# Patient Record
Sex: Female | Born: 1960 | Race: White | Hispanic: No | Marital: Married | Smoking: Former smoker
Health system: Southern US, Community
[De-identification: ages and names within clinical notes are randomized; demographics above are authoritative.]

## PROBLEM LIST (undated history)

## (undated) DIAGNOSIS — C719 Malignant neoplasm of brain, unspecified: Secondary | ICD-10-CM

## (undated) DIAGNOSIS — T451X5A Adverse effect of antineoplastic and immunosuppressive drugs, initial encounter: Secondary | ICD-10-CM

## (undated) DIAGNOSIS — R040 Epistaxis: Secondary | ICD-10-CM

## (undated) DIAGNOSIS — E039 Hypothyroidism, unspecified: Secondary | ICD-10-CM

## (undated) DIAGNOSIS — J340 Abscess, furuncle and carbuncle of nose: Secondary | ICD-10-CM

## (undated) DIAGNOSIS — C712 Malignant neoplasm of temporal lobe: Secondary | ICD-10-CM

## (undated) DIAGNOSIS — I998 Other disorder of circulatory system: Secondary | ICD-10-CM

## (undated) DIAGNOSIS — B59 Pneumocystosis: Secondary | ICD-10-CM

## (undated) DIAGNOSIS — Z7901 Long term (current) use of anticoagulants: Principal | ICD-10-CM

## (undated) DIAGNOSIS — I2699 Other pulmonary embolism without acute cor pulmonale: Secondary | ICD-10-CM

## (undated) DIAGNOSIS — D6959 Other secondary thrombocytopenia: Secondary | ICD-10-CM

## (undated) DIAGNOSIS — D61810 Antineoplastic chemotherapy induced pancytopenia: Secondary | ICD-10-CM

## (undated) HISTORY — DX: Other secondary thrombocytopenia: D69.59

## (undated) HISTORY — DX: Other pulmonary embolism without acute cor pulmonale: I26.99

## (undated) HISTORY — DX: Malignant neoplasm of brain, unspecified: C71.9

## (undated) HISTORY — DX: Adverse effect of antineoplastic and immunosuppressive drugs, initial encounter: T45.1X5A

## (undated) HISTORY — DX: Hypothyroidism, unspecified: E03.9

## (undated) HISTORY — DX: Malignant neoplasm of temporal lobe: C71.2

## (undated) HISTORY — DX: Epistaxis: R04.0

## (undated) HISTORY — DX: Long term (current) use of anticoagulants: Z79.01

## (undated) HISTORY — DX: Pneumocystosis: B59

## (undated) HISTORY — DX: Antineoplastic chemotherapy induced pancytopenia: D61.810

## (undated) HISTORY — PX: TUBAL LIGATION: SHX77

## (undated) HISTORY — DX: Abscess, furuncle and carbuncle of nose: J34.0

## (undated) HISTORY — DX: Other disorder of circulatory system: I99.8

---

## 2006-03-18 ENCOUNTER — Other Ambulatory Visit: Admission: RE | Admit: 2006-03-18 | Discharge: 2006-03-18 | Payer: Self-pay | Admitting: Obstetrics and Gynecology

## 2009-02-14 DIAGNOSIS — C719 Malignant neoplasm of brain, unspecified: Secondary | ICD-10-CM

## 2009-02-14 HISTORY — PX: CRANIOTOMY FOR TEMPORAL LOBECTOMY: SUR342

## 2009-02-14 HISTORY — DX: Malignant neoplasm of brain, unspecified: C71.9

## 2009-02-18 ENCOUNTER — Inpatient Hospital Stay (HOSPITAL_COMMUNITY): Admission: AD | Admit: 2009-02-18 | Discharge: 2009-02-27 | Payer: Self-pay | Admitting: Family Medicine

## 2009-02-20 ENCOUNTER — Encounter (INDEPENDENT_AMBULATORY_CARE_PROVIDER_SITE_OTHER): Payer: Self-pay | Admitting: Neurosurgery

## 2009-02-20 DIAGNOSIS — C712 Malignant neoplasm of temporal lobe: Secondary | ICD-10-CM

## 2009-02-20 HISTORY — DX: Malignant neoplasm of temporal lobe: C71.2

## 2009-03-01 ENCOUNTER — Ambulatory Visit: Admission: RE | Admit: 2009-03-01 | Discharge: 2009-04-05 | Payer: Self-pay | Admitting: Radiation Oncology

## 2009-03-18 ENCOUNTER — Ambulatory Visit: Payer: Self-pay | Admitting: Oncology

## 2009-03-20 LAB — CMP (CANCER CENTER ONLY)
Albumin: 2.7 g/dL — ABNORMAL LOW (ref 3.3–5.5)
CO2: 27 mEq/L (ref 18–33)
Calcium: 8.6 mg/dL (ref 8.0–10.3)
Glucose, Bld: 141 mg/dL — ABNORMAL HIGH (ref 73–118)
Potassium: 3.8 mEq/L (ref 3.3–4.7)
Sodium: 138 mEq/L (ref 128–145)
Total Protein: 6.5 g/dL (ref 6.4–8.1)

## 2009-03-20 LAB — CBC WITH DIFFERENTIAL (CANCER CENTER ONLY)
BASO%: 0.9 % (ref 0.0–2.0)
LYMPH#: 1.3 10*3/uL (ref 0.9–3.3)
LYMPH%: 13.1 % — ABNORMAL LOW (ref 14.0–48.0)
MONO#: 0.5 10*3/uL (ref 0.1–0.9)
Platelets: 355 10*3/uL (ref 145–400)
RDW: 14.9 % — ABNORMAL HIGH (ref 10.5–14.6)
WBC: 9.5 10*3/uL (ref 3.9–10.0)

## 2009-03-20 LAB — LACTATE DEHYDROGENASE: LDH: 193 U/L (ref 94–250)

## 2009-05-16 DIAGNOSIS — I2699 Other pulmonary embolism without acute cor pulmonale: Secondary | ICD-10-CM

## 2009-05-16 DIAGNOSIS — B59 Pneumocystosis: Secondary | ICD-10-CM

## 2009-05-16 HISTORY — DX: Pneumocystosis: B59

## 2009-05-16 HISTORY — DX: Other pulmonary embolism without acute cor pulmonale: I26.99

## 2009-05-24 ENCOUNTER — Ambulatory Visit: Admission: RE | Admit: 2009-05-24 | Discharge: 2009-07-26 | Payer: Self-pay | Admitting: Radiation Oncology

## 2009-05-29 ENCOUNTER — Ambulatory Visit: Payer: Self-pay | Admitting: Oncology

## 2009-05-29 LAB — CBC WITH DIFFERENTIAL/PLATELET
BASO%: 0.3 % (ref 0.0–2.0)
EOS%: 0 % (ref 0.0–7.0)
HGB: 11.3 g/dL — ABNORMAL LOW (ref 11.6–15.9)
MCH: 28.6 pg (ref 25.1–34.0)
MCHC: 32.8 g/dL (ref 31.5–36.0)
MONO%: 8.8 % (ref 0.0–14.0)
RBC: 3.95 10*6/uL (ref 3.70–5.45)
RDW: 20.3 % — ABNORMAL HIGH (ref 11.2–14.5)
lymph#: 2 10*3/uL (ref 0.9–3.3)
nRBC: 2 % — ABNORMAL HIGH (ref 0–0)

## 2009-05-29 LAB — COMPREHENSIVE METABOLIC PANEL
Albumin: 2.9 g/dL — ABNORMAL LOW (ref 3.5–5.2)
BUN: 21 mg/dL (ref 6–23)
Calcium: 9.1 mg/dL (ref 8.4–10.5)
Chloride: 101 mEq/L (ref 96–112)
Creatinine, Ser: 0.86 mg/dL (ref 0.40–1.20)
Glucose, Bld: 76 mg/dL (ref 70–99)
Potassium: 4.8 mEq/L (ref 3.5–5.3)

## 2009-05-29 LAB — PROTIME-INR
INR: 0.9 — ABNORMAL LOW (ref 2.00–3.50)
Protime: 10.8 Seconds (ref 10.6–13.4)

## 2009-05-30 ENCOUNTER — Ambulatory Visit (HOSPITAL_COMMUNITY): Admission: RE | Admit: 2009-05-30 | Discharge: 2009-05-30 | Payer: Self-pay | Admitting: Radiation Oncology

## 2009-05-31 ENCOUNTER — Inpatient Hospital Stay (HOSPITAL_COMMUNITY): Admission: EM | Admit: 2009-05-31 | Discharge: 2009-06-03 | Payer: Self-pay | Admitting: Emergency Medicine

## 2009-06-12 LAB — CBC WITH DIFFERENTIAL/PLATELET
Basophils Absolute: 0 10*3/uL (ref 0.0–0.1)
Eosinophils Absolute: 0 10*3/uL (ref 0.0–0.5)
HGB: 11 g/dL — ABNORMAL LOW (ref 11.6–15.9)
MONO#: 0.7 10*3/uL (ref 0.1–0.9)
NEUT#: 2.7 10*3/uL (ref 1.5–6.5)
RBC: 3.79 10*6/uL (ref 3.70–5.45)
RDW: 19.6 % — ABNORMAL HIGH (ref 11.2–14.5)
WBC: 4.7 10*3/uL (ref 3.9–10.3)
lymph#: 1.2 10*3/uL (ref 0.9–3.3)
nRBC: 3 % — ABNORMAL HIGH (ref 0–0)

## 2009-06-12 LAB — COMPREHENSIVE METABOLIC PANEL
ALT: 41 U/L — ABNORMAL HIGH (ref 0–35)
AST: 13 U/L (ref 0–37)
Calcium: 8.9 mg/dL (ref 8.4–10.5)
Chloride: 93 mEq/L — ABNORMAL LOW (ref 96–112)
Creatinine, Ser: 0.68 mg/dL (ref 0.40–1.20)
Potassium: 4.4 mEq/L (ref 3.5–5.3)
Sodium: 128 mEq/L — ABNORMAL LOW (ref 135–145)
Total Protein: 5.8 g/dL — ABNORMAL LOW (ref 6.0–8.3)

## 2009-06-12 LAB — TECHNOLOGIST REVIEW

## 2009-06-19 LAB — CBC WITH DIFFERENTIAL/PLATELET
BASO%: 0.6 % (ref 0.0–2.0)
Eosinophils Absolute: 0 10*3/uL (ref 0.0–0.5)
HCT: 29.8 % — ABNORMAL LOW (ref 34.8–46.6)
HGB: 10.2 g/dL — ABNORMAL LOW (ref 11.6–15.9)
LYMPH%: 19.9 % (ref 14.0–49.7)
MCHC: 34.3 g/dL (ref 31.5–36.0)
MONO#: 0.4 10*3/uL (ref 0.1–0.9)
NEUT#: 4.6 10*3/uL (ref 1.5–6.5)
NEUT%: 73.4 % (ref 38.4–76.8)
Platelets: 230 10*3/uL (ref 145–400)
WBC: 6.3 10*3/uL (ref 3.9–10.3)
lymph#: 1.3 10*3/uL (ref 0.9–3.3)

## 2009-06-19 LAB — COMPREHENSIVE METABOLIC PANEL
ALT: 46 U/L — ABNORMAL HIGH (ref 0–35)
CO2: 19 mEq/L (ref 19–32)
Calcium: 8.7 mg/dL (ref 8.4–10.5)
Chloride: 104 mEq/L (ref 96–112)
Creatinine, Ser: 0.63 mg/dL (ref 0.40–1.20)
Glucose, Bld: 147 mg/dL — ABNORMAL HIGH (ref 70–99)
Total Protein: 5.7 g/dL — ABNORMAL LOW (ref 6.0–8.3)

## 2009-06-19 LAB — LACTATE DEHYDROGENASE: LDH: 532 U/L — ABNORMAL HIGH (ref 94–250)

## 2009-06-25 LAB — CBC WITH DIFFERENTIAL/PLATELET
Basophils Absolute: 0 10*3/uL (ref 0.0–0.1)
Eosinophils Absolute: 0 10*3/uL (ref 0.0–0.5)
HCT: 31.5 % — ABNORMAL LOW (ref 34.8–46.6)
HGB: 10.2 g/dL — ABNORMAL LOW (ref 11.6–15.9)
MCH: 29.1 pg (ref 25.1–34.0)
MONO#: 0.4 10*3/uL (ref 0.1–0.9)
NEUT#: 4.6 10*3/uL (ref 1.5–6.5)
NEUT%: 72.4 % (ref 38.4–76.8)
RDW: 20.5 % — ABNORMAL HIGH (ref 11.2–14.5)
WBC: 6.3 10*3/uL (ref 3.9–10.3)
lymph#: 1.3 10*3/uL (ref 0.9–3.3)

## 2009-06-25 LAB — COMPREHENSIVE METABOLIC PANEL
Albumin: 3.5 g/dL (ref 3.5–5.2)
Alkaline Phosphatase: 32 U/L — ABNORMAL LOW (ref 39–117)
BUN: 14 mg/dL (ref 6–23)
CO2: 21 mEq/L (ref 19–32)
Calcium: 9.2 mg/dL (ref 8.4–10.5)
Chloride: 104 mEq/L (ref 96–112)
Glucose, Bld: 98 mg/dL (ref 70–99)
Potassium: 4.5 mEq/L (ref 3.5–5.3)
Sodium: 137 mEq/L (ref 135–145)
Total Protein: 5.9 g/dL — ABNORMAL LOW (ref 6.0–8.3)

## 2009-07-01 ENCOUNTER — Ambulatory Visit: Payer: Self-pay | Admitting: Oncology

## 2009-07-03 LAB — COMPREHENSIVE METABOLIC PANEL
ALT: 75 U/L — ABNORMAL HIGH (ref 0–35)
AST: 22 U/L (ref 0–37)
Alkaline Phosphatase: 39 U/L (ref 39–117)
Sodium: 138 mEq/L (ref 135–145)
Total Bilirubin: 0.3 mg/dL (ref 0.3–1.2)
Total Protein: 6 g/dL (ref 6.0–8.3)

## 2009-07-03 LAB — CBC WITH DIFFERENTIAL/PLATELET
BASO%: 1.4 % (ref 0.0–2.0)
Eosinophils Absolute: 0 10*3/uL (ref 0.0–0.5)
LYMPH%: 20.3 % (ref 14.0–49.7)
MCHC: 31.6 g/dL (ref 31.5–36.0)
MCV: 93.4 fL (ref 79.5–101.0)
MONO#: 0.5 10*3/uL (ref 0.1–0.9)
MONO%: 9.1 % (ref 0.0–14.0)
NEUT#: 3.6 10*3/uL (ref 1.5–6.5)
Platelets: 275 10*3/uL (ref 145–400)
RBC: 3.49 10*6/uL — ABNORMAL LOW (ref 3.70–5.45)
RDW: 20.2 % — ABNORMAL HIGH (ref 11.2–14.5)
WBC: 5.2 10*3/uL (ref 3.9–10.3)
nRBC: 12 % — ABNORMAL HIGH (ref 0–0)

## 2009-07-03 LAB — TECHNOLOGIST REVIEW

## 2009-07-10 LAB — COMPREHENSIVE METABOLIC PANEL
AST: 30 U/L (ref 0–37)
Albumin: 3.8 g/dL (ref 3.5–5.2)
Alkaline Phosphatase: 46 U/L (ref 39–117)
BUN: 21 mg/dL (ref 6–23)
Calcium: 8.8 mg/dL (ref 8.4–10.5)
Glucose, Bld: 159 mg/dL — ABNORMAL HIGH (ref 70–99)
Total Protein: 5.9 g/dL — ABNORMAL LOW (ref 6.0–8.3)

## 2009-07-10 LAB — CBC WITH DIFFERENTIAL/PLATELET
BASO%: 0.6 % (ref 0.0–2.0)
HCT: 31.8 % — ABNORMAL LOW (ref 34.8–46.6)
LYMPH%: 17.4 % (ref 14.0–49.7)
MCH: 31.7 pg (ref 25.1–34.0)
MCHC: 33.2 g/dL (ref 31.5–36.0)
MONO#: 0.4 10*3/uL (ref 0.1–0.9)
NEUT%: 74 % (ref 38.4–76.8)
Platelets: 307 10*3/uL (ref 145–400)
WBC: 5.7 10*3/uL (ref 3.9–10.3)

## 2009-07-16 LAB — COMPREHENSIVE METABOLIC PANEL
ALT: 81 U/L — ABNORMAL HIGH (ref 0–35)
Albumin: 3.9 g/dL (ref 3.5–5.2)
CO2: 20 mEq/L (ref 19–32)
Calcium: 9.1 mg/dL (ref 8.4–10.5)
Chloride: 108 mEq/L (ref 96–112)
Creatinine, Ser: 0.59 mg/dL (ref 0.40–1.20)
Potassium: 4 mEq/L (ref 3.5–5.3)
Total Protein: 6.3 g/dL (ref 6.0–8.3)

## 2009-07-16 LAB — CBC WITH DIFFERENTIAL/PLATELET
Eosinophils Absolute: 0 10*3/uL (ref 0.0–0.5)
HCT: 34.5 % — ABNORMAL LOW (ref 34.8–46.6)
LYMPH%: 13.8 % — ABNORMAL LOW (ref 14.0–49.7)
MONO#: 0.6 10*3/uL (ref 0.1–0.9)
NEUT#: 4.8 10*3/uL (ref 1.5–6.5)
Platelets: 323 10*3/uL (ref 145–400)
RBC: 3.46 10*6/uL — ABNORMAL LOW (ref 3.70–5.45)
WBC: 6.3 10*3/uL (ref 3.9–10.3)
nRBC: 5 % — ABNORMAL HIGH (ref 0–0)

## 2009-07-16 LAB — LACTATE DEHYDROGENASE: LDH: 608 U/L — ABNORMAL HIGH (ref 94–250)

## 2009-07-23 LAB — CBC WITH DIFFERENTIAL/PLATELET
BASO%: 0.7 % (ref 0.0–2.0)
Basophils Absolute: 0 10e3/uL (ref 0.0–0.1)
EOS%: 1 % (ref 0.0–7.0)
Eosinophils Absolute: 0.1 10e3/uL (ref 0.0–0.5)
HCT: 33.7 % — ABNORMAL LOW (ref 34.8–46.6)
HGB: 10.5 g/dL — ABNORMAL LOW (ref 11.6–15.9)
LYMPH%: 14.6 % (ref 14.0–49.7)
MCH: 31.3 pg (ref 25.1–34.0)
MCHC: 31.2 g/dL — ABNORMAL LOW (ref 31.5–36.0)
MCV: 100.3 fL (ref 79.5–101.0)
MONO#: 0.7 10e3/uL (ref 0.1–0.9)
MONO%: 12.5 % (ref 0.0–14.0)
NEUT#: 4.1 10e3/uL (ref 1.5–6.5)
NEUT%: 71.2 % (ref 38.4–76.8)
Platelets: 422 10e3/uL — ABNORMAL HIGH (ref 145–400)
RBC: 3.36 10e6/uL — ABNORMAL LOW (ref 3.70–5.45)
RDW: 20.2 % — ABNORMAL HIGH (ref 11.2–14.5)
WBC: 5.8 10e3/uL (ref 3.9–10.3)
lymph#: 0.8 10e3/uL — ABNORMAL LOW (ref 0.9–3.3)
nRBC: 1 % — ABNORMAL HIGH (ref 0–0)

## 2009-07-23 LAB — COMPREHENSIVE METABOLIC PANEL WITH GFR
ALT: 59 U/L — ABNORMAL HIGH (ref 0–35)
AST: 24 U/L (ref 0–37)
Albumin: 3.8 g/dL (ref 3.5–5.2)
Alkaline Phosphatase: 57 U/L (ref 39–117)
BUN: 20 mg/dL (ref 6–23)
CO2: 19 meq/L (ref 19–32)
Calcium: 9.3 mg/dL (ref 8.4–10.5)
Chloride: 110 meq/L (ref 96–112)
Creatinine, Ser: 0.57 mg/dL (ref 0.40–1.20)
Glucose, Bld: 103 mg/dL — ABNORMAL HIGH (ref 70–99)
Potassium: 4.4 meq/L (ref 3.5–5.3)
Sodium: 142 meq/L (ref 135–145)
Total Bilirubin: 0.4 mg/dL (ref 0.3–1.2)
Total Protein: 6.2 g/dL (ref 6.0–8.3)

## 2009-07-23 LAB — LACTATE DEHYDROGENASE: LDH: 508 U/L — ABNORMAL HIGH (ref 94–250)

## 2009-08-26 ENCOUNTER — Ambulatory Visit: Payer: Self-pay | Admitting: Oncology

## 2009-08-28 LAB — CBC WITH DIFFERENTIAL/PLATELET
BASO%: 0 % (ref 0.0–2.0)
EOS%: 8.4 % — ABNORMAL HIGH (ref 0.0–7.0)
MCH: 30.6 pg (ref 25.1–34.0)
MCHC: 33 g/dL (ref 31.5–36.0)
MONO#: 0.9 10*3/uL (ref 0.1–0.9)
NEUT%: 61 % (ref 38.4–76.8)
RBC: 3.29 10*6/uL — ABNORMAL LOW (ref 3.70–5.45)
WBC: 4.8 10*3/uL (ref 3.9–10.3)
lymph#: 0.6 10*3/uL — ABNORMAL LOW (ref 0.9–3.3)

## 2009-09-23 ENCOUNTER — Ambulatory Visit: Payer: Self-pay | Admitting: Oncology

## 2009-09-25 LAB — COMPREHENSIVE METABOLIC PANEL
AST: 32 U/L (ref 0–37)
Albumin: 4.2 g/dL (ref 3.5–5.2)
Alkaline Phosphatase: 65 U/L (ref 39–117)
BUN: 9 mg/dL (ref 6–23)
Potassium: 4 mEq/L (ref 3.5–5.3)
Sodium: 139 mEq/L (ref 135–145)
Total Bilirubin: 0.2 mg/dL — ABNORMAL LOW (ref 0.3–1.2)

## 2009-09-25 LAB — CBC WITH DIFFERENTIAL/PLATELET
Basophils Absolute: 0 10*3/uL (ref 0.0–0.1)
EOS%: 4.6 % (ref 0.0–7.0)
LYMPH%: 18 % (ref 14.0–49.7)
MCH: 27.3 pg (ref 25.1–34.0)
MCV: 84.6 fL (ref 79.5–101.0)
MONO%: 23.7 % — ABNORMAL HIGH (ref 0.0–14.0)
RBC: 4.03 10*6/uL (ref 3.70–5.45)
RDW: 15.8 % — ABNORMAL HIGH (ref 11.2–14.5)

## 2009-10-02 LAB — CBC WITH DIFFERENTIAL/PLATELET
Basophils Absolute: 0 10*3/uL (ref 0.0–0.1)
Eosinophils Absolute: 0.1 10*3/uL (ref 0.0–0.5)
HCT: 32.3 % — ABNORMAL LOW (ref 34.8–46.6)
LYMPH%: 23.9 % (ref 14.0–49.7)
MCV: 82.6 fL (ref 79.5–101.0)
MONO#: 0.7 10*3/uL (ref 0.1–0.9)
MONO%: 17.6 % — ABNORMAL HIGH (ref 0.0–14.0)
NEUT#: 2.2 10*3/uL (ref 1.5–6.5)
NEUT%: 55.4 % (ref 38.4–76.8)
Platelets: 343 10*3/uL (ref 145–400)
RBC: 3.91 10*6/uL (ref 3.70–5.45)

## 2009-10-08 LAB — CBC WITH DIFFERENTIAL/PLATELET
BASO%: 0.3 % (ref 0.0–2.0)
EOS%: 2.2 % (ref 0.0–7.0)
HCT: 31.9 % — ABNORMAL LOW (ref 34.8–46.6)
LYMPH%: 26.5 % (ref 14.0–49.7)
MCH: 27.5 pg (ref 25.1–34.0)
MCHC: 33.2 g/dL (ref 31.5–36.0)
MCV: 82.7 fL (ref 79.5–101.0)
MONO%: 19.3 % — ABNORMAL HIGH (ref 0.0–14.0)
NEUT%: 51.7 % (ref 38.4–76.8)
Platelets: 509 10*3/uL — ABNORMAL HIGH (ref 145–400)
RBC: 3.86 10*6/uL (ref 3.70–5.45)
WBC: 5.3 10*3/uL (ref 3.9–10.3)

## 2009-10-21 ENCOUNTER — Ambulatory Visit: Payer: Self-pay | Admitting: Oncology

## 2009-10-23 LAB — COMPREHENSIVE METABOLIC PANEL
ALT: 17 U/L (ref 0–35)
Albumin: 4.3 g/dL (ref 3.5–5.2)
Alkaline Phosphatase: 64 U/L (ref 39–117)
Potassium: 4.4 mEq/L (ref 3.5–5.3)
Sodium: 135 mEq/L (ref 135–145)
Total Bilirubin: 0.3 mg/dL (ref 0.3–1.2)
Total Protein: 6.8 g/dL (ref 6.0–8.3)

## 2009-10-23 LAB — CBC WITH DIFFERENTIAL/PLATELET
EOS%: 2.7 % (ref 0.0–7.0)
Eosinophils Absolute: 0.1 10*3/uL (ref 0.0–0.5)
HGB: 10.3 g/dL — ABNORMAL LOW (ref 11.6–15.9)
MCH: 26.5 pg (ref 25.1–34.0)
MCV: 85.6 fL (ref 79.5–101.0)
MONO%: 25.6 % — ABNORMAL HIGH (ref 0.0–14.0)
NEUT#: 2.2 10*3/uL (ref 1.5–6.5)
RBC: 3.89 10*6/uL (ref 3.70–5.45)
RDW: 21 % — ABNORMAL HIGH (ref 11.2–14.5)
lymph#: 0.7 10*3/uL — ABNORMAL LOW (ref 0.9–3.3)
nRBC: 0 % (ref 0–0)

## 2009-10-31 LAB — CBC WITH DIFFERENTIAL/PLATELET
BASO%: 0.3 % (ref 0.0–2.0)
Basophils Absolute: 0 10*3/uL (ref 0.0–0.1)
EOS%: 3.5 % (ref 0.0–7.0)
HCT: 31.4 % — ABNORMAL LOW (ref 34.8–46.6)
HGB: 9.6 g/dL — ABNORMAL LOW (ref 11.6–15.9)
MCH: 26.4 pg (ref 25.1–34.0)
MCHC: 30.6 g/dL — ABNORMAL LOW (ref 31.5–36.0)
MCV: 86.3 fL (ref 79.5–101.0)
MONO%: 26.7 % — ABNORMAL HIGH (ref 0.0–14.0)
NEUT%: 53.9 % (ref 38.4–76.8)
lymph#: 0.6 10*3/uL — ABNORMAL LOW (ref 0.9–3.3)

## 2009-11-14 LAB — TSH: TSH: 0.126 u[IU]/mL — ABNORMAL LOW (ref 0.350–4.500)

## 2009-11-14 LAB — CBC WITH DIFFERENTIAL/PLATELET
BASO%: 0.3 % (ref 0.0–2.0)
EOS%: 3.3 % (ref 0.0–7.0)
HCT: 32 % — ABNORMAL LOW (ref 34.8–46.6)
LYMPH%: 25 % (ref 14.0–49.7)
MCH: 27.1 pg (ref 25.1–34.0)
MCHC: 31.3 g/dL — ABNORMAL LOW (ref 31.5–36.0)
MCV: 86.7 fL (ref 79.5–101.0)
MONO%: 17 % — ABNORMAL HIGH (ref 0.0–14.0)
NEUT%: 54.4 % (ref 38.4–76.8)
Platelets: 416 10*3/uL — ABNORMAL HIGH (ref 145–400)
RBC: 3.69 10*6/uL — ABNORMAL LOW (ref 3.70–5.45)
WBC: 3.6 10*3/uL — ABNORMAL LOW (ref 3.9–10.3)

## 2009-11-19 LAB — COMPREHENSIVE METABOLIC PANEL
ALT: 20 U/L (ref 0–35)
AST: 22 U/L (ref 0–37)
Albumin: 4.1 g/dL (ref 3.5–5.2)
Alkaline Phosphatase: 61 U/L (ref 39–117)
Glucose, Bld: 86 mg/dL (ref 70–99)
Potassium: 4.3 mEq/L (ref 3.5–5.3)
Sodium: 141 mEq/L (ref 135–145)
Total Bilirubin: 0.4 mg/dL (ref 0.3–1.2)
Total Protein: 6.2 g/dL (ref 6.0–8.3)

## 2009-11-19 LAB — MORPHOLOGY: PLT EST: ADEQUATE

## 2009-11-19 LAB — CBC WITH DIFFERENTIAL/PLATELET
Basophils Absolute: 0 10*3/uL (ref 0.0–0.1)
EOS%: 5.1 % (ref 0.0–7.0)
HGB: 10.1 g/dL — ABNORMAL LOW (ref 11.6–15.9)
MCH: 27.2 pg (ref 25.1–34.0)
MCV: 87.3 fL (ref 79.5–101.0)
MONO%: 30.2 % — ABNORMAL HIGH (ref 0.0–14.0)
NEUT#: 1.5 10*3/uL (ref 1.5–6.5)
RBC: 3.71 10*6/uL (ref 3.70–5.45)
RDW: 21.6 % — ABNORMAL HIGH (ref 11.2–14.5)
lymph#: 0.5 10*3/uL — ABNORMAL LOW (ref 0.9–3.3)
nRBC: 0 % (ref 0–0)

## 2009-11-26 ENCOUNTER — Ambulatory Visit: Payer: Self-pay | Admitting: Oncology

## 2009-11-28 LAB — CBC WITH DIFFERENTIAL/PLATELET
BASO%: 0.5 % (ref 0.0–2.0)
EOS%: 4.4 % (ref 0.0–7.0)
HCT: 33.7 % — ABNORMAL LOW (ref 34.8–46.6)
LYMPH%: 23.9 % (ref 14.0–49.7)
MCH: 28.2 pg (ref 25.1–34.0)
MCHC: 31.2 g/dL — ABNORMAL LOW (ref 31.5–36.0)
MCV: 90.3 fL (ref 79.5–101.0)
MONO#: 0.7 10*3/uL (ref 0.1–0.9)
MONO%: 17.9 % — ABNORMAL HIGH (ref 0.0–14.0)
NEUT%: 53.3 % (ref 38.4–76.8)
Platelets: 358 10*3/uL (ref 145–400)
RBC: 3.73 10*6/uL (ref 3.70–5.45)
WBC: 3.6 10*3/uL — ABNORMAL LOW (ref 3.9–10.3)
nRBC: 0 % (ref 0–0)

## 2009-12-12 LAB — CBC WITH DIFFERENTIAL/PLATELET
EOS%: 3.5 % (ref 0.0–7.0)
Eosinophils Absolute: 0.1 10*3/uL (ref 0.0–0.5)
LYMPH%: 26 % (ref 14.0–49.7)
MCH: 29.2 pg (ref 25.1–34.0)
MCV: 95 fL (ref 79.5–101.0)
MONO%: 19.7 % — ABNORMAL HIGH (ref 0.0–14.0)
NEUT#: 1.3 10*3/uL — ABNORMAL LOW (ref 1.5–6.5)
Platelets: 362 10*3/uL (ref 145–400)
RBC: 3.63 10*6/uL — ABNORMAL LOW (ref 3.70–5.45)
RDW: 17.8 % — ABNORMAL HIGH (ref 11.2–14.5)
nRBC: 0 % (ref 0–0)

## 2009-12-24 LAB — CBC WITH DIFFERENTIAL/PLATELET
BASO%: 0.3 % (ref 0.0–2.0)
EOS%: 4 % (ref 0.0–7.0)
MCH: 30.2 pg (ref 25.1–34.0)
MCHC: 31.8 g/dL (ref 31.5–36.0)
MCV: 94.9 fL (ref 79.5–101.0)
MONO%: 21.9 % — ABNORMAL HIGH (ref 0.0–14.0)
RBC: 3.51 10*6/uL — ABNORMAL LOW (ref 3.70–5.45)
RDW: 16.6 % — ABNORMAL HIGH (ref 11.2–14.5)

## 2009-12-24 LAB — COMPREHENSIVE METABOLIC PANEL
AST: 21 U/L (ref 0–37)
Albumin: 4.3 g/dL (ref 3.5–5.2)
Alkaline Phosphatase: 67 U/L (ref 39–117)
BUN: 13 mg/dL (ref 6–23)
Potassium: 4.2 mEq/L (ref 3.5–5.3)
Sodium: 138 mEq/L (ref 135–145)
Total Protein: 6.5 g/dL (ref 6.0–8.3)

## 2009-12-26 ENCOUNTER — Ambulatory Visit: Payer: Self-pay | Admitting: Oncology

## 2009-12-31 LAB — CBC WITH DIFFERENTIAL/PLATELET
BASO%: 0.3 % (ref 0.0–2.0)
HCT: 32.5 % — ABNORMAL LOW (ref 34.8–46.6)
LYMPH%: 33.7 % (ref 14.0–49.7)
MCHC: 32.3 g/dL (ref 31.5–36.0)
MONO#: 0.7 10*3/uL (ref 0.1–0.9)
NEUT%: 43.5 % (ref 38.4–76.8)
Platelets: 284 10*3/uL (ref 145–400)
WBC: 3.5 10*3/uL — ABNORMAL LOW (ref 3.9–10.3)

## 2010-01-09 LAB — CBC WITH DIFFERENTIAL/PLATELET
BASO%: 0.6 % (ref 0.0–2.0)
EOS%: 2.8 % (ref 0.0–7.0)
HCT: 34.9 % (ref 34.8–46.6)
LYMPH%: 29.6 % (ref 14.0–49.7)
MCH: 31.4 pg (ref 25.1–34.0)
MCHC: 32.1 g/dL (ref 31.5–36.0)
MCV: 97.8 fL (ref 79.5–101.0)
MONO%: 20.4 % — ABNORMAL HIGH (ref 0.0–14.0)
NEUT%: 46.6 % (ref 38.4–76.8)
Platelets: 266 10*3/uL (ref 145–400)

## 2010-01-21 LAB — COMPREHENSIVE METABOLIC PANEL
ALT: 17 U/L (ref 0–35)
Albumin: 4.5 g/dL (ref 3.5–5.2)
CO2: 21 mEq/L (ref 19–32)
Chloride: 103 mEq/L (ref 96–112)
Potassium: 4.6 mEq/L (ref 3.5–5.3)
Sodium: 138 mEq/L (ref 135–145)
Total Bilirubin: 0.5 mg/dL (ref 0.3–1.2)
Total Protein: 7 g/dL (ref 6.0–8.3)

## 2010-01-21 LAB — CBC WITH DIFFERENTIAL/PLATELET
BASO%: 0.3 % (ref 0.0–2.0)
LYMPH%: 26.2 % (ref 14.0–49.7)
MCHC: 33.7 g/dL (ref 31.5–36.0)
MONO#: 0.8 10*3/uL (ref 0.1–0.9)
NEUT#: 2.2 10*3/uL (ref 1.5–6.5)
RBC: 3.32 10*6/uL — ABNORMAL LOW (ref 3.70–5.45)
RDW: 17.7 % — ABNORMAL HIGH (ref 11.2–14.5)
WBC: 4.4 10*3/uL (ref 3.9–10.3)
lymph#: 1.1 10*3/uL (ref 0.9–3.3)

## 2010-01-21 LAB — LACTATE DEHYDROGENASE: LDH: 250 U/L (ref 94–250)

## 2010-02-04 ENCOUNTER — Ambulatory Visit: Payer: Self-pay | Admitting: Oncology

## 2010-02-06 LAB — CBC WITH DIFFERENTIAL/PLATELET
BASO%: 0.6 % (ref 0.0–2.0)
LYMPH%: 25.9 % (ref 14.0–49.7)
MCHC: 32.1 g/dL (ref 31.5–36.0)
MCV: 104.3 fL — ABNORMAL HIGH (ref 79.5–101.0)
MONO%: 13.8 % (ref 0.0–14.0)
Platelets: 298 10*3/uL (ref 145–400)
RBC: 3.26 10*6/uL — ABNORMAL LOW (ref 3.70–5.45)
RDW: 15.3 % — ABNORMAL HIGH (ref 11.2–14.5)
WBC: 3.6 10*3/uL — ABNORMAL LOW (ref 3.9–10.3)
nRBC: 0 % (ref 0–0)

## 2010-02-20 LAB — CBC WITH DIFFERENTIAL/PLATELET
BASO%: 0.2 % (ref 0.0–2.0)
Eosinophils Absolute: 0.1 10*3/uL (ref 0.0–0.5)
HCT: 34 % — ABNORMAL LOW (ref 34.8–46.6)
HGB: 11.2 g/dL — ABNORMAL LOW (ref 11.6–15.9)
LYMPH%: 31.9 % (ref 14.0–49.7)
MCHC: 32.9 g/dL (ref 31.5–36.0)
MONO#: 0.7 10*3/uL (ref 0.1–0.9)
NEUT#: 2 10*3/uL (ref 1.5–6.5)
NEUT%: 47.6 % (ref 38.4–76.8)
Platelets: 249 10*3/uL (ref 145–400)
WBC: 4.1 10*3/uL (ref 3.9–10.3)
lymph#: 1.3 10*3/uL (ref 0.9–3.3)

## 2010-02-20 LAB — UA PROTEIN, DIPSTICK - CHCC: Protein, Urine: NEGATIVE mg/dL

## 2010-02-25 LAB — COMPREHENSIVE METABOLIC PANEL
ALT: 16 U/L (ref 0–35)
AST: 17 U/L (ref 0–37)
Albumin: 4.4 g/dL (ref 3.5–5.2)
BUN: 13 mg/dL (ref 6–23)
CO2: 22 mEq/L (ref 19–32)
Calcium: 9.6 mg/dL (ref 8.4–10.5)
Chloride: 107 mEq/L (ref 96–112)
Creatinine, Ser: 0.59 mg/dL (ref 0.40–1.20)
Potassium: 4.1 mEq/L (ref 3.5–5.3)

## 2010-02-25 LAB — LACTATE DEHYDROGENASE: LDH: 247 U/L (ref 94–250)

## 2010-03-06 ENCOUNTER — Ambulatory Visit: Payer: Self-pay | Admitting: Oncology

## 2010-03-06 LAB — CBC WITH DIFFERENTIAL/PLATELET
BASO%: 0.5 % (ref 0.0–2.0)
LYMPH%: 36.8 % (ref 14.0–49.7)
MCHC: 33.6 g/dL (ref 31.5–36.0)
MCV: 105.8 fL — ABNORMAL HIGH (ref 79.5–101.0)
MONO%: 18.9 % — ABNORMAL HIGH (ref 0.0–14.0)
Platelets: 278 10*3/uL (ref 145–400)
RBC: 3.17 10*6/uL — ABNORMAL LOW (ref 3.70–5.45)
RDW: 15.4 % — ABNORMAL HIGH (ref 11.2–14.5)
WBC: 3.4 10*3/uL — ABNORMAL LOW (ref 3.9–10.3)

## 2010-03-20 LAB — CBC WITH DIFFERENTIAL/PLATELET
Basophils Absolute: 0 10*3/uL (ref 0.0–0.1)
Eosinophils Absolute: 0.1 10*3/uL (ref 0.0–0.5)
HCT: 33.4 % — ABNORMAL LOW (ref 34.8–46.6)
HGB: 11 g/dL — ABNORMAL LOW (ref 11.6–15.9)
MCH: 34 pg (ref 25.1–34.0)
MONO#: 0.7 10*3/uL (ref 0.1–0.9)
NEUT#: 1.6 10*3/uL (ref 1.5–6.5)
NEUT%: 41.6 % (ref 38.4–76.8)
RDW: 14.2 % (ref 11.2–14.5)
lymph#: 1.4 10*3/uL (ref 0.9–3.3)

## 2010-03-20 LAB — UA PROTEIN, DIPSTICK - CHCC: Protein, Urine: NEGATIVE mg/dL

## 2010-03-25 LAB — CBC WITH DIFFERENTIAL/PLATELET
BASO%: 0.3 % (ref 0.0–2.0)
Eosinophils Absolute: 0.1 10*3/uL (ref 0.0–0.5)
HCT: 33.2 % — ABNORMAL LOW (ref 34.8–46.6)
HGB: 11.3 g/dL — ABNORMAL LOW (ref 11.6–15.9)
LYMPH%: 32.4 % (ref 14.0–49.7)
MONO#: 0.6 10*3/uL (ref 0.1–0.9)
NEUT#: 1.9 10*3/uL (ref 1.5–6.5)
NEUT%: 49.4 % (ref 38.4–76.8)
Platelets: 303 10*3/uL (ref 145–400)
WBC: 3.9 10*3/uL (ref 3.9–10.3)
lymph#: 1.3 10*3/uL (ref 0.9–3.3)

## 2010-03-25 LAB — COMPREHENSIVE METABOLIC PANEL
ALT: 17 U/L (ref 0–35)
CO2: 22 mEq/L (ref 19–32)
Calcium: 9.5 mg/dL (ref 8.4–10.5)
Chloride: 105 mEq/L (ref 96–112)
Creatinine, Ser: 0.58 mg/dL (ref 0.40–1.20)
Glucose, Bld: 105 mg/dL — ABNORMAL HIGH (ref 70–99)
Total Bilirubin: 0.5 mg/dL (ref 0.3–1.2)
Total Protein: 7.4 g/dL (ref 6.0–8.3)

## 2010-03-25 LAB — LACTATE DEHYDROGENASE: LDH: 239 U/L (ref 94–250)

## 2010-04-03 LAB — COMPREHENSIVE METABOLIC PANEL
ALT: 15 U/L (ref 0–35)
AST: 12 U/L (ref 0–37)
Albumin: 3.9 g/dL (ref 3.5–5.2)
Alkaline Phosphatase: 57 U/L (ref 39–117)
Calcium: 8.6 mg/dL (ref 8.4–10.5)
Chloride: 104 mEq/L (ref 96–112)
Potassium: 4.1 mEq/L (ref 3.5–5.3)
Sodium: 136 mEq/L (ref 135–145)
Total Protein: 6.2 g/dL (ref 6.0–8.3)

## 2010-04-03 LAB — CBC WITH DIFFERENTIAL/PLATELET
BASO%: 0.6 % (ref 0.0–2.0)
EOS%: 2.2 % (ref 0.0–7.0)
HCT: 32.5 % — ABNORMAL LOW (ref 34.8–46.6)
HGB: 10.8 g/dL — ABNORMAL LOW (ref 11.6–15.9)
MCH: 34.4 pg — ABNORMAL HIGH (ref 25.1–34.0)
MCHC: 33.2 g/dL (ref 31.5–36.0)
MONO#: 0.6 10*3/uL (ref 0.1–0.9)
RDW: 14.3 % (ref 11.2–14.5)
WBC: 3.6 10*3/uL — ABNORMAL LOW (ref 3.9–10.3)
lymph#: 1.3 10*3/uL (ref 0.9–3.3)

## 2010-04-07 IMAGING — CT CT HEAD W/O CM
1 series · 16 of 30 positions shown, 20 images · non-contrast
Comparison: 02/25/2009

CLINICAL DATA: Weakness and hypotension.  History of brain tumor
resection.

CT HEAD WITHOUT CONTRAST
TECHNIQUE: Contiguous axial images were obtained from the base of
the skull through the vertex without contrast.

[Series 2: head_seq 4.5 h37s st · axial · 0.43mm/px · z∈[-111,+15]mm · 16 of 32 slices shown, 20 images]
[im 2/32  brain]
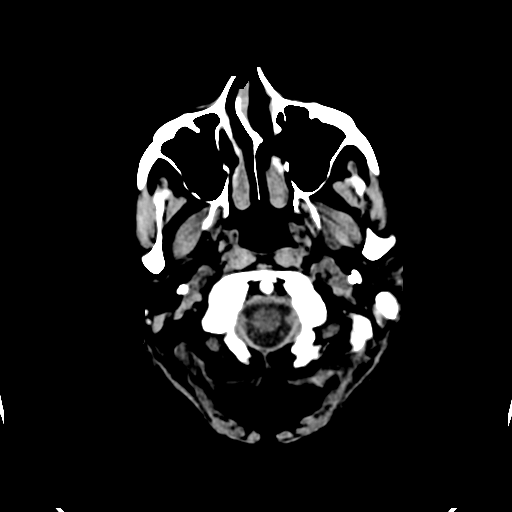
[im 2/32  bone]
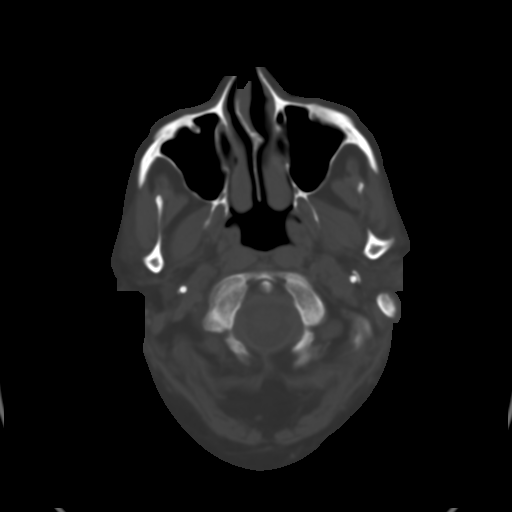
[im 4/32  brain]
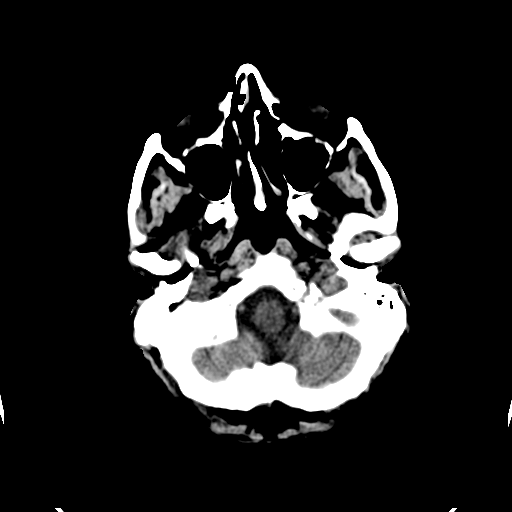
[im 6/32  brain]
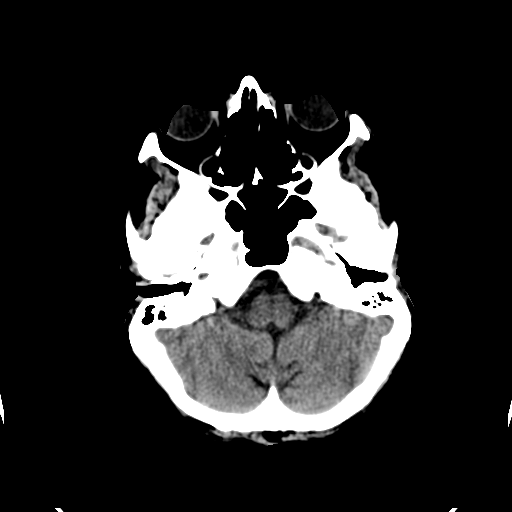
[im 8/32  brain]
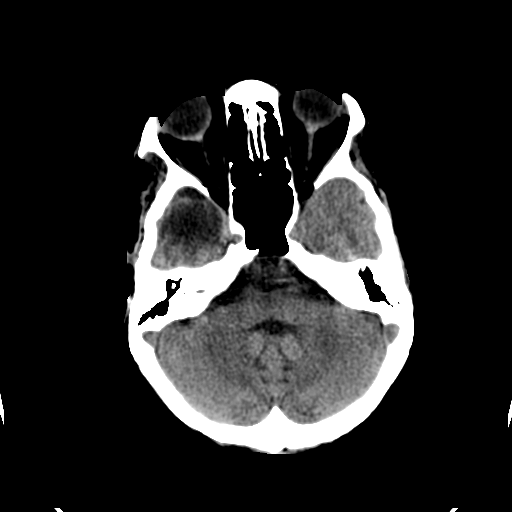
[im 9/32  brain]
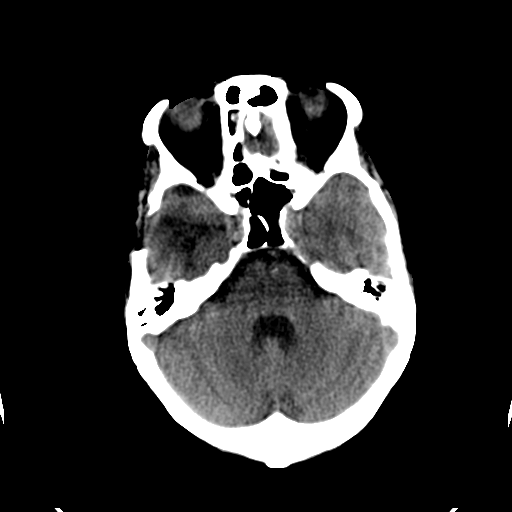
[im 9/32  bone]
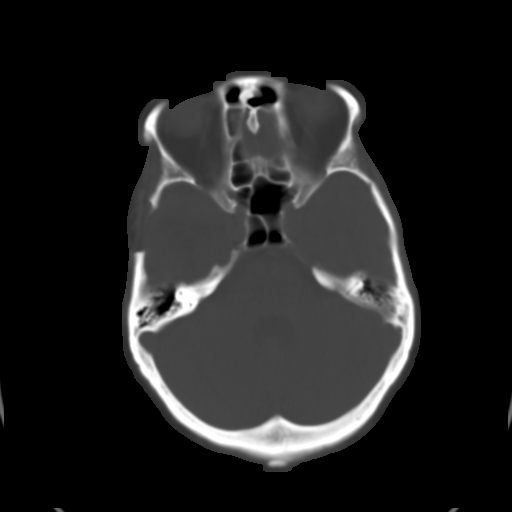
[im 11/32  brain]
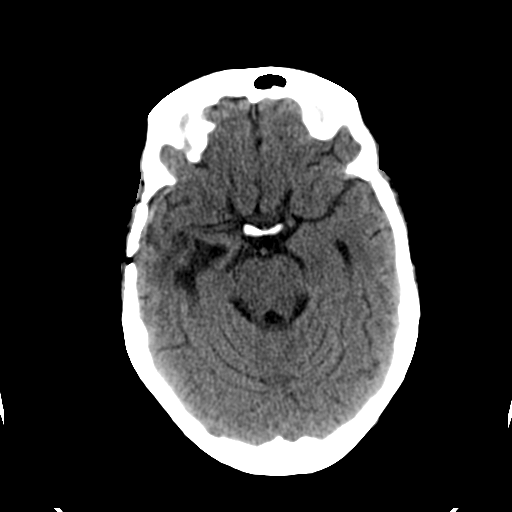
[im 13/32  brain]
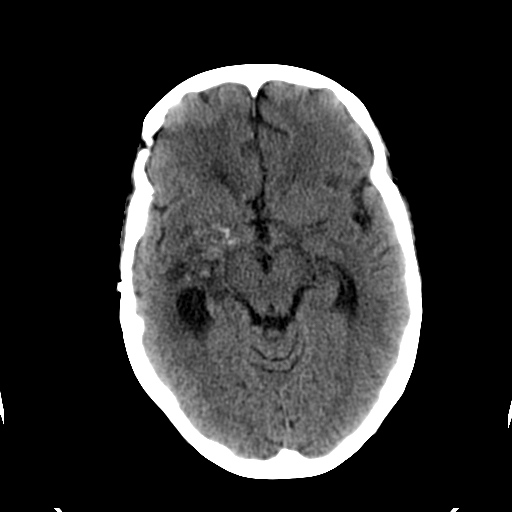
[im 15/32  brain]
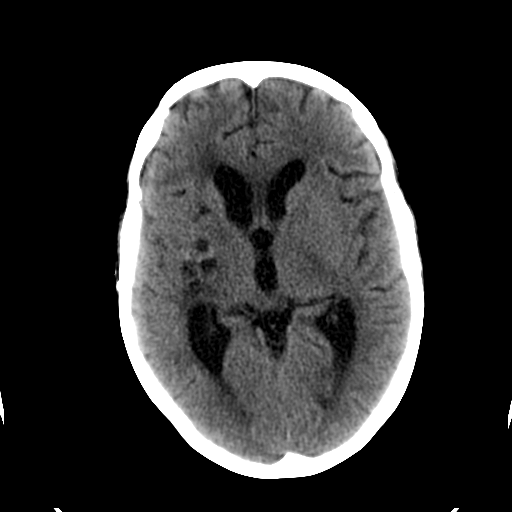
[im 17/32  brain]
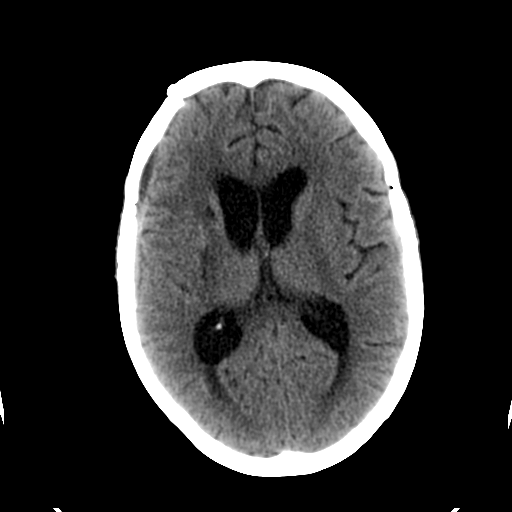
[im 17/32  bone]
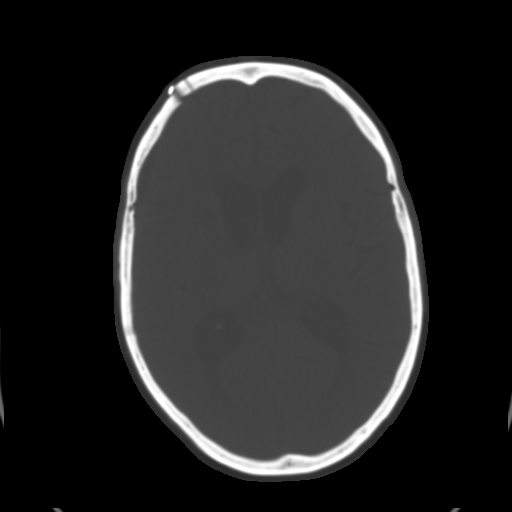
[im 19/32  brain]
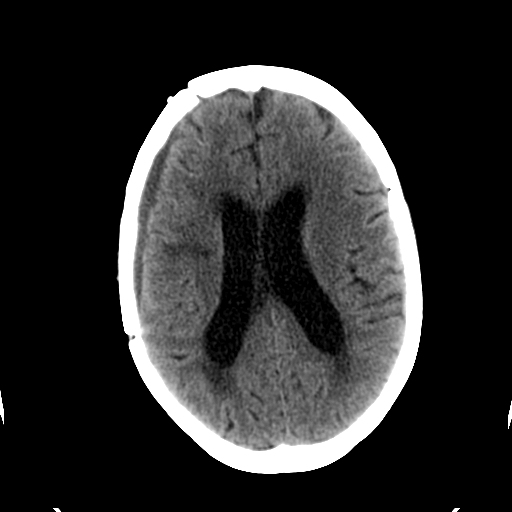
[im 21/32  brain]
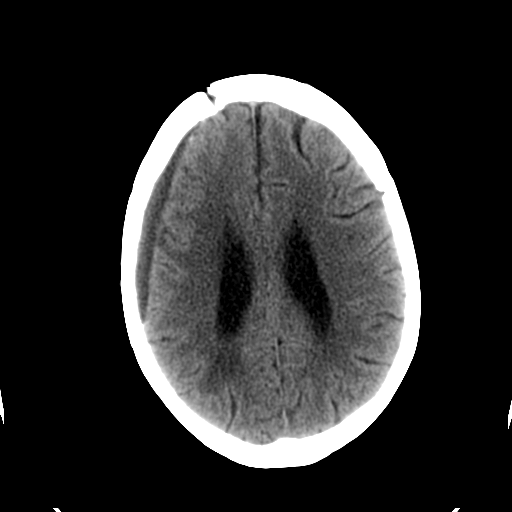
[im 23/32  brain]
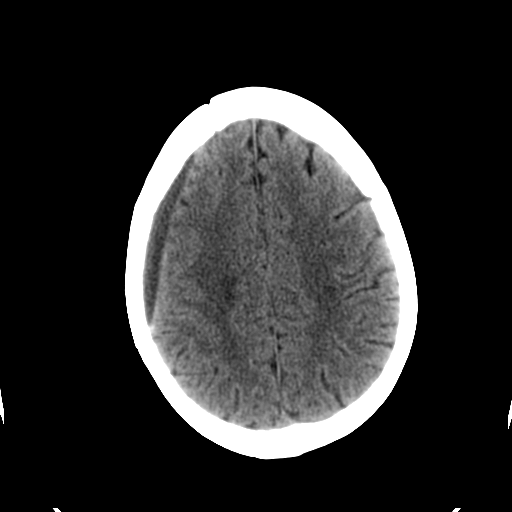
[im 24/32  brain]
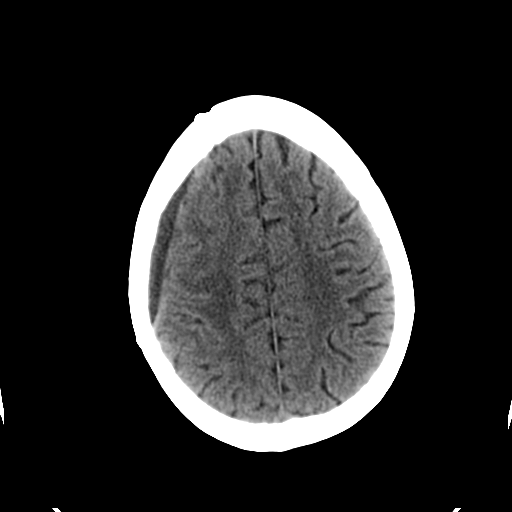
[im 24/32  bone]
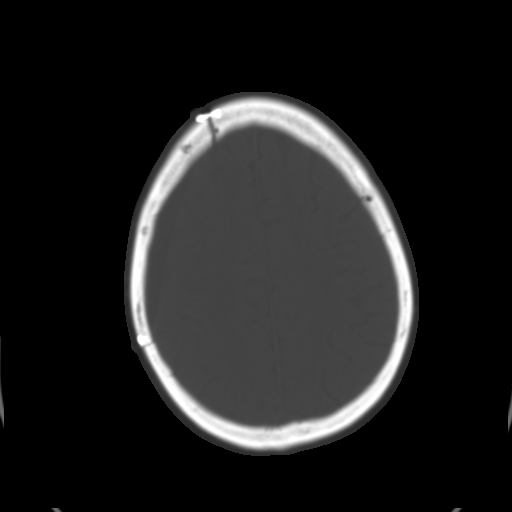
[im 26/32  brain]
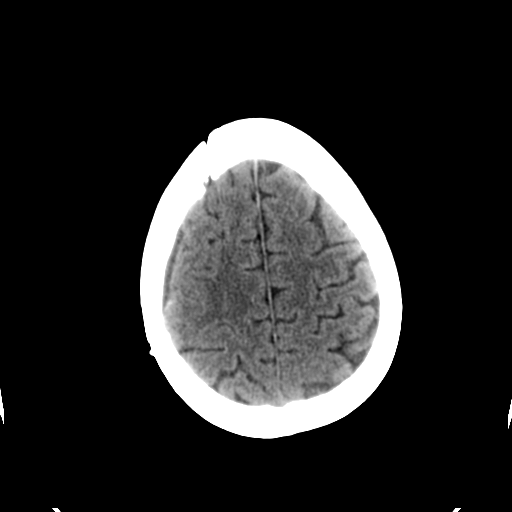
[im 28/32  brain]
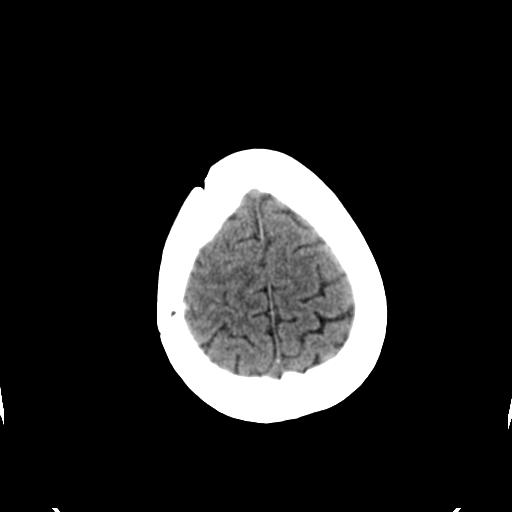
[im 30/32  brain]
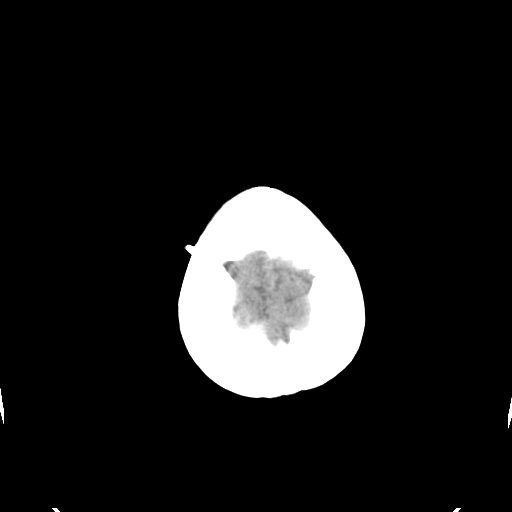

[16 of 30 positions shown; findings below may reference images not displayed]

FINDINGS: Right frontoparietal temporal craniotomy again identified
with a small chronic right subdural postoperative collection
measuring 7 mm.
Encephalomalacia within the right temporal lobe noted.
Heterogeneity with cystic spaces in the right basil ganglia is
again noted measuring 2.1 x 2.9 cm (image 13) but appears slightly
decreased in size since the prior study.

Mild ventriculomegaly is noted.
No evidence of midline shift, hemorrhage, acute infarction, or new
mass lesion.
No acute bony abnormalities are identified.
IMPRESSION: Postoperative changes as described with slightly decreased size of
complex solid and cystic area within the right basil ganglia.

Mild ventriculomegaly, probably not clinically significant.

No evidence of other acute intracranial abnormality.

## 2010-04-07 IMAGING — CR DG CHEST 1V PORT
1 series · 1 of 1 positions shown · non-contrast
Comparison: 05/30/2009.

CLINICAL DATA: Syncope.

PORTABLE CHEST - 1 VIEW

[view not recorded]
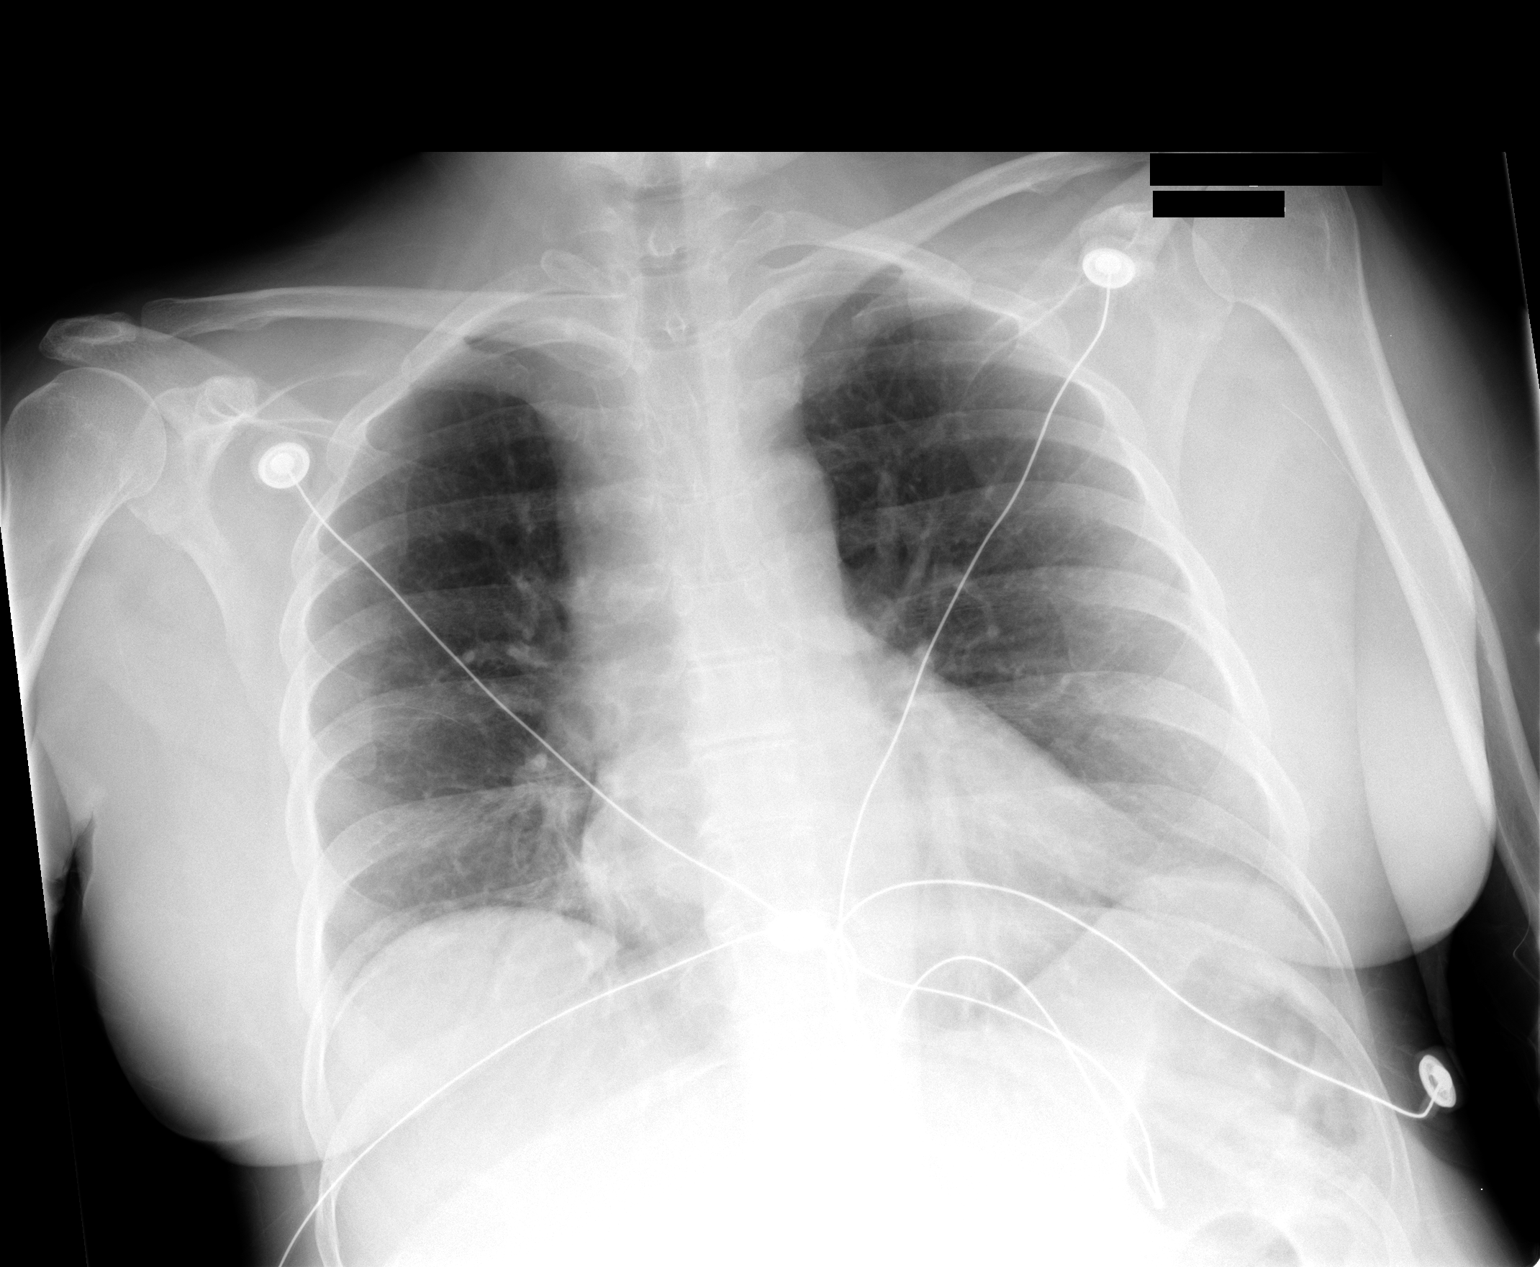

[1 of 1 positions shown; findings below may reference images not displayed]

FINDINGS: Mild right lower lobe atelectasis/infiltrate, increased
from the prior study.  There is no heart failure or pneumonia.
There is no effusion.

Left arm PICC line tip is unchanged with  the tip overlying the
left axillary vein.
IMPRESSION: Mild right lower lobe atelectasis/infiltrate.

PICC line in the left axillary vein.

## 2010-04-08 IMAGING — US US ABDOMEN COMPLETE
1 series · 14 of 25 positions shown · non-contrast
Comparison: None

CLINICAL DATA: Elevated LFTs.

ABDOMINAL ULTRASOUND COMPLETE

[Series 1: us abdomen complete · 0.30mm/px · 14 of 81 slices shown]
[im 1/81]
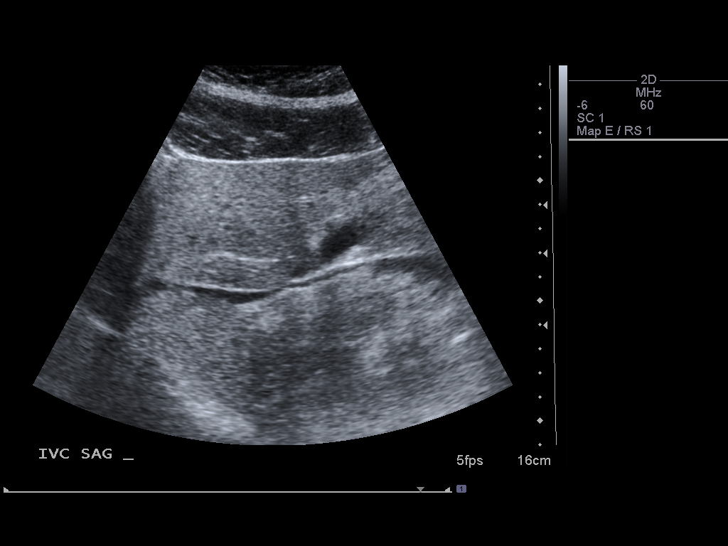
[im 7/81]
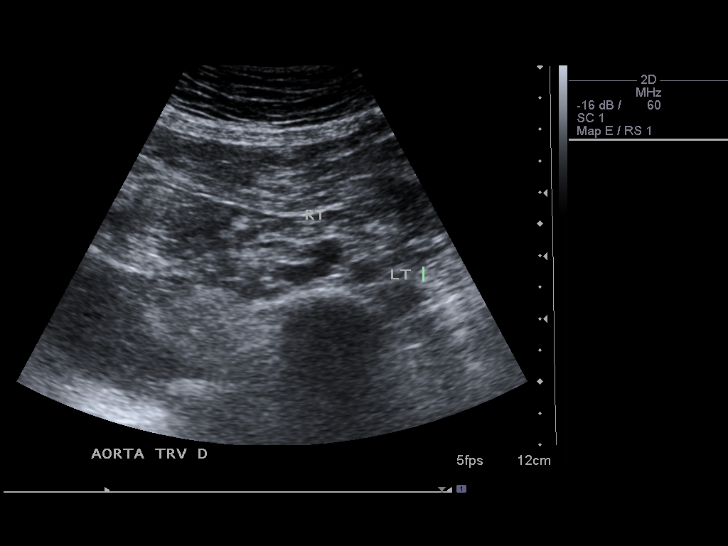
[im 14/81]
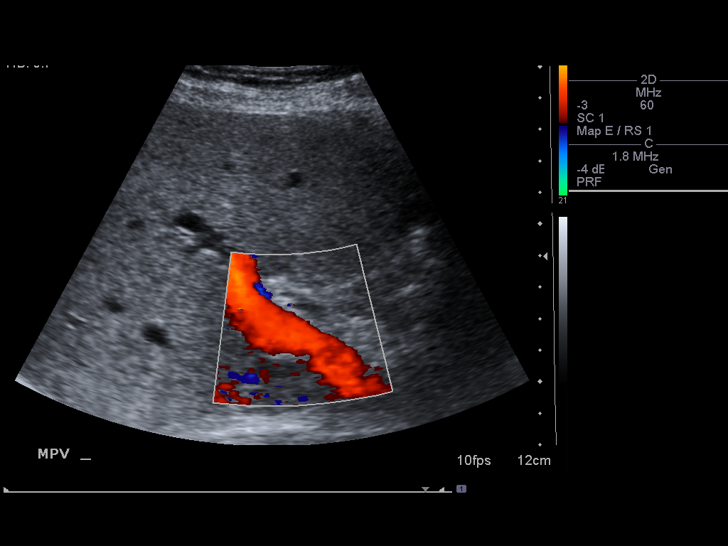
[im 21/81]
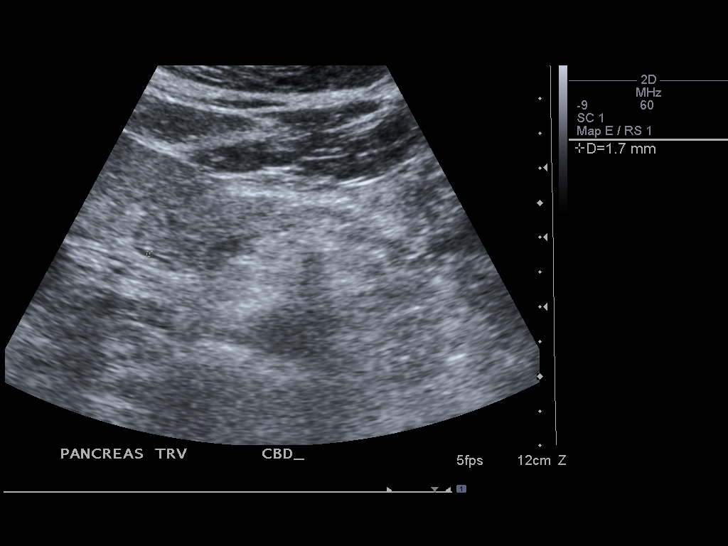
[im 27/81]
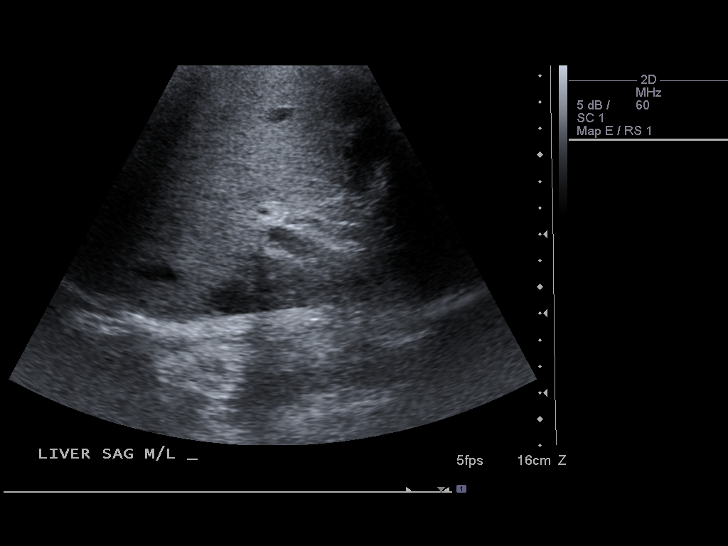
[im 31/81]
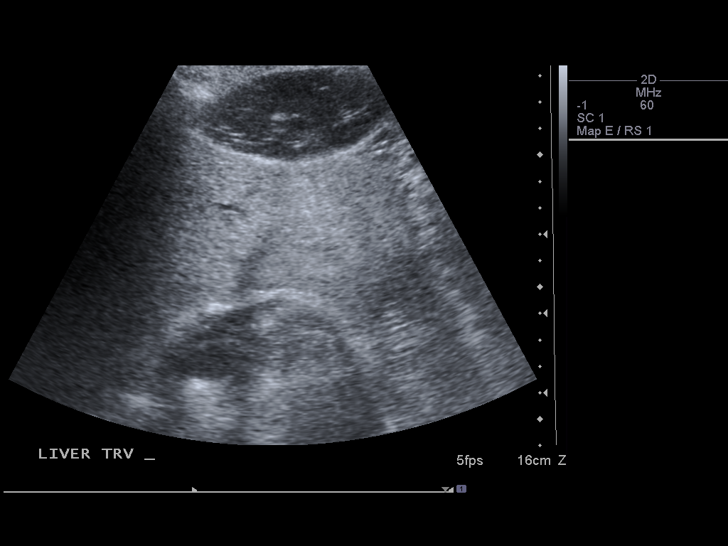
[im 37/81]
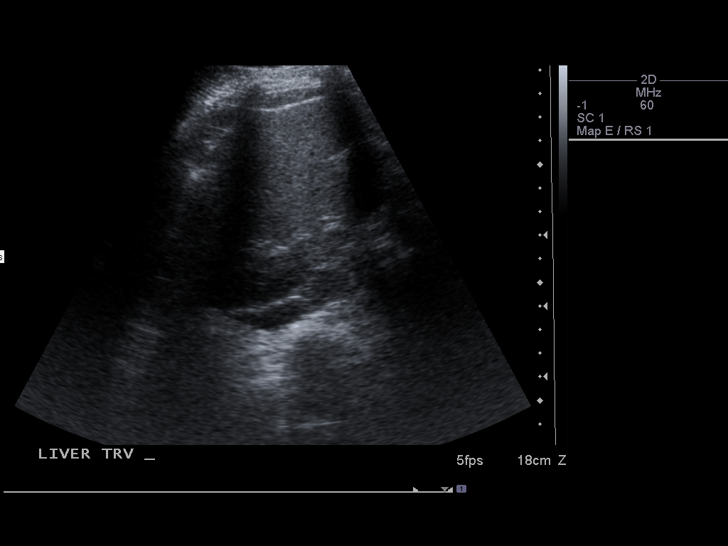
[im 44/81]
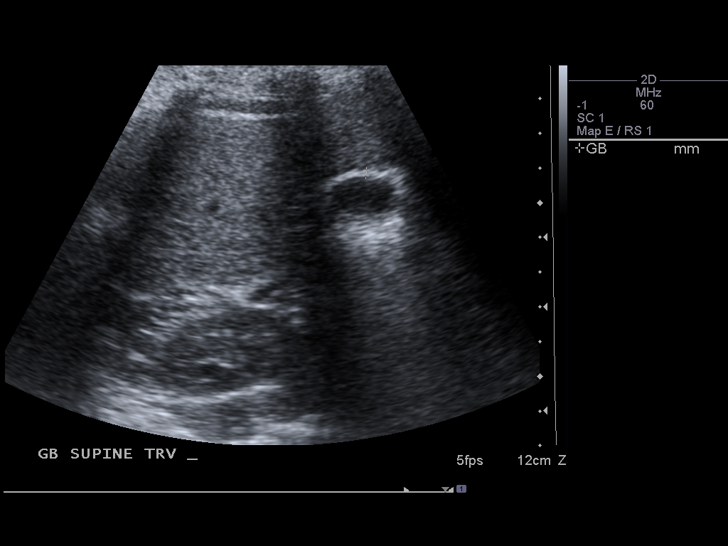
[im 51/81]
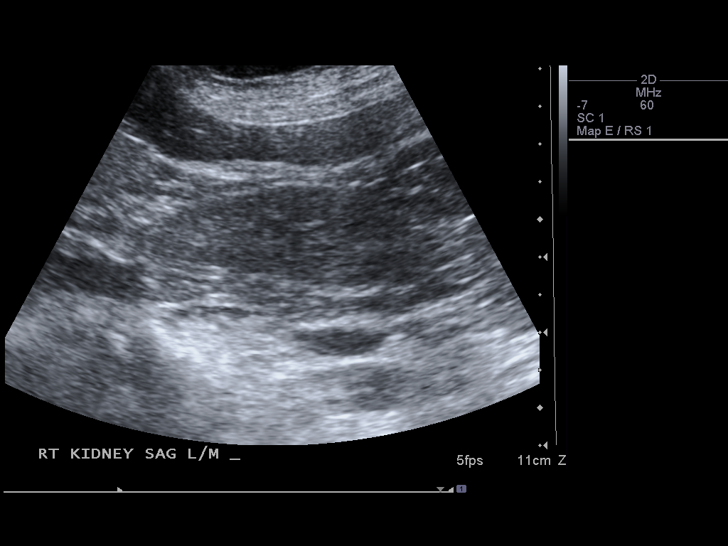
[im 54/81]
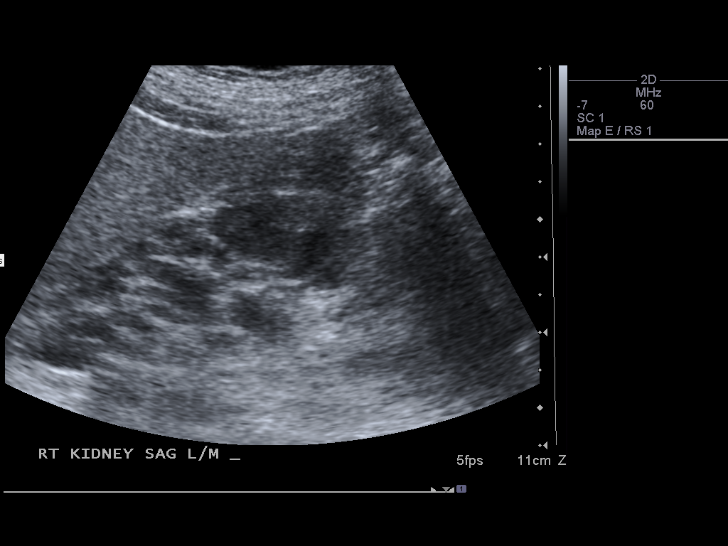
[im 61/81]
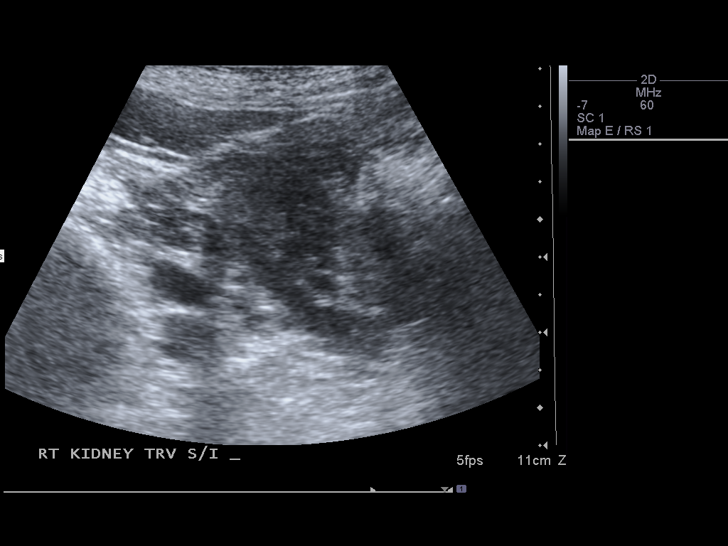
[im 67/81]
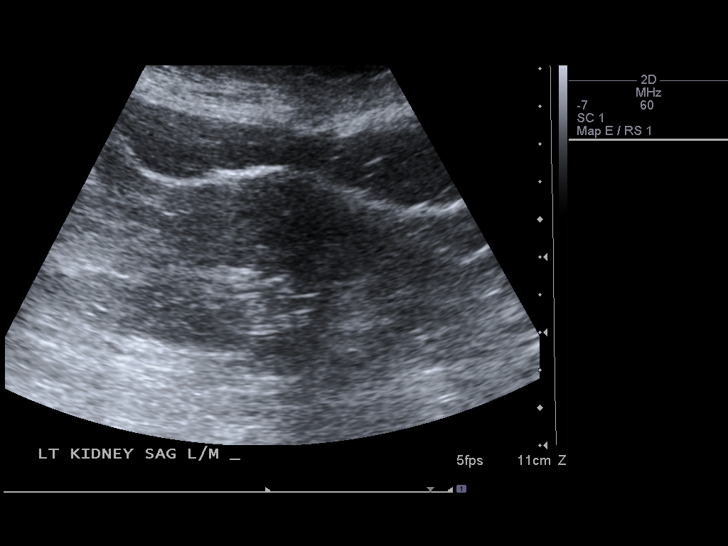
[im 74/81]
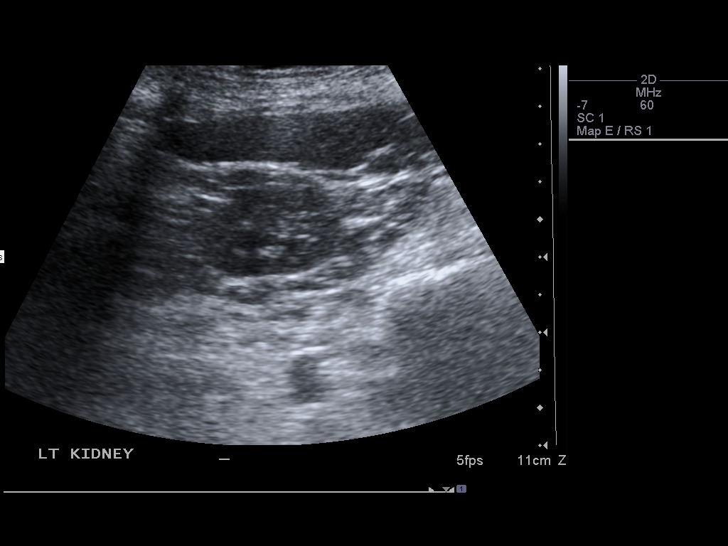
[im 81/81]
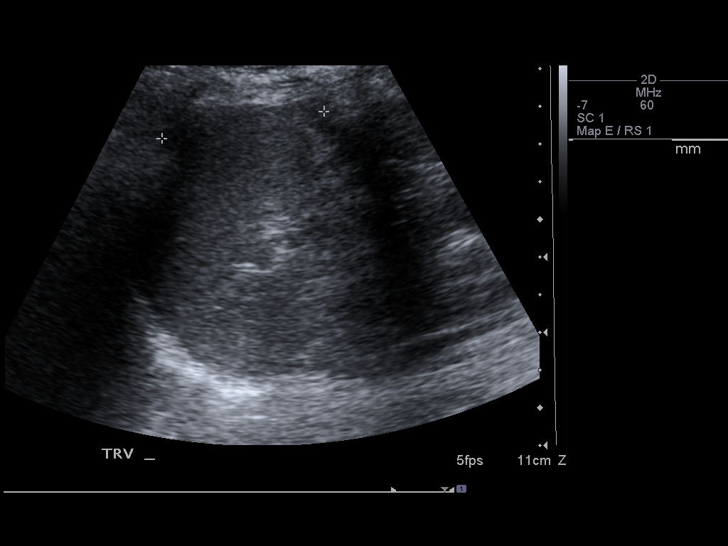

[14 of 25 positions shown; findings below may reference images not displayed]

FINDINGS: Gallbladder:  No gallstones, gallbladder wall thickening, or
pericholecystic fluid.  

Common Bile Duct:  Within normal limits in caliber.  The CBD
measures 3 mm in greatest diameter.

Liver:  Mild increased hepatic echogenicity may represent mild
fatty infiltration.  No focal hepatic abnormalities are identified.

IVC:  Appears normal.

Pancreas:  Although the pancreas is difficult to visualize in its
entirety, no focal pancreatic abnormality is identified.

Spleen:  Within normal limits in size and echotexture.

Right kidney: Normal in size and parenchymal echogenicity.  No
evidence of mass or hydronephrosis.

Left kidney: Normal in size and parenchymal echogenicity.  No
evidence of mass or hydronephrosis.

Abdominal Aorta:  No aneurysm identified.

No evidence of ascites noted.
IMPRESSION: Possible mild fatty infiltration of the liver, otherwise
unremarkable abdominal ultrasound.

## 2010-04-09 ENCOUNTER — Ambulatory Visit: Payer: Self-pay | Admitting: Oncology

## 2010-04-10 LAB — CBC WITH DIFFERENTIAL/PLATELET
BASO%: 0.2 % (ref 0.0–2.0)
HCT: 30.7 % — ABNORMAL LOW (ref 34.8–46.6)
LYMPH%: 24.1 % (ref 14.0–49.7)
MCH: 35.9 pg — ABNORMAL HIGH (ref 25.1–34.0)
MCHC: 34.6 g/dL (ref 31.5–36.0)
MCV: 103.8 fL — ABNORMAL HIGH (ref 79.5–101.0)
MONO#: 0.7 10*3/uL (ref 0.1–0.9)
MONO%: 18 % — ABNORMAL HIGH (ref 0.0–14.0)
NEUT%: 55.1 % (ref 38.4–76.8)
Platelets: 271 10*3/uL (ref 145–400)
RBC: 2.96 10*6/uL — ABNORMAL LOW (ref 3.70–5.45)

## 2010-04-10 LAB — COMPREHENSIVE METABOLIC PANEL
ALT: 14 U/L (ref 0–35)
Alkaline Phosphatase: 61 U/L (ref 39–117)
CO2: 23 mEq/L (ref 19–32)
Creatinine, Ser: 0.64 mg/dL (ref 0.40–1.20)
Total Bilirubin: 0.5 mg/dL (ref 0.3–1.2)

## 2010-04-17 LAB — CBC WITH DIFFERENTIAL/PLATELET
Eosinophils Absolute: 0.1 10*3/uL (ref 0.0–0.5)
LYMPH%: 30.6 % (ref 14.0–49.7)
MCHC: 32.4 g/dL (ref 31.5–36.0)
MCV: 104.3 fL — ABNORMAL HIGH (ref 79.5–101.0)
MONO%: 19.7 % — ABNORMAL HIGH (ref 0.0–14.0)
NEUT#: 1.6 10*3/uL (ref 1.5–6.5)
NEUT%: 44.5 % (ref 38.4–76.8)
Platelets: 226 10*3/uL (ref 145–400)
RBC: 3.02 10*6/uL — ABNORMAL LOW (ref 3.70–5.45)
nRBC: 0 % (ref 0–0)

## 2010-04-17 LAB — LACTATE DEHYDROGENASE: LDH: 329 U/L — ABNORMAL HIGH (ref 94–250)

## 2010-04-17 LAB — COMPREHENSIVE METABOLIC PANEL
ALT: 15 U/L (ref 0–35)
BUN: 13 mg/dL (ref 6–23)
CO2: 19 mEq/L (ref 19–32)
Creatinine, Ser: 0.51 mg/dL (ref 0.40–1.20)
Total Bilirubin: 0.5 mg/dL (ref 0.3–1.2)

## 2010-04-24 LAB — CBC WITH DIFFERENTIAL/PLATELET
BASO%: 0.4 % (ref 0.0–2.0)
EOS%: 2.5 % (ref 0.0–7.0)
LYMPH%: 26 % (ref 14.0–49.7)
MCH: 35.8 pg — ABNORMAL HIGH (ref 25.1–34.0)
MCHC: 34 g/dL (ref 31.5–36.0)
MCV: 105.2 fL — ABNORMAL HIGH (ref 79.5–101.0)
MONO%: 11.2 % (ref 0.0–14.0)
NEUT#: 2.1 10*3/uL (ref 1.5–6.5)
Platelets: 262 10*3/uL (ref 145–400)
RBC: 3.09 10*6/uL — ABNORMAL LOW (ref 3.70–5.45)
RDW: 14.9 % — ABNORMAL HIGH (ref 11.2–14.5)

## 2010-04-24 LAB — COMPREHENSIVE METABOLIC PANEL
ALT: 15 U/L (ref 0–35)
AST: 18 U/L (ref 0–37)
Albumin: 4.3 g/dL (ref 3.5–5.2)
Alkaline Phosphatase: 67 U/L (ref 39–117)
Glucose, Bld: 126 mg/dL — ABNORMAL HIGH (ref 70–99)
Potassium: 3.9 mEq/L (ref 3.5–5.3)
Sodium: 137 mEq/L (ref 135–145)
Total Bilirubin: 0.5 mg/dL (ref 0.3–1.2)
Total Protein: 6.9 g/dL (ref 6.0–8.3)

## 2010-05-01 LAB — CBC WITH DIFFERENTIAL/PLATELET
BASO%: 0 % (ref 0.0–2.0)
HCT: 33.9 % — ABNORMAL LOW (ref 34.8–46.6)
LYMPH%: 31.3 % (ref 14.0–49.7)
MCHC: 32.7 g/dL (ref 31.5–36.0)
MCV: 105.3 fL — ABNORMAL HIGH (ref 79.5–101.0)
MONO#: 0.5 10*3/uL (ref 0.1–0.9)
MONO%: 17.4 % — ABNORMAL HIGH (ref 0.0–14.0)
NEUT%: 48.7 % (ref 38.4–76.8)
Platelets: 249 10*3/uL (ref 145–400)
WBC: 3.1 10*3/uL — ABNORMAL LOW (ref 3.9–10.3)

## 2010-05-01 LAB — COMPREHENSIVE METABOLIC PANEL
BUN: 11 mg/dL (ref 6–23)
CO2: 19 mEq/L (ref 19–32)
Calcium: 8.8 mg/dL (ref 8.4–10.5)
Chloride: 107 mEq/L (ref 96–112)
Creatinine, Ser: 0.51 mg/dL (ref 0.40–1.20)
Total Bilirubin: 0.5 mg/dL (ref 0.3–1.2)

## 2010-05-01 LAB — LACTATE DEHYDROGENASE: LDH: 261 U/L — ABNORMAL HIGH (ref 94–250)

## 2010-05-13 ENCOUNTER — Ambulatory Visit: Payer: Self-pay | Admitting: Oncology

## 2010-05-15 LAB — UA PROTEIN, DIPSTICK - CHCC: Protein, Urine: NEGATIVE mg/dL

## 2010-05-15 LAB — CBC WITH DIFFERENTIAL/PLATELET
Basophils Absolute: 0 10*3/uL (ref 0.0–0.1)
Eosinophils Absolute: 0.1 10*3/uL (ref 0.0–0.5)
HGB: 11 g/dL — ABNORMAL LOW (ref 11.6–15.9)
MONO#: 0.8 10*3/uL (ref 0.1–0.9)
NEUT#: 1.5 10*3/uL (ref 1.5–6.5)
RDW: 14.4 % (ref 11.2–14.5)
lymph#: 1.4 10*3/uL (ref 0.9–3.3)

## 2010-05-29 LAB — CBC WITH DIFFERENTIAL/PLATELET
BASO%: 0.9 % (ref 0.0–2.0)
Basophils Absolute: 0 10*3/uL (ref 0.0–0.1)
EOS%: 2.4 % (ref 0.0–7.0)
HGB: 10.9 g/dL — ABNORMAL LOW (ref 11.6–15.9)
MCH: 33.9 pg (ref 25.1–34.0)
RDW: 15.5 % — ABNORMAL HIGH (ref 11.2–14.5)
WBC: 3.4 10*3/uL — ABNORMAL LOW (ref 3.9–10.3)
lymph#: 1.2 10*3/uL (ref 0.9–3.3)

## 2010-05-29 LAB — COMPREHENSIVE METABOLIC PANEL
ALT: 24 U/L (ref 0–35)
Alkaline Phosphatase: 58 U/L (ref 39–117)
Sodium: 138 mEq/L (ref 135–145)
Total Bilirubin: 0.8 mg/dL (ref 0.3–1.2)
Total Protein: 6.9 g/dL (ref 6.0–8.3)

## 2010-05-29 LAB — UA PROTEIN, DIPSTICK - CHCC: Protein, Urine: NEGATIVE mg/dL

## 2010-06-12 ENCOUNTER — Ambulatory Visit: Payer: Self-pay | Admitting: Oncology

## 2010-06-12 LAB — CBC WITH DIFFERENTIAL/PLATELET
BASO%: 2.8 % — ABNORMAL HIGH (ref 0.0–2.0)
EOS%: 3.4 % (ref 0.0–7.0)
HCT: 32.6 % — ABNORMAL LOW (ref 34.8–46.6)
LYMPH%: 39.3 % (ref 14.0–49.7)
MCH: 33.7 pg (ref 25.1–34.0)
MCHC: 31.9 g/dL (ref 31.5–36.0)
NEUT%: 37.1 % — ABNORMAL LOW (ref 38.4–76.8)
Platelets: 202 10*3/uL (ref 145–400)

## 2010-06-26 LAB — CBC WITH DIFFERENTIAL/PLATELET
Eosinophils Absolute: 0.1 10*3/uL (ref 0.0–0.5)
LYMPH%: 28.7 % (ref 14.0–49.7)
MONO#: 0.7 10*3/uL (ref 0.1–0.9)
NEUT#: 2.5 10*3/uL (ref 1.5–6.5)
Platelets: 269 10*3/uL (ref 145–400)
RBC: 3.22 10*6/uL — ABNORMAL LOW (ref 3.70–5.45)
RDW: 14.9 % — ABNORMAL HIGH (ref 11.2–14.5)
WBC: 4.6 10*3/uL (ref 3.9–10.3)
nRBC: 0 % (ref 0–0)

## 2010-06-26 LAB — COMPREHENSIVE METABOLIC PANEL
ALT: 15 U/L (ref 0–35)
AST: 23 U/L (ref 0–37)
Albumin: 4.1 g/dL (ref 3.5–5.2)
Calcium: 9.2 mg/dL (ref 8.4–10.5)
Chloride: 106 mEq/L (ref 96–112)
Potassium: 4.2 mEq/L (ref 3.5–5.3)
Sodium: 137 mEq/L (ref 135–145)

## 2010-06-26 LAB — UA PROTEIN, DIPSTICK - CHCC: Protein, Urine: NEGATIVE mg/dL

## 2010-07-10 LAB — CBC WITH DIFFERENTIAL/PLATELET
BASO%: 0.3 % (ref 0.0–2.0)
EOS%: 3.6 % (ref 0.0–7.0)
MCH: 34 pg (ref 25.1–34.0)
MCHC: 32.3 g/dL (ref 31.5–36.0)
RBC: 2.94 10*6/uL — ABNORMAL LOW (ref 3.70–5.45)
RDW: 14.9 % — ABNORMAL HIGH (ref 11.2–14.5)
WBC: 3.3 10*3/uL — ABNORMAL LOW (ref 3.9–10.3)
lymph#: 1.5 10*3/uL (ref 0.9–3.3)
nRBC: 0 % (ref 0–0)

## 2010-07-10 LAB — UA PROTEIN, DIPSTICK - CHCC: Protein, Urine: NEGATIVE mg/dL

## 2010-07-15 ENCOUNTER — Ambulatory Visit: Payer: Self-pay | Admitting: Oncology

## 2010-07-15 LAB — CBC WITH DIFFERENTIAL/PLATELET
BASO%: 0.4 % (ref 0.0–2.0)
LYMPH%: 31.9 % (ref 14.0–49.7)
MCHC: 32.3 g/dL (ref 31.5–36.0)
MONO#: 0.6 10*3/uL (ref 0.1–0.9)
Platelets: 230 10*3/uL (ref 145–400)
RBC: 3 10*6/uL — ABNORMAL LOW (ref 3.70–5.45)
RDW: 15.2 % — ABNORMAL HIGH (ref 11.2–14.5)
WBC: 4.9 10*3/uL (ref 3.9–10.3)
lymph#: 1.6 10*3/uL (ref 0.9–3.3)
nRBC: 0 % (ref 0–0)

## 2010-07-24 LAB — UA PROTEIN, DIPSTICK - CHCC: Protein, Urine: NEGATIVE mg/dL

## 2010-07-24 LAB — CBC WITH DIFFERENTIAL/PLATELET
BASO%: 1.6 % (ref 0.0–2.0)
Basophils Absolute: 0.1 10*3/uL (ref 0.0–0.1)
EOS%: 2 % (ref 0.0–7.0)
HGB: 11.1 g/dL — ABNORMAL LOW (ref 11.6–15.9)
MCH: 34.9 pg — ABNORMAL HIGH (ref 25.1–34.0)
MONO#: 0.8 10*3/uL (ref 0.1–0.9)
RDW: 15.5 % — ABNORMAL HIGH (ref 11.2–14.5)
WBC: 5 10*3/uL (ref 3.9–10.3)
lymph#: 1.7 10*3/uL (ref 0.9–3.3)

## 2010-07-24 LAB — COMPREHENSIVE METABOLIC PANEL
ALT: 20 U/L (ref 0–35)
BUN: 12 mg/dL (ref 6–23)
CO2: 25 mEq/L (ref 19–32)
Calcium: 9.7 mg/dL (ref 8.4–10.5)
Chloride: 105 mEq/L (ref 96–112)
Creatinine, Ser: 0.57 mg/dL (ref 0.40–1.20)
Glucose, Bld: 107 mg/dL — ABNORMAL HIGH (ref 70–99)
Total Bilirubin: 0.8 mg/dL (ref 0.3–1.2)

## 2010-07-24 LAB — LACTATE DEHYDROGENASE: LDH: 256 U/L — ABNORMAL HIGH (ref 94–250)

## 2010-08-07 LAB — CBC WITH DIFFERENTIAL/PLATELET
BASO%: 0.6 % (ref 0.0–2.0)
EOS%: 1.7 % (ref 0.0–7.0)
HCT: 31.6 % — ABNORMAL LOW (ref 34.8–46.6)
LYMPH%: 38.7 % (ref 14.0–49.7)
MCH: 34.9 pg — ABNORMAL HIGH (ref 25.1–34.0)
MCHC: 32.9 g/dL (ref 31.5–36.0)
NEUT%: 40.7 % (ref 38.4–76.8)
Platelets: 212 10*3/uL (ref 145–400)
RBC: 2.98 10*6/uL — ABNORMAL LOW (ref 3.70–5.45)

## 2010-08-07 LAB — COMPREHENSIVE METABOLIC PANEL
ALT: 19 U/L (ref 0–35)
AST: 23 U/L (ref 0–37)
Alkaline Phosphatase: 50 U/L (ref 39–117)
Creatinine, Ser: 0.49 mg/dL (ref 0.40–1.20)
Sodium: 137 mEq/L (ref 135–145)
Total Bilirubin: 0.9 mg/dL (ref 0.3–1.2)

## 2010-08-11 LAB — CBC WITH DIFFERENTIAL/PLATELET
Basophils Absolute: 0 10*3/uL (ref 0.0–0.1)
Eosinophils Absolute: 0.1 10*3/uL (ref 0.0–0.5)
HCT: 33.7 % — ABNORMAL LOW (ref 34.8–46.6)
HGB: 11.3 g/dL — ABNORMAL LOW (ref 11.6–15.9)
LYMPH%: 38.1 % (ref 14.0–49.7)
MCV: 106.2 fL — ABNORMAL HIGH (ref 79.5–101.0)
MONO%: 17.1 % — ABNORMAL HIGH (ref 0.0–14.0)
NEUT#: 1.5 10*3/uL (ref 1.5–6.5)
NEUT%: 41.9 % (ref 38.4–76.8)
Platelets: 204 10*3/uL (ref 145–400)
RDW: 14.9 % — ABNORMAL HIGH (ref 11.2–14.5)

## 2010-08-11 LAB — COMPREHENSIVE METABOLIC PANEL
Albumin: 4.3 g/dL (ref 3.5–5.2)
Alkaline Phosphatase: 54 U/L (ref 39–117)
BUN: 13 mg/dL (ref 6–23)
Glucose, Bld: 106 mg/dL — ABNORMAL HIGH (ref 70–99)
Potassium: 4 mEq/L (ref 3.5–5.3)

## 2010-08-19 ENCOUNTER — Ambulatory Visit: Payer: Self-pay | Admitting: Oncology

## 2010-08-21 LAB — CBC WITH DIFFERENTIAL/PLATELET
Basophils Absolute: 0 10*3/uL (ref 0.0–0.1)
Eosinophils Absolute: 0.1 10*3/uL (ref 0.0–0.5)
HCT: 30.9 % — ABNORMAL LOW (ref 34.8–46.6)
HGB: 10.3 g/dL — ABNORMAL LOW (ref 11.6–15.9)
MONO#: 0.5 10*3/uL (ref 0.1–0.9)
NEUT#: 1.8 10*3/uL (ref 1.5–6.5)
RDW: 14.8 % — ABNORMAL HIGH (ref 11.2–14.5)
lymph#: 1.4 10*3/uL (ref 0.9–3.3)

## 2010-08-21 LAB — COMPREHENSIVE METABOLIC PANEL
BUN: 13 mg/dL (ref 6–23)
CO2: 28 mEq/L (ref 19–32)
Calcium: 9.5 mg/dL (ref 8.4–10.5)
Chloride: 106 mEq/L (ref 96–112)
Creatinine, Ser: 0.6 mg/dL (ref 0.40–1.20)
Total Bilirubin: 1 mg/dL (ref 0.3–1.2)

## 2010-08-21 LAB — LACTATE DEHYDROGENASE: LDH: 233 U/L (ref 94–250)

## 2010-08-21 LAB — UA PROTEIN, DIPSTICK - CHCC: Protein, Urine: NEGATIVE mg/dL

## 2010-09-11 LAB — CBC WITH DIFFERENTIAL/PLATELET
Basophils Absolute: 0 10*3/uL (ref 0.0–0.1)
EOS%: 2.1 % (ref 0.0–7.0)
Eosinophils Absolute: 0.1 10*3/uL (ref 0.0–0.5)
HGB: 11.6 g/dL (ref 11.6–15.9)
MCH: 34.8 pg — ABNORMAL HIGH (ref 25.1–34.0)
MONO#: 0.4 10*3/uL (ref 0.1–0.9)
NEUT#: 2 10*3/uL (ref 1.5–6.5)
RDW: 13.3 % (ref 11.2–14.5)
WBC: 3.9 10*3/uL (ref 3.9–10.3)
lymph#: 1.4 10*3/uL (ref 0.9–3.3)

## 2010-09-11 LAB — UA PROTEIN, DIPSTICK - CHCC: Protein, Urine: <30 mg/dL

## 2010-10-02 ENCOUNTER — Ambulatory Visit: Payer: Self-pay | Admitting: Oncology

## 2010-10-06 LAB — CBC WITH DIFFERENTIAL/PLATELET
BASO%: 0.2 % (ref 0.0–2.0)
Eosinophils Absolute: 0.1 10*3/uL (ref 0.0–0.5)
LYMPH%: 16.4 % (ref 14.0–49.7)
MCHC: 33.1 g/dL (ref 31.5–36.0)
MONO#: 1.4 10*3/uL — ABNORMAL HIGH (ref 0.1–0.9)
NEUT#: 6.4 10*3/uL (ref 1.5–6.5)
Platelets: 310 10*3/uL (ref 145–400)
RBC: 3.64 10*6/uL — ABNORMAL LOW (ref 3.70–5.45)
RDW: 13.2 % (ref 11.2–14.5)
WBC: 9.5 10*3/uL (ref 3.9–10.3)
nRBC: 0 % (ref 0–0)

## 2010-11-03 LAB — CBC WITH DIFFERENTIAL/PLATELET
EOS%: 2.4 % (ref 0.0–7.0)
LYMPH%: 40.7 % (ref 14.0–49.7)
MCH: 31.9 pg (ref 25.1–34.0)
MCV: 94.1 fL (ref 79.5–101.0)
MONO%: 11.2 % (ref 0.0–14.0)
RBC: 3.92 10*6/uL (ref 3.70–5.45)
RDW: 13.5 % (ref 11.2–14.5)
nRBC: 0 % (ref 0–0)

## 2010-11-03 LAB — LACTATE DEHYDROGENASE: LDH: 148 U/L (ref 94–250)

## 2010-11-03 LAB — UA PROTEIN, DIPSTICK - CHCC: Protein, Urine: NEGATIVE mg/dL

## 2010-11-03 LAB — COMPREHENSIVE METABOLIC PANEL
Albumin: 3.7 g/dL (ref 3.5–5.2)
Alkaline Phosphatase: 60 U/L (ref 39–117)
BUN: 12 mg/dL (ref 6–23)
CO2: 27 mEq/L (ref 19–32)
Calcium: 9.9 mg/dL (ref 8.4–10.5)
Chloride: 105 mEq/L (ref 96–112)
Glucose, Bld: 94 mg/dL (ref 70–99)
Potassium: 4 mEq/L (ref 3.5–5.3)
Sodium: 139 mEq/L (ref 135–145)
Total Protein: 7.3 g/dL (ref 6.0–8.3)

## 2010-11-26 ENCOUNTER — Ambulatory Visit (HOSPITAL_BASED_OUTPATIENT_CLINIC_OR_DEPARTMENT_OTHER): Payer: Managed Care, Other (non HMO) | Admitting: Oncology

## 2010-12-01 LAB — CBC WITH DIFFERENTIAL/PLATELET
BASO%: 0.2 % (ref 0.0–2.0)
Basophils Absolute: 0 10*3/uL (ref 0.0–0.1)
EOS%: 2.3 % (ref 0.0–7.0)
Eosinophils Absolute: 0.1 10*3/uL (ref 0.0–0.5)
HCT: 39.3 % (ref 34.8–46.6)
HGB: 13.1 g/dL (ref 11.6–15.9)
LYMPH%: 39 % (ref 14.0–49.7)
MCH: 31 pg (ref 25.1–34.0)
MCHC: 33.3 g/dL (ref 31.5–36.0)
MCV: 93.1 fL (ref 79.5–101.0)
MONO#: 0.5 10*3/uL (ref 0.1–0.9)
MONO%: 10.9 % (ref 0.0–14.0)
NEUT#: 2.1 10*3/uL (ref 1.5–6.5)
NEUT%: 47.6 % (ref 38.4–76.8)
Platelets: 247 10*3/uL (ref 145–400)
RBC: 4.22 10*6/uL (ref 3.70–5.45)
RDW: 14.1 % (ref 11.2–14.5)
WBC: 4.4 10*3/uL (ref 3.9–10.3)
lymph#: 1.7 10*3/uL (ref 0.9–3.3)
nRBC: 0 % (ref 0–0)

## 2010-12-01 LAB — COMPREHENSIVE METABOLIC PANEL
ALT: 19 U/L (ref 0–35)
AST: 20 U/L (ref 0–37)
Albumin: 4.4 g/dL (ref 3.5–5.2)
Alkaline Phosphatase: 67 U/L (ref 39–117)
BUN: 14 mg/dL (ref 6–23)
CO2: 18 mEq/L — ABNORMAL LOW (ref 19–32)
Calcium: 9.4 mg/dL (ref 8.4–10.5)
Chloride: 104 mEq/L (ref 96–112)
Creatinine, Ser: 0.67 mg/dL (ref 0.40–1.20)
Glucose, Bld: 120 mg/dL — ABNORMAL HIGH (ref 70–99)
Potassium: 4.9 mEq/L (ref 3.5–5.3)
Sodium: 136 mEq/L (ref 135–145)
Total Bilirubin: 0.3 mg/dL (ref 0.3–1.2)
Total Protein: 7.4 g/dL (ref 6.0–8.3)

## 2010-12-01 LAB — PROTIME-INR
INR: 1.3 — ABNORMAL LOW (ref 2.00–3.50)
Protime: 15.6 Seconds — ABNORMAL HIGH (ref 10.6–13.4)

## 2010-12-01 LAB — UA PROTEIN, DIPSTICK - CHCC: Protein, Urine: NEGATIVE mg/dL

## 2010-12-04 LAB — PROTIME-INR
INR: 1.8 — ABNORMAL LOW (ref 2.00–3.50)
Protime: 21.6 Seconds — ABNORMAL HIGH (ref 10.6–13.4)

## 2010-12-15 LAB — PROTIME-INR
INR: 1.9 — ABNORMAL LOW (ref 2.00–3.50)
Protime: 22.8 Seconds — ABNORMAL HIGH (ref 10.6–13.4)

## 2010-12-22 ENCOUNTER — Encounter (HOSPITAL_BASED_OUTPATIENT_CLINIC_OR_DEPARTMENT_OTHER): Payer: Managed Care, Other (non HMO) | Admitting: Oncology

## 2010-12-22 DIAGNOSIS — I2699 Other pulmonary embolism without acute cor pulmonale: Secondary | ICD-10-CM

## 2010-12-22 DIAGNOSIS — Z7901 Long term (current) use of anticoagulants: Secondary | ICD-10-CM

## 2010-12-22 DIAGNOSIS — Z5112 Encounter for antineoplastic immunotherapy: Secondary | ICD-10-CM

## 2010-12-22 DIAGNOSIS — C711 Malignant neoplasm of frontal lobe: Secondary | ICD-10-CM

## 2010-12-22 LAB — PROTIME-INR: INR: 1.7 — ABNORMAL LOW (ref 2.00–3.50)

## 2010-12-29 ENCOUNTER — Other Ambulatory Visit: Payer: Self-pay | Admitting: Oncology

## 2010-12-29 ENCOUNTER — Encounter (HOSPITAL_BASED_OUTPATIENT_CLINIC_OR_DEPARTMENT_OTHER): Payer: Managed Care, Other (non HMO) | Admitting: Oncology

## 2010-12-29 DIAGNOSIS — I2699 Other pulmonary embolism without acute cor pulmonale: Secondary | ICD-10-CM

## 2010-12-29 DIAGNOSIS — C719 Malignant neoplasm of brain, unspecified: Secondary | ICD-10-CM

## 2010-12-29 DIAGNOSIS — Z5112 Encounter for antineoplastic immunotherapy: Secondary | ICD-10-CM

## 2010-12-29 DIAGNOSIS — C711 Malignant neoplasm of frontal lobe: Secondary | ICD-10-CM

## 2010-12-29 DIAGNOSIS — C710 Malignant neoplasm of cerebrum, except lobes and ventricles: Secondary | ICD-10-CM

## 2010-12-29 DIAGNOSIS — Z7901 Long term (current) use of anticoagulants: Secondary | ICD-10-CM

## 2010-12-29 LAB — CBC WITH DIFFERENTIAL/PLATELET
BASO%: 0.4 % (ref 0.0–2.0)
EOS%: 1.3 % (ref 0.0–7.0)
HGB: 13 g/dL (ref 11.6–15.9)
LYMPH%: 33.4 % (ref 14.0–49.7)
MCH: 31.5 pg (ref 25.1–34.0)
MCHC: 34.4 g/dL (ref 31.5–36.0)
MCV: 91.4 fL (ref 79.5–101.0)
MONO#: 0.7 10*3/uL (ref 0.1–0.9)
NEUT#: 2.7 10*3/uL (ref 1.5–6.5)
NEUT%: 51.2 % (ref 38.4–76.8)
RBC: 4.12 10*6/uL (ref 3.70–5.45)

## 2010-12-29 LAB — COMPREHENSIVE METABOLIC PANEL
ALT: 17 U/L (ref 0–35)
AST: 22 U/L (ref 0–37)
Alkaline Phosphatase: 77 U/L (ref 39–117)
Creatinine, Ser: 0.75 mg/dL (ref 0.40–1.20)
Sodium: 138 mEq/L (ref 135–145)
Total Bilirubin: 0.4 mg/dL (ref 0.3–1.2)
Total Protein: 7.5 g/dL (ref 6.0–8.3)

## 2010-12-29 LAB — PROTIME-INR: INR: 2.5 (ref 2.00–3.50)

## 2011-01-26 ENCOUNTER — Other Ambulatory Visit: Payer: Self-pay | Admitting: Oncology

## 2011-01-26 ENCOUNTER — Encounter (HOSPITAL_BASED_OUTPATIENT_CLINIC_OR_DEPARTMENT_OTHER): Payer: Managed Care, Other (non HMO) | Admitting: Oncology

## 2011-01-26 DIAGNOSIS — Z7901 Long term (current) use of anticoagulants: Secondary | ICD-10-CM

## 2011-01-26 DIAGNOSIS — C710 Malignant neoplasm of cerebrum, except lobes and ventricles: Secondary | ICD-10-CM

## 2011-01-26 DIAGNOSIS — I2699 Other pulmonary embolism without acute cor pulmonale: Secondary | ICD-10-CM

## 2011-01-26 DIAGNOSIS — C711 Malignant neoplasm of frontal lobe: Secondary | ICD-10-CM

## 2011-01-26 DIAGNOSIS — C7931 Secondary malignant neoplasm of brain: Secondary | ICD-10-CM

## 2011-01-26 LAB — COMPREHENSIVE METABOLIC PANEL
ALT: 17 U/L (ref 0–35)
AST: 20 U/L (ref 0–37)
Alkaline Phosphatase: 72 U/L (ref 39–117)
Creatinine, Ser: 0.57 mg/dL (ref 0.40–1.20)
Sodium: 136 mEq/L (ref 135–145)
Total Bilirubin: 0.3 mg/dL (ref 0.3–1.2)

## 2011-01-26 LAB — CBC WITH DIFFERENTIAL/PLATELET
BASO%: 0.3 % (ref 0.0–2.0)
EOS%: 2.1 % (ref 0.0–7.0)
HGB: 12.8 g/dL (ref 11.6–15.9)
MCH: 30.9 pg (ref 25.1–34.0)
MCHC: 33.9 g/dL (ref 31.5–36.0)
MONO#: 0.5 10*3/uL (ref 0.1–0.9)
RDW: 15.2 % — ABNORMAL HIGH (ref 11.2–14.5)
WBC: 4.7 10*3/uL (ref 3.9–10.3)
lymph#: 1.3 10*3/uL (ref 0.9–3.3)

## 2011-01-26 LAB — PROTIME-INR: INR: 2.2 (ref 2.00–3.50)

## 2011-02-22 LAB — DIFFERENTIAL
Basophils Absolute: 0 10*3/uL (ref 0.0–0.1)
Eosinophils Absolute: 0 10*3/uL (ref 0.0–0.7)
Eosinophils Relative: 0 % (ref 0–5)
Eosinophils Relative: 0 % (ref 0–5)
Lymphocytes Relative: 21 % (ref 12–46)
Lymphs Abs: 1.4 10*3/uL (ref 0.7–4.0)
Lymphs Abs: 1.5 10*3/uL (ref 0.7–4.0)
Monocytes Absolute: 0.5 10*3/uL (ref 0.1–1.0)
Monocytes Relative: 5 % (ref 3–12)
Neutrophils Relative %: 77 % (ref 43–77)

## 2011-02-22 LAB — URINALYSIS, ROUTINE W REFLEX MICROSCOPIC
Bilirubin Urine: NEGATIVE
Ketones, ur: NEGATIVE mg/dL
Nitrite: NEGATIVE
Protein, ur: NEGATIVE mg/dL
Specific Gravity, Urine: 1.011 (ref 1.005–1.030)
Urobilinogen, UA: 0.2 mg/dL (ref 0.0–1.0)

## 2011-02-22 LAB — COMPREHENSIVE METABOLIC PANEL
ALT: 61 U/L — ABNORMAL HIGH (ref 0–35)
ALT: 72 U/L — ABNORMAL HIGH (ref 0–35)
AST: 26 U/L (ref 0–37)
AST: 34 U/L (ref 0–37)
Albumin: 2.4 g/dL — ABNORMAL LOW (ref 3.5–5.2)
Albumin: 2.7 g/dL — ABNORMAL LOW (ref 3.5–5.2)
Alkaline Phosphatase: 35 U/L — ABNORMAL LOW (ref 39–117)
Alkaline Phosphatase: 39 U/L (ref 39–117)
BUN: 10 mg/dL (ref 6–23)
BUN: 13 mg/dL (ref 6–23)
CO2: 23 mEq/L (ref 19–32)
CO2: 25 mEq/L (ref 19–32)
Calcium: 8.9 mg/dL (ref 8.4–10.5)
Calcium: 9 mg/dL (ref 8.4–10.5)
Chloride: 101 mEq/L (ref 96–112)
Chloride: 107 mEq/L (ref 96–112)
Chloride: 97 mEq/L (ref 96–112)
Creatinine, Ser: 0.64 mg/dL (ref 0.4–1.2)
Creatinine, Ser: 0.85 mg/dL (ref 0.4–1.2)
GFR calc Af Amer: >60 mL/min (ref >60)
GFR calc Af Amer: >60 mL/min (ref >60)
GFR calc non Af Amer: >60 mL/min (ref >60)
GFR calc non Af Amer: >60 mL/min (ref >60)
Glucose, Bld: 125 mg/dL — ABNORMAL HIGH (ref 70–99)
Glucose, Bld: 172 mg/dL — ABNORMAL HIGH (ref 70–99)
Potassium: 4.3 mEq/L (ref 3.5–5.1)
Potassium: 4.4 mEq/L (ref 3.5–5.1)
Sodium: 134 mEq/L — ABNORMAL LOW (ref 135–145)
Sodium: 139 mEq/L (ref 135–145)
Total Bilirubin: 0.3 mg/dL (ref 0.3–1.2)
Total Bilirubin: 0.5 mg/dL (ref 0.3–1.2)
Total Bilirubin: 1 mg/dL (ref 0.3–1.2)
Total Protein: 5.5 g/dL — ABNORMAL LOW (ref 6.0–8.3)
Total Protein: 5.6 g/dL — ABNORMAL LOW (ref 6.0–8.3)

## 2011-02-22 LAB — TROPONIN I: Troponin I: 0.03 ng/mL (ref 0.00–0.06)

## 2011-02-22 LAB — GLUCOSE, CAPILLARY
Glucose-Capillary: 100 mg/dL — ABNORMAL HIGH (ref 70–99)
Glucose-Capillary: 101 mg/dL — ABNORMAL HIGH (ref 70–99)
Glucose-Capillary: 110 mg/dL — ABNORMAL HIGH (ref 70–99)
Glucose-Capillary: 131 mg/dL — ABNORMAL HIGH (ref 70–99)
Glucose-Capillary: 132 mg/dL — ABNORMAL HIGH (ref 70–99)
Glucose-Capillary: 137 mg/dL — ABNORMAL HIGH (ref 70–99)
Glucose-Capillary: 139 mg/dL — ABNORMAL HIGH (ref 70–99)
Glucose-Capillary: 157 mg/dL — ABNORMAL HIGH (ref 70–99)
Glucose-Capillary: 163 mg/dL — ABNORMAL HIGH (ref 70–99)
Glucose-Capillary: 68 mg/dL — ABNORMAL LOW (ref 70–99)
Glucose-Capillary: 98 mg/dL (ref 70–99)

## 2011-02-22 LAB — CULTURE, BLOOD (ROUTINE X 2)
Culture: NO GROWTH
Culture: NO GROWTH

## 2011-02-22 LAB — CARDIAC PANEL(CRET KIN+CKTOT+MB+TROPI)
CK, MB: 0.6 ng/mL (ref 0.3–4.0)
Relative Index: INVALID (ref 0.0–2.5)
Total CK: 10 U/L (ref 7–177)
Total CK: <7 U/L — ABNORMAL LOW (ref 7–177)

## 2011-02-22 LAB — URINE CULTURE: Colony Count: >=100000

## 2011-02-22 LAB — CBC
HCT: 30.4 % — ABNORMAL LOW (ref 36.0–46.0)
Hemoglobin: 10.6 g/dL — ABNORMAL LOW (ref 12.0–15.0)
MCHC: 34.5 g/dL (ref 30.0–36.0)
MCHC: 34.5 g/dL (ref 30.0–36.0)
MCV: 87 fL (ref 78.0–100.0)
MCV: 88.7 fL (ref 78.0–100.0)
Platelets: 263 10*3/uL (ref 150–400)
Platelets: 285 10*3/uL (ref 150–400)
RBC: 3.5 MIL/uL — ABNORMAL LOW (ref 3.87–5.11)
RDW: 22.5 % — ABNORMAL HIGH (ref 11.5–15.5)
WBC: 10.3 10*3/uL (ref 4.0–10.5)
WBC: 8 10*3/uL (ref 4.0–10.5)

## 2011-02-22 LAB — FERRITIN: Ferritin: 196 ng/mL (ref 10–291)

## 2011-02-22 LAB — IRON AND TIBC
Saturation Ratios: 21 % (ref 20–55)
UIBC: 238 ug/dL

## 2011-02-22 LAB — CK: Total CK: 20 U/L (ref 7–177)

## 2011-02-22 LAB — HEPATITIS PANEL, ACUTE
Hep B C IgM: NEGATIVE
Hepatitis B Surface Ag: NEGATIVE

## 2011-02-23 ENCOUNTER — Other Ambulatory Visit: Payer: Self-pay | Admitting: Oncology

## 2011-02-23 ENCOUNTER — Encounter (HOSPITAL_BASED_OUTPATIENT_CLINIC_OR_DEPARTMENT_OTHER): Payer: Managed Care, Other (non HMO) | Admitting: Oncology

## 2011-02-23 DIAGNOSIS — C7931 Secondary malignant neoplasm of brain: Secondary | ICD-10-CM

## 2011-02-23 DIAGNOSIS — I2699 Other pulmonary embolism without acute cor pulmonale: Secondary | ICD-10-CM

## 2011-02-23 DIAGNOSIS — C710 Malignant neoplasm of cerebrum, except lobes and ventricles: Secondary | ICD-10-CM

## 2011-02-23 DIAGNOSIS — Z5112 Encounter for antineoplastic immunotherapy: Secondary | ICD-10-CM

## 2011-02-23 LAB — PROTIME-INR: INR: 2.3 (ref 2.00–3.50)

## 2011-02-25 LAB — BLOOD GAS, ARTERIAL
Acid-Base Excess: 2.2 mmol/L — ABNORMAL HIGH (ref 0.0–2.0)
Bicarbonate: 25.9 mEq/L — ABNORMAL HIGH (ref 20.0–24.0)
FIO2: 0.3 %
MECHVT: 450 mL
Patient temperature: 98.6
TCO2: 26.8 mmol/L (ref 0–100)
TCO2: 26.8 mmol/L (ref 0–100)
pCO2 arterial: 29.1 mmHg — ABNORMAL LOW (ref 35.0–45.0)
pH, Arterial: 7.558 — ABNORMAL HIGH (ref 7.350–7.400)
pO2, Arterial: 141 mmHg — ABNORMAL HIGH (ref 80.0–100.0)

## 2011-02-25 LAB — CBC
HCT: 35.2 % — ABNORMAL LOW (ref 36.0–46.0)
Hemoglobin: 12.3 g/dL (ref 12.0–15.0)
Platelets: 362 10*3/uL (ref 150–400)
RBC: 4.11 MIL/uL (ref 3.87–5.11)
WBC: 13.4 10*3/uL — ABNORMAL HIGH (ref 4.0–10.5)

## 2011-02-25 LAB — BASIC METABOLIC PANEL
BUN: 12 mg/dL (ref 6–23)
Calcium: 8.9 mg/dL (ref 8.4–10.5)
GFR calc Af Amer: >60 mL/min (ref >60)
GFR calc non Af Amer: >60 mL/min (ref >60)
GFR calc non Af Amer: >60 mL/min (ref >60)
Glucose, Bld: 142 mg/dL — ABNORMAL HIGH (ref 70–99)
Potassium: 4.1 mEq/L (ref 3.5–5.1)
Sodium: 132 mEq/L — ABNORMAL LOW (ref 135–145)
Sodium: 135 mEq/L (ref 135–145)

## 2011-02-25 LAB — POCT I-STAT 7, (LYTES, BLD GAS, ICA,H+H)
Hemoglobin: 10.2 g/dL — ABNORMAL LOW (ref 12.0–15.0)
Patient temperature: 36
Potassium: 4 mEq/L (ref 3.5–5.1)
TCO2: 28 mmol/L (ref 0–100)
pCO2 arterial: 45.1 mmHg — ABNORMAL HIGH (ref 35.0–45.0)
pH, Arterial: 7.372 (ref 7.350–7.400)

## 2011-02-25 LAB — DIFFERENTIAL
Eosinophils Relative: 0 % (ref 0–5)
Lymphocytes Relative: 5 % — ABNORMAL LOW (ref 12–46)
Lymphs Abs: 0.6 10*3/uL — ABNORMAL LOW (ref 0.7–4.0)
Monocytes Absolute: 0.5 10*3/uL (ref 0.1–1.0)
Monocytes Relative: 4 % (ref 3–12)

## 2011-02-25 LAB — PROTIME-INR: Prothrombin Time: 14.1 seconds (ref 11.6–15.2)

## 2011-03-23 ENCOUNTER — Encounter (HOSPITAL_BASED_OUTPATIENT_CLINIC_OR_DEPARTMENT_OTHER): Payer: Managed Care, Other (non HMO) | Admitting: Oncology

## 2011-03-23 ENCOUNTER — Other Ambulatory Visit: Payer: Self-pay | Admitting: Oncology

## 2011-03-23 DIAGNOSIS — Z5112 Encounter for antineoplastic immunotherapy: Secondary | ICD-10-CM

## 2011-03-23 DIAGNOSIS — C7931 Secondary malignant neoplasm of brain: Secondary | ICD-10-CM

## 2011-03-23 DIAGNOSIS — C710 Malignant neoplasm of cerebrum, except lobes and ventricles: Secondary | ICD-10-CM

## 2011-03-23 DIAGNOSIS — C719 Malignant neoplasm of brain, unspecified: Secondary | ICD-10-CM

## 2011-03-23 LAB — PROTIME-INR: Protime: 26.4 Seconds — ABNORMAL HIGH (ref 10.6–13.4)

## 2011-03-23 LAB — UA PROTEIN, DIPSTICK - CHCC: Protein, Urine: NEGATIVE mg/dL

## 2011-03-31 NOTE — Op Note (Signed)
NAMEAIMEE, HELDMAN NO.:  [id number]   MEDICAL RECORD NO.:  [id number]          PATIENT TYPE:  INP   LOCATION:  3025                         FACILITY:  [facility]   PHYSICIAN:  Coletta Memos, M.D.     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   PROCEDURE:  Stereotactic ultrasound-guided drainage of cyst via redo  craniotomy.   COMPLICATIONS:  None.   SURGEON:  Coletta Memos, MD   ASSISTANT:  None.   INDICATIONS:  Aftin Lye is a 50 year old whom I had taken to the  operating room on February 20, 2009, where she underwent a right  frontotemporal craniotomy, anterior temporal lobectomy, and drainage of  cyst.  I obtained a large amount of fluid from the cyst at that first  operation, both postoperatively, her neurologic status did not improve.  Did a postop MRI and CT and what it showed was that she still had a  large number of cyst still present.  As a result of that I spoke to the  family felt the best that we go back to the operating room, specifically  for drainage of the cyst.  Prior to that I had spoke with Pathology, and  they had diagnostic tissue for the tumor, so there was no need to try to  do any more resection.   OPERATIVE NOTE:  Ms. Capobianco was brought to the operating room,  intubated, and placed on a donut.  Her head was turned to the side as  was the body and a roll was placed behind her back.  I removed the old  dressing.  I removed the staples and then prepped her head in a sterile  fashion.  I then draped her out in a sterile fashion.  I opened the old  incision using Metzenbaum scissors.  I reflected the flap  anteriorly.  I  removed the bone flap.  I then opened the dura, reflected that flap  anteriorly.  At that point, I brought in the ultrasound and with the use  of ultrasound and the brain needle, I was able to aspirate 2 of the  largest cysts, which were remaining.  I did this via frontal approach  well above the Sylvian fissure.  I looked at my resection cavity from  the temporal lobe and that remained dry.  I removed again a good amount  of fluid from the cyst that I drained.  I then irrigated.  I then closed  the wound in layered fashion using Nurolon to reapproximate the dura,  Vicryls to reapproximate the temporalis muscle and for subgaleal  sutures, staples to reapproximate the skin.  I applied a sterile  dressing and I left Ms. Litts intubated at this point until the  morning.          ______________________________  Coletta Memos, M.D.    KC/MEDQ  D:  02/27/2009  T:  02/28/2009  Job:  [id number]

## 2011-03-31 NOTE — H&P (Signed)
NAMELARANDA, BURKEMPER NO.:  [id number]   MEDICAL RECORD NO.:  [id number]          PATIENT TYPE:  INP   LOCATION:  0108                         [facility]AC[facility]   PHYSICIAN:  Massie Maroon, MD        DATE OF BIRTH:  Dec 30, 1960   DATE OF ADMISSION:  05/31/2009  DATE OF DISCHARGE:                              HISTORY & PHYSICAL   CHIEF COMPLAINT:  Near syncope, disequilibrium   HISTORY OF PRESENT ILLNESS:  A 50 year old female with a history of  glioblastoma of the right basal ganglia and status post craniotomy x2,  initially on February 20, 2009 for anterior temporal lobectomy and  subsequent stereotactic ultrasound-guided drainage of the cyst via re-do  craniotomy February 22, 2009, status post recent chemo at [facility], who  complains of near syncope starting yesterday.  She complains of  equilibrium loss.  She has had decreased appetite but denies any blurred  vision, which she has had since surgery.  Is stable.  The patient denies  any headache, focal weakness, chest pain, palpitations, shortness of  breath, cough, diarrhea, bright red blood per rectum, dysuria,  hematuria.  The patient was brought to the ED for evaluation and found  to have slightly abnormal liver function tests and anemia as well as  some mild hyperkalemia and urinary tract infection with WBCs too  numerous to count.  The patient will be admitted for workup of near  syncope, probably secondary to UTI.   PAST MEDICAL HISTORY:  1. Glioblastoma, right insular region, cystic mass, status post      craniotomy consisting of the anterior temporal lobectomy and then      subsequently craniotomy for cyst drainage.  2. Hypothyroidism.  3. Recent diagnosis of pulmonary embolus May 17, 2009 as well as      pneumonia.   PAST SURGICAL HISTORY:  1. See above, craniotomies.  2. Tubal ligation, 1985.   SOCIAL HISTORY:  The patient is married with children and has been  previously working as a Conservation officer, nature.  She  previously was smoking up to about  a year ago.  She drank alcohol occasionally.  She was born in [place],  [place].  She denies any drug use.   FAMILY HISTORY:  Positive for stroke.  Her mother died of a stroke at  age 29.  Her mother had cervical cancer.  Her father died at age 81.  He  apparently had a syncopal episode and broke his hip and died  subsequently. Siblings:  Two brothers and two sisters with one sister  suffering from COPD and another from depression.   REVIEW OF SYSTEMS:  Negative for fever, chills.  Negative for all 10  organ systems except for pertinent positives as stated above.   ALLERGIES:  BACTRIM causes hives.   MEDICATIONS:  Lovenox 60 mg subcu b.i.d., Keppra 500 mg p.o. b.i.d.,  Synthroid 100 mcg p.o. daily, Prilosec 40 mg p.o. daily, pentamidine 250  mg IV q.6h., Zofran 8 mg p.r.n., dexamethasone 2 mg p.o. b.i.d., Lantus  15 units subcu q.h.s.   PHYSICAL EXAMINATION:  Temperature 97.6,  pulse 77, respiratory rate 20,  blood pressure 64/37, pulse ox 96% on room air.  HEENT:  Anicteric, puffy face, EOMI, no nystagmus, pupils 1.5 mm,  symmetric, direct, consensual, near reflexes intact.  Mucous membranes  moist.  NECK:  No JVD, no bruit, no thyromegaly, no adenopathy.  HEART:  In the regular rate and rhythm.  S1-S2 no murmurs, gallops or  rubs.  LUGNS:  Clear to auscultation bilaterally.  ABDOMEN:  Soft, obese, nontender, nondistended.  Positive bowel sounds.  EXTREMITIES:  No cyanosis, clubbing or edema.  SKIN:  No rashes.  LYMPH NODES:  No adenopathy.  NEUROLOGIC:  Cranial nerves II-XII intact, reflexes 2+, symmetric,  diffuse with equivocal toe on the right, downgoing toe on the left.  PSYCH:  Alert and oriented x3, no acute distress.   LABS:  WBC 7.0, hemoglobin 10.5, platelet count 285.  Sodium 131,  potassium 5.2 (high), chloride 97, bicarb 24, BUN 13, creatinine 0.85,  glucose 138.  AST 51 (high), ALT 82 (high), alk phos 38, total bilirubin   1.0, CPK 20, troponin-I 0.03.  Urinalysis showed WBCs too numerous to  count, RBCs negative.   Chest x-ray showed right middle lung atelectasis versus infiltrate.   ASSESSMENT:  1. Hypotension:  The patient placed on telemetry.  We will check a      cortisol level stat..  Will check a 2-D cardiac echo.  We will      check troponin-I q.8h. x3 sets.  We will increase dexamethasone to      4 mg IV q.6h.  2. Near syncope:  With her pulse ox being normal and her heart rate      being normal, I doubt that she has had recurrent pulmonary embolus,      especially in light that she is on therapeutic Lovenox.  We will      check a CT scan of the brain, noncontrast, to evaluate her brain      for any pathologic causing near syncope.  3. Pulmonary embolus:  Continue Lovenox.  4. Glioblastoma:  Follow up outpatient with her oncologist and an      radiation oncologist as well as neurosurgeon.  5. Anemia.  We can check an iron, TIBC, ferritin, B12, folic acid and      consider checking LDH, haptoglobin, SPEP, UPEP if is anemia      persists or worsens.  The patient can also have an outpatient      colonoscopy/EGD to further evaluate anemia if this continues.  It      may be that her anemia is due to recent chemotherapy.  6. Hyperkalemia:  The patient was apparently given potassium      yesterday.  We will hydrate with normal saline and recheck      potassium level.  7. Urinary tract infection/pneumonia:  We will check blood cultures x2      sets.  I wonder if she is becoming uroseptic and that is why her      blood pressure is low versus adrenal insufficiency.  The patient      will be placed      initially on vancomycin, ceftriaxone and Zithromax.  8. Deep venous thrombosis prophylaxis:  The patient is on Lovenox.   CRITICAL-CARE TIME:  60 minutes.      Massie Maroon, MD  Electronically Signed     JYK/MEDQ  D:  05/31/2009  T:  05/31/2009  Job:  [id number]   cc:   Coletta Memos,  M.D.  Fax:  [phone]   Peyton Najjar, MD   Genene Churn. Cyndie Chime, M.D.  Fax: [phone]   Billie Lade, M.D.  Fax: [phone]

## 2011-03-31 NOTE — Op Note (Signed)
[name], FREET NO.:  [id number]   MEDICAL RECORD NO.:  [id number]          PATIENT TYPE:  INP   LOCATION:  3111                         FACILITY:  [facility]   PHYSICIAN:  Mercedes Barnes, M.D.     DATE OF BIRTH:  07-29-1961   DATE OF PROCEDURE:  02/20/2009  DATE OF DISCHARGE:                               OPERATIVE REPORT   PREOPERATIVE DIAGNOSIS:  Right basal ganglia mass.   POSTOPERATIVE DIAGNOSIS:  Right basal ganglia tumor.   SURGEON:  Mercedes Memos, MD   ASSISTANT:  Mercedes Loron, MD   ANESTHESIA:  General endotracheal.   FINDINGS:  Mercedes Barnes proteinaceous clear fluid aspirated from cyst wall,  grayish and dark.   INDICATIONS:  Mercedes Barnes is a 50 year old who over the past few weeks  has had increasing lethargy.  This weekend, she became markedly pruritic  on her left side.  As a result, she went to see her family physician.  An MRI performed on Monday showed a large cystic mass, centered in right  basal ganglion, but extending into the anterior temporal lobe and also  into the deeper frontal lobe.  She was taken to the operating room today  for an open biopsy.   OPERATIVE NOTE:  Mercedes Barnes was brought to the operating room,  intubated and placed under general anesthetic.  She was positioned with  the head in the [place] head holder to approximately 70 pounds of  pressure, turned towards the right so that the frontotemporal parietal  region was parallel to the floor.  I shaved, prepped, and draped her  head.  I used a reverse ?trending behind the ear and came up lateral to  the midline.  I opened the flap and reflected that anteriorly.  I then  turned a flap of temporalis muscle, took the temporalis muscle and also  reflected that anteriorly leaving a cuff to sew to.  I then placed bur  holes and then connected the bur holes with craniotome.  I lifted the  bone flap.  I then placed dural tack-up sutures.  I then finally opened  the dura and  found what was a very tense brain with flattened gyri and  sulci.  Using the MRI scan, then performed a very small cordotomy with  use of brain needle and then aspirated from the cyst or one of the cyst  golden clear, somewhat thick fluid.  I sent that for cytology.  I then  proceeded to measure from the anterior temporal lobe to my corticotomy  and it was approximately 3 to 3.25 cm posteriorly.  Knowing that this  was a safe distance, I then proceeded with an anterior temporal  lobectomy.  I used for the most part suction and the bipolar cautery.  I  did send a large piece of temporal lobe anteriorly for specimen.  I did  not go so medial as to see the brain stem, but I did remove good portion  of that temporal lobe leaving the superior temporal gyrus intact.  I did  send a frozen specimen to the laboratory, it was felt  that while it was  not exactly normal, it was not diagnostic.  I sent what was a very small  sample having obtained much larger samples of tissue.  Also, I had sent  the fluid.  I then sent some deeper biopsies from what was readily  apparent to be cyst wall.  At that time, I brought the microscope into  the operative field to aid a microdissection.  Mercedes Barnes did assist  with the dissection.  Not waiting to send another frozen because the  pathologist had informed me during the case it was not at all clear that  we did not have diagnostic tissue.  I felt that I had visual  confirmation of where I was and sent tissue surrounding that area.  I  did open more cyst during my dissection knowing that I then had to be in  intimate contact with the abnormal tissue.  Having done that I then  irrigated and achieved hemostasis without great difficulty.  There was  no significant blood loss during the case.  I then closed the wound,  reapproximating the dura with dural tack ups.  I reapproximated the  temporalis muscle, then reapproximated the bone flap with plates and  screws.  I  then closed the scalp with subgaleal sutures and staples for  the skin edges.  I placed a sterile dressing.  Mercedes Barnes was then  awakened by the Anesthesia Service and extubated.           ______________________________  Mercedes Barnes, M.D.     KC/MEDQ  D:  02/20/2009  T:  02/21/2009  Job:  [id number]

## 2011-03-31 NOTE — Discharge Summary (Signed)
[name], [name] NO.:  [id number]   MEDICAL RECORD NO.:  [id number]          PATIENT TYPE:  INP   LOCATION:  3025                         FACILITY:  [facility]   PHYSICIAN:  Coletta Memos, M.D.     DATE OF BIRTH:  [birth year]   DATE OF ADMISSION:  02/18/2009  DATE OF DISCHARGE:  02/27/2009                               DISCHARGE SUMMARY   ADMITTING DIAGNOSIS:  Right insular cystic tumor.   DISCHARGE DIAGNOSES:  1. High-grade glioma, right insular region.  2. Cystic mass.   PROCEDURES:  Craniotomy x2.  First craniotomy consist of anterior  temporal lobectomy and craniotomy #2 is cyst drainage.   COMPLICATIONS:  None.   DISCHARGE STATUS:  Alive, improved.   Neurologic status shows weakness in left upper extremity and weakness in  left lower extremity.  The left upper extremity is much weaker than the  lower extremity.  Normal strength on the right side.  Left facial droop.  Speech is mildly dysarthric and fluent.  Fund of knowledge is normal.  She is quite awake.   Discharge medications will include Decadron and Keppra.   INDICATION:  Mercedes Barnes is a 50 year old who essentially immediately  before admission was noted to have increasing weakness on the left side  of her body.  She went to see her primary care physician who ordered a  MRI.  MRI revealed a large cystic mass involving the basal ganglia in  the insular region and also portion of the midbrain.  On the right side  with right to left shift, also involving the anterior temporal lobe with  some evidence of uncal herniation.  She was admitted, given Decadron and  IV fluids.  She also had significant emesis prior to admission.  She  underwent a craniotomy that Wednesday on February 20, 2009, and had an  anterior temporal lobectomy no more than 3 cm back sparing the superior  temporal gyrus and drainage of some of the cyst.  Postoperatively, she  had a depressed level of consciousness and remained weak on  the left  side.  Repeat scan showed that though I drained one of the inferior  cyst, two of the large cyst superiorly were not drained.  I therefore  secondary to her declining mental status, took her back to the operating  room, performed another craniotomies slowly for the purpose of draining  the cyst with ultrasound guidance.  I had tried to use Stealth on the  first operation, but because the way she turned her head, that study was  not possible.  Point #2 that I spoke with the pathologist prior to the  second operation to ensure that diagnostic material had been obtained  with first operation, so if I needed to, I could have resected more of  the tumor.  I was told by the pathologist on the case that I did have  diagnostic tissue that appeared to be a high-grade glioma, and  therefore, I was very comfortable with simple drainage of the cyst.  She  had that operation and then  subsequently has done quite well.  Her wound was clean, dry, no signs of  infection at discharge.  She has arranged to see Dr. Neale Burly at [facility].  I  had her seen by the oncologist here at [facility].  She was also seen by  the radiation oncologist and a follow up is set up.  She will come back  to see me next week for staple removal.           ______________________________  Coletta Memos, M.D.     KC/MEDQ  D:  02/27/2009  T:  02/28/2009  Job:  [id number]

## 2011-03-31 NOTE — Discharge Summary (Signed)
Mercedes, Barnes               ACCOUNT NO.:  [id number]   MEDICAL RECORD NO.:  [id number]          PATIENT TYPE:  INP   LOCATION:  1436                         FACILITY:  [facility]   PHYSICIAN:  Peggye Pitt, M.D. DATE OF BIRTH:  06/08/1961   DATE OF ADMISSION:  05/31/2009  DATE OF DISCHARGE:  06/03/2009                               DISCHARGE SUMMARY   DISCHARGE DIAGNOSES:  1. Near syncope and hypotension secondary to adrenal insufficiency.  2. Adrenal insufficiency.  3. Urinary tract infection.  4. Pneumonia.  5. Hypothyroidism.  6. History of glioblastoma of the brain, right insular region, status      post chemotherapy; due to start radiation this week.  7. Recent diagnosis of pulmonary embolism on May 17, 2009.   DISCHARGE MEDICATIONS:  1. Cipro 250 mg twice daily for 7 days.  2. Decadron 4 mg every 6 hours.  3. Keppra 500 mg twice daily.  4. Synthroid 125 mcg daily.  5. Pentamidine 250 mg daily for four more days.  6. Lovenox 60 mg injected subcutaneously every 12 hours.  7. Zofran 8 mg every 6 hours as needed for nausea.   DISPOSITION AND FOLLOW UP:  Mercedes Barnes will be discharged home today  in stable condition.  Mercedes Barnes has been fully ambulatory in the hospital.  Mercedes Barnes blood pressure has normalized.  I have instructed Mercedes Barnes and Mercedes Barnes  husband, Mercedes Barnes, to call Mercedes Barnes primary care physician in 2 weeks.  At that  time, I believe that a very slow steroid taper should be initiated.   CONSULTATIONS THIS HOSPITALIZATION:  None.   IMAGES AND PROCEDURES:  1. A chest x-ray on May 30, 2009 that showed a mild bibasilar      atelectasis.  2. A chest x-ray on May 31, 2009 that showed right lower lobe      atelectasis/infiltrate.  3. A CT scan of the head on May 31, 2009 that showed postoperative      changes with slightly decreased size of complex solids and cystic      air within the right basal ganglia.  No evidence of acute      intracranial abnormality.  4. An abdominal  ultrasound on June 01, 2009 that showed mild fatty      infiltration of the liver; otherwise unremarkable.   HISTORY AND PHYSICAL EXAMINATION:  For full details, please refer to  dictation by Dr. Selena Batten on May 31, 2009.  In brief, Mercedes Barnes is a  very pleasant 50 year old Caucasian lady who in April of this was  diagnosed with a brain glioblastoma.  Mercedes Barnes was recently at [facility] where Mercedes Barnes  was started on chemotherapy and completed a course of chemotherapy.  Mercedes Barnes  is scheduled to start radiation this week with Dr. Roselind Messier.  Mercedes Barnes  presented to the hospital with complaints of a near syncope.  Mercedes Barnes was  walking in the hallway in Mercedes Barnes house, and suddenly Mercedes Barnes became very dizzy,  almost fell down, felt like Mercedes Barnes vision became blurred and black,  thought Mercedes Barnes was going to pass out, sat down, and subsequently Mercedes Barnes felt  better.  However,  Mercedes Barnes husband brought Mercedes Barnes into the hospital for an  evaluation where Mercedes Barnes was found to have a systolic blood pressure in the  60s and a urinary tract infection, and Mercedes Barnes was admitted to our service  for further evaluation and management.   HOSPITAL COURSE BY ACTIVE PROBLEM:  1. Near syncope and hypotension.  After further workup, Mercedes Barnes was found      to have an extremely low cortisone level at 0.3. Diagnosis at this      point was possible adrenal insufficiency.  Mercedes Barnes blood pressure has      normalized on increased dose of steroids.  See below for further      details.  2. Adrenal insufficiency.  Mercedes Barnes was found to have a cortisone level of      0.3.  Of note, Mercedes Barnes had been discharged from [facility] with a steroid      taper.  Mercedes Barnes was down to 2 mg in the morning and 1 mg in the      evening.  I believe that possibly Mercedes Barnes near syncope and hypotension      was secondary to some adrenal insufficiency in a patient who was      recently coming down on steroids and who has the extra stress of a      urinary tract infection.  Mercedes Barnes will be discharged on Decadron 4 mg      four times a day.  Mercedes Barnes  has been instructed to call Mercedes Barnes primary care      physician, Dr. Alwyn Ren, within the next 2 weeks for a hospital      follow-up appointment.  I suggest that in 2 weeks Mercedes Barnes begin a very      slow steroid taper as indicated by Dr. Alwyn Ren.  3. For Mercedes Barnes proteus urinary tract infection, it is sensitive to Cipro,      so will send Mercedes Barnes home on Cipro 250 mg p.o. b.i.d. for 7 days.  4. Pneumonia.  Mercedes Barnes had a pneumonia recently diagnosed at [facility] and was      sent home on pentamidine.  Mercedes Barnes has four more days left of that      treatment to complete.  Mercedes Barnes was initially placed on Rocephin and      azithromycin while in the hospital.  However, chest x-ray was more      consistent with atelectasis than pneumonia, so this is probably a      resolving pneumonia from Mercedes Barnes prior hospitalization.  No further      antibiotics for this.  5. Hypothyroidism.  Mercedes Barnes TSH was elevated at 6.129.  Hence, Mercedes Barnes      Synthroid dose was increased to 125 mcg daily.  Mercedes Barnes will need a      repeat TSH in 4-6 weeks.  6. For Mercedes Barnes brain glioblastoma, Mercedes Barnes is to start radiation this week.  7. The rest of chronic medical issues have not been a problem this      hospitalization.   VITAL SIGNS ON DAY OF DISCHARGE:  Blood pressure 132/85, heart rate 52,  respirations 18, O2 saturation 95% on room air with a temperature of  97.2.      Peggye Pitt, M.D.  Electronically Signed     EH/MEDQ  D:  06/03/2009  T:  06/03/2009  Job:  [id number]   cc:   PCP - Dr. Tomasa Hose, M.D.  Fax: [phone]

## 2011-03-31 NOTE — H&P (Signed)
NAMEANWITHA, MAPES NO.:  [id number]   MEDICAL RECORD NO.:  [id number]          PATIENT TYPE:  INP   LOCATION:  3111                         FACILITY:  [facility]   PHYSICIAN:  Coletta Memos, M.D.     DATE OF BIRTH:  March 14, 1961   DATE OF ADMISSION:  02/18/2009  DATE OF DISCHARGE:                              HISTORY & PHYSICAL   ADMISSION DIAGNOSES:  Right cystic mass, right basal ganglia cystic mass  involving temporal tip of thalamus and midbrain.   INDICATIONS:  Ms. Nelligan is a 50 year old woman who presented with a  history of fatigue over the last few months.  She did not complain of  significant headaches, but which she described is something like a sinus  headache.  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  This past  weekend, she became quite weak on her left side.  She also had multiple  episodes of emesis during this time.  She had projectile vomiting only  yesterday.  She went to see a physician on February 18, 2009, Dr. Alwyn Ren and  he ordered an MRI scan.  MRI scan revealed a large cystic mass in right  basal ganglia with midline shift.  I was contacted while she was still  in the MRI scanner and then I went down to see evaluator and admit her.   ALLERGIES:  No known drug allergies.   ADMITTING MEDICATIONS:  Levothyroxine 112 mcg daily and diclofenac  sodium.   SOCIAL HISTORY:  She is married, with children, and has been working as  a Conservation officer, nature.  Smoking approximately one year ago.  She has never done any  illicit drugs.  She does drink alcohol occasionally.  She has been able  to workup until the last week.  She has significant problems with  vision, great difficulty with typing.   REVIEW OF SYSTEMS:  Outside of the lethargy, the sinus headache and the  vomiting.  She has had no other constitutional problems.  Respiratory,  genitourinary have been okay.  Cardiac, skin,  psychiatric without  problems.  She is hypothyroid.  No other allergies.   PHYSICAL EXAMINATION:  GENERAL:  She is alert with her eyes partially  close.  She has dense left hemiparesis.  Significant drift.  HEENT:  Pupils are not equal, left pupil slightly dilated, round and  regular.  It is reactive to light.  She has left facial droop.  Tongue  protrudes in midline.  Shoulder shrug is performed bilaterally.  LUNGS:  Lung fields are clear.  HEART:  Regular rhythm and rate, grade III systolic murmur is  appreciated on examination.  EXTREMITIES:  Pulses good at the wrists and feet bilaterally.  No  clubbing, cyanosis, or edema.  ABDOMEN:  Soft, nontender.  Bowel sounds are present.  NEUROLOGIC:  She is following all commands.  Speech is clear.   MRI shows a large cystic mass infiltrating the right basal ganglia, also  seems to extend into the mid brain through the thalamus into the  anterior temporal lobe in the frontal and parietal regions also.  The  radiologist may mention that it appears that it may involve the optic  nerve to my eye it appears that it is also involving the lateral aspect  of the right mid brain.  Significant amount of edema extends from  frontal, temporal into the occipital lobes.  There is already distortion  of the brain stem because of pressure from the right temporal region.  The caudate is certainly involved.  There is a ventricular effacement on  the right side.   I will admit Mrs. Skillin.  I will plan on doing a procedure to obtain  a diagnosis this coming Wednesday.  I will start her on IV Decadron,  which I think will relieve some of the pressure.  Also, we will give her  IV with normal saline since she has not been able to eat.  She will be  admitted to the Neuro Intensive Care Unit.  Her husband was with her  downstairs in the MRI where I explained the rationale for her being  admitted and the plan.  They understand and agree.            ______________________________  Coletta Memos, M.D.     KC/MEDQ  D:  02/19/2009  T:  02/20/2009  Job:  [id number]

## 2011-04-10 ENCOUNTER — Emergency Department (HOSPITAL_COMMUNITY): Payer: Managed Care, Other (non HMO)

## 2011-04-10 ENCOUNTER — Emergency Department (HOSPITAL_COMMUNITY)
Admission: EM | Admit: 2011-04-10 | Discharge: 2011-04-10 | Disposition: A | Discharge status: Home / Self Care | Payer: Managed Care, Other (non HMO) | Attending: Emergency Medicine | Admitting: Emergency Medicine

## 2011-04-10 DIAGNOSIS — Y9229 Other specified public building as the place of occurrence of the external cause: Secondary | ICD-10-CM | POA: Insufficient documentation

## 2011-04-10 DIAGNOSIS — T148XXA Other injury of unspecified body region, initial encounter: Secondary | ICD-10-CM | POA: Insufficient documentation

## 2011-04-10 DIAGNOSIS — IMO0002 Reserved for concepts with insufficient information to code with codable children: Secondary | ICD-10-CM | POA: Insufficient documentation

## 2011-04-10 DIAGNOSIS — M25579 Pain in unspecified ankle and joints of unspecified foot: Secondary | ICD-10-CM | POA: Insufficient documentation

## 2011-04-10 DIAGNOSIS — S0990XA Unspecified injury of head, initial encounter: Secondary | ICD-10-CM | POA: Insufficient documentation

## 2011-04-10 DIAGNOSIS — W010XXA Fall on same level from slipping, tripping and stumbling without subsequent striking against object, initial encounter: Secondary | ICD-10-CM | POA: Insufficient documentation

## 2011-04-10 LAB — PROTIME-INR: Prothrombin Time: 26.3 seconds — ABNORMAL HIGH (ref 11.6–15.2)

## 2011-04-20 ENCOUNTER — Other Ambulatory Visit: Payer: Self-pay | Admitting: Oncology

## 2011-04-20 ENCOUNTER — Encounter (HOSPITAL_BASED_OUTPATIENT_CLINIC_OR_DEPARTMENT_OTHER): Payer: Managed Care, Other (non HMO) | Admitting: Oncology

## 2011-04-20 DIAGNOSIS — Z5112 Encounter for antineoplastic immunotherapy: Secondary | ICD-10-CM

## 2011-04-20 DIAGNOSIS — C7931 Secondary malignant neoplasm of brain: Secondary | ICD-10-CM

## 2011-04-20 DIAGNOSIS — C710 Malignant neoplasm of cerebrum, except lobes and ventricles: Secondary | ICD-10-CM

## 2011-04-20 LAB — CBC WITH DIFFERENTIAL/PLATELET
BASO%: 0.2 % (ref 0.0–2.0)
Eosinophils Absolute: 0.1 10*3/uL (ref 0.0–0.5)
MCHC: 33.7 g/dL (ref 31.5–36.0)
MCV: 90.9 fL (ref 79.5–101.0)
MONO%: 8.4 % (ref 0.0–14.0)
NEUT#: 2.6 10*3/uL (ref 1.5–6.5)
RBC: 4.28 10*6/uL (ref 3.70–5.45)
RDW: 14.1 % (ref 11.2–14.5)
WBC: 5 10*3/uL (ref 3.9–10.3)
nRBC: 0 % (ref 0–0)

## 2011-04-20 LAB — COMPREHENSIVE METABOLIC PANEL
Alkaline Phosphatase: 79 U/L (ref 39–117)
BUN: 13 mg/dL (ref 6–23)
Glucose, Bld: 76 mg/dL (ref 70–99)
Sodium: 137 mEq/L (ref 135–145)
Total Bilirubin: 0.3 mg/dL (ref 0.3–1.2)
Total Protein: 6.9 g/dL (ref 6.0–8.3)

## 2011-04-20 LAB — PROTIME-INR
INR: 2.6 (ref 2.00–3.50)
Protime: 31.2 Seconds — ABNORMAL HIGH (ref 10.6–13.4)

## 2011-05-18 ENCOUNTER — Other Ambulatory Visit: Payer: Self-pay | Admitting: Oncology

## 2011-05-18 ENCOUNTER — Encounter (HOSPITAL_BASED_OUTPATIENT_CLINIC_OR_DEPARTMENT_OTHER): Payer: Managed Care, Other (non HMO) | Admitting: Oncology

## 2011-05-18 DIAGNOSIS — C710 Malignant neoplasm of cerebrum, except lobes and ventricles: Secondary | ICD-10-CM

## 2011-05-18 DIAGNOSIS — Z5112 Encounter for antineoplastic immunotherapy: Secondary | ICD-10-CM

## 2011-05-18 DIAGNOSIS — Z5111 Encounter for antineoplastic chemotherapy: Secondary | ICD-10-CM

## 2011-05-18 DIAGNOSIS — C7931 Secondary malignant neoplasm of brain: Secondary | ICD-10-CM

## 2011-05-18 LAB — COMPREHENSIVE METABOLIC PANEL
Albumin: 4 g/dL (ref 3.5–5.2)
BUN: 10 mg/dL (ref 6–23)
Calcium: 9.1 mg/dL (ref 8.4–10.5)
Chloride: 105 mEq/L (ref 96–112)
Creatinine, Ser: 0.56 mg/dL (ref 0.50–1.10)
Glucose, Bld: 81 mg/dL (ref 70–99)
Potassium: 4.1 mEq/L (ref 3.5–5.3)

## 2011-05-18 LAB — CBC WITH DIFFERENTIAL/PLATELET
Basophils Absolute: 0 10*3/uL (ref 0.0–0.1)
Eosinophils Absolute: 0.1 10*3/uL (ref 0.0–0.5)
HGB: 13.8 g/dL (ref 11.6–15.9)
NEUT#: 3.2 10*3/uL (ref 1.5–6.5)
RDW: 14.2 % (ref 11.2–14.5)
lymph#: 1.7 10*3/uL (ref 0.9–3.3)

## 2011-05-18 LAB — PROTIME-INR: INR: 4 — ABNORMAL HIGH (ref 2.00–3.50)

## 2011-05-18 LAB — LACTATE DEHYDROGENASE: LDH: 174 U/L (ref 94–250)

## 2011-05-22 ENCOUNTER — Encounter (HOSPITAL_BASED_OUTPATIENT_CLINIC_OR_DEPARTMENT_OTHER): Payer: Managed Care, Other (non HMO) | Admitting: Oncology

## 2011-05-22 ENCOUNTER — Other Ambulatory Visit: Payer: Self-pay | Admitting: Oncology

## 2011-05-22 DIAGNOSIS — C710 Malignant neoplasm of cerebrum, except lobes and ventricles: Secondary | ICD-10-CM

## 2011-05-22 DIAGNOSIS — Z86718 Personal history of other venous thrombosis and embolism: Secondary | ICD-10-CM

## 2011-05-22 DIAGNOSIS — Z7901 Long term (current) use of anticoagulants: Secondary | ICD-10-CM

## 2011-05-22 DIAGNOSIS — Z5181 Encounter for therapeutic drug level monitoring: Secondary | ICD-10-CM

## 2011-05-22 LAB — PROTIME-INR
INR: 3 (ref 2.00–3.50)
Protime: 36 Seconds — ABNORMAL HIGH (ref 10.6–13.4)

## 2011-06-15 ENCOUNTER — Other Ambulatory Visit: Payer: Self-pay | Admitting: Oncology

## 2011-06-15 ENCOUNTER — Encounter (HOSPITAL_BASED_OUTPATIENT_CLINIC_OR_DEPARTMENT_OTHER): Payer: Managed Care, Other (non HMO) | Admitting: Oncology

## 2011-06-15 DIAGNOSIS — Z5112 Encounter for antineoplastic immunotherapy: Secondary | ICD-10-CM

## 2011-06-15 DIAGNOSIS — C710 Malignant neoplasm of cerebrum, except lobes and ventricles: Secondary | ICD-10-CM

## 2011-06-15 DIAGNOSIS — C7931 Secondary malignant neoplasm of brain: Secondary | ICD-10-CM

## 2011-06-15 DIAGNOSIS — I10 Essential (primary) hypertension: Secondary | ICD-10-CM

## 2011-06-15 LAB — CBC WITH DIFFERENTIAL/PLATELET
BASO%: 0.3 % (ref 0.0–2.0)
Basophils Absolute: 0 10e3/uL (ref 0.0–0.1)
EOS%: 1.3 % (ref 0.0–7.0)
Eosinophils Absolute: 0.1 10e3/uL (ref 0.0–0.5)
HCT: 39.3 % (ref 34.8–46.6)
HGB: 13.5 g/dL (ref 11.6–15.9)
LYMPH%: 39.6 % (ref 14.0–49.7)
MCH: 31.3 pg (ref 25.1–34.0)
MCHC: 34.4 g/dL (ref 31.5–36.0)
MCV: 91 fL (ref 79.5–101.0)
MONO#: 0.6 10e3/uL (ref 0.1–0.9)
MONO%: 9.7 % (ref 0.0–14.0)
NEUT#: 2.9 10e3/uL (ref 1.5–6.5)
NEUT%: 49.1 % (ref 38.4–76.8)
Platelets: 223 10e3/uL (ref 145–400)
RBC: 4.32 10e6/uL (ref 3.70–5.45)
RDW: 13.8 % (ref 11.2–14.5)
WBC: 6 10e3/uL (ref 3.9–10.3)
lymph#: 2.4 10e3/uL (ref 0.9–3.3)

## 2011-06-15 LAB — UA PROTEIN, DIPSTICK - CHCC: Protein, Urine: NEGATIVE mg/dL

## 2011-06-15 LAB — PROTIME-INR
INR: 2.1 (ref 2.00–3.50)
Protime: 25.2 s — ABNORMAL HIGH (ref 10.6–13.4)

## 2011-06-16 ENCOUNTER — Encounter (HOSPITAL_BASED_OUTPATIENT_CLINIC_OR_DEPARTMENT_OTHER): Payer: Managed Care, Other (non HMO) | Admitting: Oncology

## 2011-06-16 DIAGNOSIS — C710 Malignant neoplasm of cerebrum, except lobes and ventricles: Secondary | ICD-10-CM

## 2011-06-16 DIAGNOSIS — Z5111 Encounter for antineoplastic chemotherapy: Secondary | ICD-10-CM

## 2011-07-13 ENCOUNTER — Other Ambulatory Visit: Payer: Self-pay | Admitting: Oncology

## 2011-07-13 ENCOUNTER — Encounter (HOSPITAL_BASED_OUTPATIENT_CLINIC_OR_DEPARTMENT_OTHER): Payer: Managed Care, Other (non HMO) | Admitting: Oncology

## 2011-07-13 DIAGNOSIS — Z5112 Encounter for antineoplastic immunotherapy: Secondary | ICD-10-CM

## 2011-07-13 DIAGNOSIS — C710 Malignant neoplasm of cerebrum, except lobes and ventricles: Secondary | ICD-10-CM

## 2011-07-13 LAB — PROTIME-INR
INR: 2.2 (ref 2.00–3.50)
Protime: 26.4 Seconds — ABNORMAL HIGH (ref 10.6–13.4)

## 2011-07-13 LAB — COMPREHENSIVE METABOLIC PANEL
ALT: 18 U/L (ref 0–35)
AST: 22 U/L (ref 0–37)
Alkaline Phosphatase: 71 U/L (ref 39–117)
Calcium: 10.2 mg/dL (ref 8.4–10.5)
Chloride: 98 mEq/L (ref 96–112)
Creatinine, Ser: 0.47 mg/dL — ABNORMAL LOW (ref 0.50–1.10)
Total Bilirubin: 0.2 mg/dL — ABNORMAL LOW (ref 0.3–1.2)

## 2011-07-13 LAB — CBC WITH DIFFERENTIAL/PLATELET
BASO%: 0.2 % (ref 0.0–2.0)
HCT: 37.3 % (ref 34.8–46.6)
HGB: 12.9 g/dL (ref 11.6–15.9)
MCHC: 34.6 g/dL (ref 31.5–36.0)
MONO#: 0.6 10*3/uL (ref 0.1–0.9)
NEUT%: 48.4 % (ref 38.4–76.8)
WBC: 5.8 10*3/uL (ref 3.9–10.3)
lymph#: 2.3 10*3/uL (ref 0.9–3.3)

## 2011-07-13 LAB — LACTATE DEHYDROGENASE: LDH: 228 U/L (ref 94–250)

## 2011-08-10 ENCOUNTER — Other Ambulatory Visit: Payer: Self-pay | Admitting: Oncology

## 2011-08-10 ENCOUNTER — Encounter (HOSPITAL_BASED_OUTPATIENT_CLINIC_OR_DEPARTMENT_OTHER): Payer: Managed Care, Other (non HMO) | Admitting: Oncology

## 2011-08-10 DIAGNOSIS — Z5112 Encounter for antineoplastic immunotherapy: Secondary | ICD-10-CM

## 2011-08-10 DIAGNOSIS — Z5111 Encounter for antineoplastic chemotherapy: Secondary | ICD-10-CM

## 2011-08-10 DIAGNOSIS — I2699 Other pulmonary embolism without acute cor pulmonale: Secondary | ICD-10-CM

## 2011-08-10 DIAGNOSIS — C710 Malignant neoplasm of cerebrum, except lobes and ventricles: Secondary | ICD-10-CM

## 2011-08-10 LAB — COMPREHENSIVE METABOLIC PANEL
Albumin: 4.6 g/dL (ref 3.5–5.2)
BUN: 12 mg/dL (ref 6–23)
Calcium: 9.6 mg/dL (ref 8.4–10.5)
Chloride: 96 mEq/L (ref 96–112)
Glucose, Bld: 80 mg/dL (ref 70–99)
Potassium: 4.1 mEq/L (ref 3.5–5.3)

## 2011-08-10 LAB — CBC WITH DIFFERENTIAL/PLATELET
BASO%: 0.3 % (ref 0.0–2.0)
EOS%: 1.2 % (ref 0.0–7.0)
MCH: 31 pg (ref 25.1–34.0)
MCV: 91.4 fL (ref 79.5–101.0)
MONO%: 13.8 % (ref 0.0–14.0)
RBC: 4.42 10*6/uL (ref 3.70–5.45)
RDW: 13.9 % (ref 11.2–14.5)
lymph#: 2.1 10*3/uL (ref 0.9–3.3)
nRBC: 0 % (ref 0–0)

## 2011-08-10 LAB — PROTIME-INR
INR: 2.1 (ref 2.00–3.50)
Protime: 25.2 Seconds — ABNORMAL HIGH (ref 10.6–13.4)

## 2011-08-10 LAB — UA PROTEIN, DIPSTICK - CHCC: Protein, Urine: NEGATIVE mg/dL

## 2011-09-07 ENCOUNTER — Other Ambulatory Visit: Payer: Self-pay | Admitting: Oncology

## 2011-09-07 ENCOUNTER — Encounter (HOSPITAL_BASED_OUTPATIENT_CLINIC_OR_DEPARTMENT_OTHER): Payer: Managed Care, Other (non HMO) | Admitting: Oncology

## 2011-09-07 DIAGNOSIS — Z5112 Encounter for antineoplastic immunotherapy: Secondary | ICD-10-CM

## 2011-09-07 DIAGNOSIS — Z5111 Encounter for antineoplastic chemotherapy: Secondary | ICD-10-CM

## 2011-09-07 DIAGNOSIS — C710 Malignant neoplasm of cerebrum, except lobes and ventricles: Secondary | ICD-10-CM

## 2011-09-07 LAB — LACTATE DEHYDROGENASE: LDH: 240 U/L (ref 94–250)

## 2011-09-07 LAB — COMPREHENSIVE METABOLIC PANEL
ALT: 18 U/L (ref 0–35)
CO2: 22 mEq/L (ref 19–32)
Calcium: 9.9 mg/dL (ref 8.4–10.5)
Chloride: 98 mEq/L (ref 96–112)
Sodium: 131 mEq/L — ABNORMAL LOW (ref 135–145)
Total Protein: 7.2 g/dL (ref 6.0–8.3)

## 2011-09-07 LAB — PROTIME-INR
INR: 2.5 (ref 2.00–3.50)
Protime: 30 Seconds — ABNORMAL HIGH (ref 10.6–13.4)

## 2011-09-07 LAB — CBC WITH DIFFERENTIAL/PLATELET
EOS%: 1.2 % (ref 0.0–7.0)
MCH: 31.5 pg (ref 25.1–34.0)
MCV: 91.6 fL (ref 79.5–101.0)
MONO%: 9.3 % (ref 0.0–14.0)
RBC: 4.51 10*6/uL (ref 3.70–5.45)
RDW: 13.9 % (ref 11.2–14.5)
nRBC: 0 % (ref 0–0)

## 2011-09-07 LAB — UA PROTEIN, DIPSTICK - CHCC: Protein, Urine: <30 mg/dL

## 2011-09-22 ENCOUNTER — Other Ambulatory Visit: Payer: Self-pay | Admitting: *Deleted

## 2011-09-22 DIAGNOSIS — I2699 Other pulmonary embolism without acute cor pulmonale: Secondary | ICD-10-CM

## 2011-09-22 DIAGNOSIS — C719 Malignant neoplasm of brain, unspecified: Secondary | ICD-10-CM

## 2011-09-22 MED ORDER — WARFARIN SODIUM 5 MG PO TABS: 5.0000 mg | ORAL_TABLET | Freq: Every day | ORAL | Status: DC

## 2011-10-01 ENCOUNTER — Other Ambulatory Visit: Payer: Self-pay | Admitting: Oncology

## 2011-10-02 ENCOUNTER — Encounter: Payer: Self-pay | Admitting: Oncology

## 2011-10-02 ENCOUNTER — Other Ambulatory Visit: Payer: Self-pay | Admitting: Oncology

## 2011-10-02 DIAGNOSIS — C712 Malignant neoplasm of temporal lobe: Secondary | ICD-10-CM

## 2011-10-02 DIAGNOSIS — B59 Pneumocystosis: Secondary | ICD-10-CM

## 2011-10-02 DIAGNOSIS — C719 Malignant neoplasm of brain, unspecified: Secondary | ICD-10-CM | POA: Insufficient documentation

## 2011-10-02 NOTE — Progress Notes (Signed)
CC: Mercedes Contes, MD  Posey Boyer, MD  Blair Promise, Ph.D., M.D.  Ashok Pall, M.D.   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  She underwent debulking surgery.  Initial clinical trial with Temodar, irinotecan, Avastin.  Early complications with bilateral pulmonary emboli and diffuse bilateral interstitial pulmonary infiltrates and suspected pneumocystis pneumonia in April 2010.  Subsequent treatment off protocol with cranial irradiation with concomitant low-dose oral Temodar 07/21 through 07/16/2009.  Subsequent maintenance Temodar 200 mg/sq m 5 days each month.  Early progression while on Temodar in December 2010.  Avastin added back at that point with disease stabilization.  12 cycles of adjuvant Temodar plus Avastin completed 08/11/2010.  She is currently on maintenance Avastin 15 mg/kg q.4 weeks.   She remains clinically stable.  She is getting MRIs every 2 months.  Recent followup visit to [place] on 04/23/2011.  MRI with stable known disease over a 2 x 1 x 1.4 cm area and not significantly changed compared to multiple prior studies.  Small amount of stable enhancement just inferior to the cystic part of the lesion within the right medial temporal lobe.     She reports no new symptoms.  Unfortunately, she fell in a local [place] store and struck her head.  She was taken to the emergency department where a CT scan of the brain was done on 04/10/2011.  No acute intracranial hemorrhage or infarction.  Encephalomalacia in the right brain related to her prior surgery with residual partially calcified tissue just anterior to the temporal horn of the lateral ventricle.  This was a noncontrast study.     She finally got in with a gynecologist to be evaluated for recurrent vaginal yeast infections.  She was put on  medication.  She just completed a 7-day course yesterday.  She does not recall the name of the medication.  PHYSICAL EXAMINATION:  General:  She is alert and oriented.  She can spell simple words backwards with ease.  EYES:  Pupils equal, reactive to light.  Optic discs sharp.  Vessels normal.  No hemorrhage or exudate.  NEUROLOGIC:  Motor strength is 5/5.  Reflexes 2+ symmetric.  Fine coordination, rapid alternating movements, finger-to-finger, finger-to-hand all normal.  Vital Signs:  Blood pressure is 138/92, pulse 76 regular.  She is afebrile.  Lungs:  Clear.  Cardiac:  Regular rhythm.  No murmur.  Abdomen:  Soft, nontender.  No mass, no organomegaly.  Extremities:  No edema.  No calf tenderness.  LABORATORY DATA:  Hemoglobin is 14, hematocrit 41, white count 5500, platelets 211,000.  Chem profile pending.  IMPRESSION:  Cystic glioblastoma multiforme on active treatment as outlined above.  She remains clinically and radiographically stable now out over 2 years from diagnosis in April 2010.  PLAN:   1. Continue the monthly Avastin maintenance treatment and q.2 month MRI scans at [facility]. 2. Bilateral pulmonary emboli occurring in July 2010.  Initial prolonged treatment with Lovenox.  Changed to Coumadin this year.  INR running high today at 4.0, likely due to recent antibiotic therapy.  I am going to have her hold the dose today but then resume her regular dose and check a prothrombin time here again on 07/06. 3. History of bilateral interstitial pneumonia occurring around time of initial diagnosis of her brain cancer.  Treated empirically for pneumocystis.  ______________________________ Annia Belt, M.D., F.A.C.P. JMG/MEDQ  D:  05/18/2011  T:  05/18/2011  Job:  [id number]

## 2011-10-05 ENCOUNTER — Ambulatory Visit (HOSPITAL_BASED_OUTPATIENT_CLINIC_OR_DEPARTMENT_OTHER): Payer: Managed Care, Other (non HMO) | Admitting: Oncology

## 2011-10-05 ENCOUNTER — Other Ambulatory Visit: Payer: Self-pay | Admitting: Oncology

## 2011-10-05 ENCOUNTER — Ambulatory Visit (HOSPITAL_BASED_OUTPATIENT_CLINIC_OR_DEPARTMENT_OTHER): Payer: Managed Care, Other (non HMO)

## 2011-10-05 ENCOUNTER — Other Ambulatory Visit: Payer: Self-pay

## 2011-10-05 ENCOUNTER — Ambulatory Visit: Payer: Managed Care, Other (non HMO)

## 2011-10-05 ENCOUNTER — Other Ambulatory Visit (HOSPITAL_BASED_OUTPATIENT_CLINIC_OR_DEPARTMENT_OTHER): Payer: Managed Care, Other (non HMO) | Admitting: Lab

## 2011-10-05 VITALS — BP 126/86 | HR 83 | Temp 98.9°F | Ht 63.0 in | Wt 139.0 lb

## 2011-10-05 DIAGNOSIS — C719 Malignant neoplasm of brain, unspecified: Secondary | ICD-10-CM

## 2011-10-05 DIAGNOSIS — I2699 Other pulmonary embolism without acute cor pulmonale: Secondary | ICD-10-CM

## 2011-10-05 DIAGNOSIS — Z5112 Encounter for antineoplastic immunotherapy: Secondary | ICD-10-CM

## 2011-10-05 DIAGNOSIS — C710 Malignant neoplasm of cerebrum, except lobes and ventricles: Secondary | ICD-10-CM

## 2011-10-05 DIAGNOSIS — C712 Malignant neoplasm of temporal lobe: Secondary | ICD-10-CM

## 2011-10-05 DIAGNOSIS — Z7901 Long term (current) use of anticoagulants: Secondary | ICD-10-CM

## 2011-10-05 LAB — CBC WITH DIFFERENTIAL/PLATELET
BASO%: 0.3 % (ref 0.0–2.0)
Basophils Absolute: 0 10*3/uL (ref 0.0–0.1)
HCT: 40 % (ref 34.8–46.6)
HGB: 13.7 g/dL (ref 11.6–15.9)
LYMPH%: 17.1 % (ref 14.0–49.7)
MCH: 31.5 pg (ref 25.1–34.0)
MCHC: 34.1 g/dL (ref 31.5–36.0)
MONO#: 0.8 10*3/uL (ref 0.1–0.9)
NEUT%: 71.5 % (ref 38.4–76.8)
Platelets: 307 10*3/uL (ref 145–400)
lymph#: 1.5 10*3/uL (ref 0.9–3.3)

## 2011-10-05 LAB — UA PROTEIN, DIPSTICK - CHCC: Protein, Urine: <30 mg/dL

## 2011-10-05 LAB — COMPREHENSIVE METABOLIC PANEL
Alkaline Phosphatase: 78 U/L (ref 39–117)
BUN: 13 mg/dL (ref 6–23)
CO2: 27 mEq/L (ref 19–32)
Glucose, Bld: 100 mg/dL — ABNORMAL HIGH (ref 70–99)
Total Bilirubin: 0.4 mg/dL (ref 0.3–1.2)
Total Protein: 7.1 g/dL (ref 6.0–8.3)

## 2011-10-05 LAB — PROTIME-INR

## 2011-10-05 LAB — LACTATE DEHYDROGENASE: LDH: 200 U/L (ref 94–250)

## 2011-10-05 MED ORDER — TRIAMTERENE-HCTZ 37.5-25 MG PO TABS: 1.0000 | ORAL_TABLET | Freq: Every day | ORAL | Status: DC

## 2011-10-05 MED ORDER — SODIUM CHLORIDE 0.9 % IV SOLN
Freq: Once | INTRAVENOUS | Status: AC
  Administered 2011-10-05: 12:00:00 via INTRAVENOUS

## 2011-10-05 MED ORDER — WARFARIN SODIUM 5 MG PO TABS: 5.0000 mg | ORAL_TABLET | ORAL | Status: DC

## 2011-10-05 MED ORDER — BEVACIZUMAB CHEMO INJECTION 400 MG/16ML
15.0000 mg/kg | INTRAVENOUS | Status: DC
  Administered 2011-10-05: 925 mg via INTRAVENOUS
  Filled 2011-10-05: qty 37

## 2011-10-05 MED ORDER — AMLODIPINE BESYLATE 5 MG PO TABS: 5.0000 mg | ORAL_TABLET | Freq: Every day | ORAL | Status: DC

## 2011-10-05 NOTE — Patient Instructions (Signed)
Patient aware of next appointment; patient has Rx for nausea.

## 2011-10-05 NOTE — Progress Notes (Signed)
No problems.  Has had to take Tylenol Cold PRN for a couple days since she picked up a cold from her grandchildren.   INR therapeutic.  Continue current dose.  Recheck INR with next infusion appt.

## 2011-10-05 NOTE — Progress Notes (Signed)
CC: [facility], Fax [phone] Posey Boyer, MD Blair Promise, Ph.D., M.D. Ashok Pall, M.D.   HISTORY OF PRESENT ILLNESS:  [name] is a 50 year old woman diagnosed with a cystic high-grade GBM of the right frontal and parietal lobe of the brain with extension to the basal ganglia April 2010.  She underwent debulking surgery.  She was initially treated on a clinical trial of Temodar, irinotecan and Avastin.  Course was complicated by development of bilateral pulmonary emboli and diffuse bilateral interstitial pulmonary infiltrates and suspected Pneumocystis pneumonia in April 2010.  She was subsequently treated off protocol with cranial radiation with concomitant low-dose oral Temodar 07/21 through 07/16/2009.  Subsequent maintenance Temodar 200 mg per meter squared, 5 days each month.  There was early progression while on Temodar in December 2010.  Avastin was added back at that time with disease stabilization.  She completed 12 cycles of adjuvant Temodar plus Avastin 08/11/2010.  She is currently on maintenance Avastin 15 mg/kg every 4 weeks.  MRI of the brain at [facility] on 08/27/2011 showed a stable postoperative appearance of the brain.  There were no new areas of enhancement.  There was re-demonstration of a cystic lesion within the right basal ganglia with an area of amorphous enhancement just inferior to the cystic lesion within the right medial temporal lobe that was unchanged.   [name] reports that overall she is doing well.  She denies bleeding.  No shortness of breath or chest pain.  No leg swelling or calf pain.  She denies unusual headaches.  No vision changes.  PHYSICAL EXAMINATION:  Vital Signs: Temperature 97.4, heart rate 72, respirations 20, blood pressure 117/81, weight 136.8 pounds (136.7 pounds 08/10/2011).  Pupils equal round and reactive to light.  Extraocular movements intact.  Sclerae anicteric.  Oropharynx is without thrush.  Lungs are clear.  No wheezes or  rales.  Regular cardiac rhythm.  No murmur.  Abdomen is soft and nontender.  No organomegaly.  Extremities without edema.  Calves are soft and nontender.  Motor strength is 5/5.  Knee DTRs 2+, symmetric.  Finger-to-nose intact.  LABORATORY DATA:  Hemoglobin 14.2, white count 8.1, absolute neutrophil count 4.9, platelet count 268,000.  PT 30, INR 2.5.  Chemistry panel and LDH pending.  IMPRESSION/PLAN:   1. Cystic glioblastoma multiforme with previous and current treatment as outlined above.  Most recent MRI of the brain 08/27/2011 was stable. 2. Bilateral pulmonary emboli occurring in July 2010.  The INR is therapeutic.  She will continue Coumadin at the current dose.  She is followed through the [facility]in Clinic in our office. 3. History of bilateral interstitial pneumonia occurring around the time of initial diagnosis of GBM.  She was treated empirically for Pneumocystis. 4. Disposition.  [name] appears stable.  Plan to continue Avastin on a q.4 week schedule.  [name]   will return for a followup visit with Dr. Beryle Beams 10/05/2011.  She will contact the office in the interim with any problems.    ______________________________ Riki Sheer, NP LCT/MEDQ  D:  09/07/2011  T:  09/07/2011  Job:  [id number]   10/05/11  The patient is doing very well at this time.  She continues on a monthly maintenance therapy with Avastin for a cystic glioblastoma of the brain. She is tolerating the product extremely well. We have not seen any toxicity. She had a recent followup visit with an MRI of the brain at [facility] 08/27/2011 and this showed stable postoperative changes with  no change and residual tumor tissue in the area of the right basal ganglia. She denies any headache no change in vision no dysarthria and no focal weakness. She has had an interim bronchitis treated with antibiotics.  On exam head and neck normal lungs clear regular cardiac rhythm no murmur no adenopathy abdomen soft nontender no  mass no organomegaly extremities no edema no calf tenderness neurologic mental status intact pupils equal reactive to light optic discs sharp vessels normal no hemorrhage or exudate motor strength is 5 over 5 reflexes 3+ symmetric upper body coordination normal mild clumsiness on rapid alternating movements and finger-to-finger exam left-hand unchanged from prior exam  Impression: #1 high-grade cystic glioblastoma involving the right frontal and parietal lobes of the brain with extension to basal ganglia initially diagnosed April 2010 status post debulking surgery. Initial induction chemotherapy plus Avastin on a [facility] clinical trial. Study aborted after 2 months to do early toxicity with bilateral interstitial pneumonia and pulmonary emboli. Subsequent standard treatment with radiation with low-dose oral Temodar followed by maintenance dose Temodar signs of early progression in December 2010. A Avastin added back to the regimen with nice stabilization and no further signs of progression and now for 2 years. Plan is continue monthly A. Avastin.  #2. Acute pulmonary embolism occurring in April 2010. She received low molecular weight heparin subcutaneous until all initial treatments were completed. She is now on therapeutic dose Coumadin.  Given the stability of her scans and clinical status we might be able to stop her Coumadin soon.  #3 history of interstitial pneumonitis at time of initial diagnosis suspected pneumocystis infection and treated as this. No significant toxicity from parenteral Pentamadine. (Sulfa allergy) complete resolution over time.  #4. Hypothyroid on replacement.  #5. Essential hypertension on low dose Norvasc 5 mg per

## 2011-10-23 ENCOUNTER — Other Ambulatory Visit: Payer: Self-pay | Admitting: Pharmacist

## 2011-10-23 DIAGNOSIS — I2699 Other pulmonary embolism without acute cor pulmonale: Secondary | ICD-10-CM

## 2011-10-31 ENCOUNTER — Other Ambulatory Visit: Payer: Self-pay | Admitting: Oncology

## 2011-11-02 ENCOUNTER — Other Ambulatory Visit: Payer: Self-pay | Admitting: Oncology

## 2011-11-02 ENCOUNTER — Ambulatory Visit: Payer: Self-pay | Admitting: Oncology

## 2011-11-02 ENCOUNTER — Other Ambulatory Visit (HOSPITAL_BASED_OUTPATIENT_CLINIC_OR_DEPARTMENT_OTHER): Payer: Managed Care, Other (non HMO) | Admitting: Lab

## 2011-11-02 ENCOUNTER — Ambulatory Visit: Payer: Managed Care, Other (non HMO)

## 2011-11-02 ENCOUNTER — Ambulatory Visit (HOSPITAL_BASED_OUTPATIENT_CLINIC_OR_DEPARTMENT_OTHER): Payer: Managed Care, Other (non HMO)

## 2011-11-02 DIAGNOSIS — Z86711 Personal history of pulmonary embolism: Secondary | ICD-10-CM

## 2011-11-02 DIAGNOSIS — C710 Malignant neoplasm of cerebrum, except lobes and ventricles: Secondary | ICD-10-CM

## 2011-11-02 DIAGNOSIS — Z5112 Encounter for antineoplastic immunotherapy: Secondary | ICD-10-CM

## 2011-11-02 DIAGNOSIS — C719 Malignant neoplasm of brain, unspecified: Secondary | ICD-10-CM

## 2011-11-02 DIAGNOSIS — I2699 Other pulmonary embolism without acute cor pulmonale: Secondary | ICD-10-CM

## 2011-11-02 DIAGNOSIS — C712 Malignant neoplasm of temporal lobe: Secondary | ICD-10-CM

## 2011-11-02 LAB — COMPREHENSIVE METABOLIC PANEL
ALT: 17 U/L (ref 0–35)
AST: 26 U/L (ref 0–37)
CO2: 23 mEq/L (ref 19–32)
Calcium: 9.2 mg/dL (ref 8.4–10.5)
Chloride: 100 mEq/L (ref 96–112)
Creatinine, Ser: 0.62 mg/dL (ref 0.50–1.10)
Sodium: 133 mEq/L — ABNORMAL LOW (ref 135–145)
Total Bilirubin: 0.3 mg/dL (ref 0.3–1.2)
Total Protein: 7 g/dL (ref 6.0–8.3)

## 2011-11-02 LAB — CBC WITH DIFFERENTIAL/PLATELET
Eosinophils Absolute: 0.1 10*3/uL (ref 0.0–0.5)
HGB: 13.7 g/dL (ref 11.6–15.9)
LYMPH%: 36 % (ref 14.0–49.7)
MONO#: 0.5 10*3/uL (ref 0.1–0.9)
NEUT#: 2.6 10*3/uL (ref 1.5–6.5)
Platelets: 259 10*3/uL (ref 145–400)
RBC: 4.37 10*6/uL (ref 3.70–5.45)
WBC: 5.1 10*3/uL (ref 3.9–10.3)
nRBC: 0 % (ref 0–0)

## 2011-11-02 LAB — POCT INR: INR: 2.5

## 2011-11-02 LAB — PROTIME-INR: Protime: 30 Seconds — ABNORMAL HIGH (ref 10.6–13.4)

## 2011-11-02 LAB — UA PROTEIN, DIPSTICK - CHCC: Protein, Urine: NEGATIVE mg/dL

## 2011-11-02 MED ORDER — SODIUM CHLORIDE 0.9 % IV SOLN
15.0000 mg/kg | INTRAVENOUS | Status: DC
  Administered 2011-11-02: 925 mg via INTRAVENOUS
  Filled 2011-11-02: qty 37

## 2011-11-02 MED ORDER — SODIUM CHLORIDE 0.9 % IV SOLN
Freq: Once | INTRAVENOUS | Status: AC
  Administered 2011-11-02: 11:00:00 via INTRAVENOUS

## 2011-11-27 ENCOUNTER — Other Ambulatory Visit: Payer: Self-pay | Admitting: Oncology

## 2011-11-30 ENCOUNTER — Ambulatory Visit: Payer: Self-pay | Admitting: Oncology

## 2011-11-30 ENCOUNTER — Telehealth: Payer: Self-pay | Admitting: Oncology

## 2011-11-30 ENCOUNTER — Other Ambulatory Visit: Payer: Medicare Other

## 2011-11-30 ENCOUNTER — Ambulatory Visit: Payer: Managed Care, Other (non HMO)

## 2011-11-30 ENCOUNTER — Ambulatory Visit (HOSPITAL_BASED_OUTPATIENT_CLINIC_OR_DEPARTMENT_OTHER): Payer: Medicare Other

## 2011-11-30 ENCOUNTER — Ambulatory Visit: Payer: Medicare Other | Admitting: Nurse Practitioner

## 2011-11-30 VITALS — BP 118/81 | HR 61 | Temp 97.9°F | Ht 63.0 in | Wt 141.0 lb

## 2011-11-30 DIAGNOSIS — C712 Malignant neoplasm of temporal lobe: Secondary | ICD-10-CM

## 2011-11-30 DIAGNOSIS — C719 Malignant neoplasm of brain, unspecified: Secondary | ICD-10-CM

## 2011-11-30 DIAGNOSIS — I2699 Other pulmonary embolism without acute cor pulmonale: Secondary | ICD-10-CM

## 2011-11-30 DIAGNOSIS — Z452 Encounter for adjustment and management of vascular access device: Secondary | ICD-10-CM

## 2011-11-30 LAB — PROTIME-INR: INR: 2.1 (ref 2.00–3.50)

## 2011-11-30 MED ORDER — BEVACIZUMAB CHEMO INJECTION 400 MG/16ML
15.0000 mg/kg | INTRAVENOUS | Status: DC
  Administered 2011-11-30: 925 mg via INTRAVENOUS
  Filled 2011-11-30: qty 37

## 2011-11-30 MED ORDER — SODIUM CHLORIDE 0.9 % IV SOLN
Freq: Once | INTRAVENOUS | Status: AC
  Administered 2011-11-30: 13:00:00 via INTRAVENOUS

## 2011-11-30 NOTE — Telephone Encounter (Signed)
Gv pt appt for [id number] 

## 2011-11-30 NOTE — Progress Notes (Signed)
OFFICE PROGRESS NOTE  Interval history:  [name] returns as scheduled. She continues monthly Avastin. She was last treated 11/02/2011.  [name] reports a recent visit at [facility] with a brain scan. She reports the brain scan looked "good". She feels well. No nausea or vomiting. No vision change. She had a mild headache earlier this morning. She has a good appetite. Bowels moving regularly. No shortness of breath or chest pain. No abdominal pain. She denies leg swelling or calf pain. No bleeding.   Objective: Blood pressure 118/81, pulse 61, temperature 97.9 F (36.6 C), temperature source Oral, height 5\' 3"  (1.6 m), weight 141 lb (63.957 kg).  Pupils equal round and reactive to light. Extraocular movements intact. Oropharynx is without thrush or ulceration. Lungs are clear. No wheezes or rales. Regular cardiac rhythm. Abdomen is soft and nontender. No organomegaly. Extremities are without edema. Calves are soft and nontender. Motor strength is 5 over 5. Knee DTRs 2+, symmetric. Finger to nose intact. Slight clumsiness with rapid alternating movement of the left hand.  Lab Results: Lab Results  Component Value Date   WBC 5.1 11/02/2011   HGB 13.7 11/02/2011   HCT 40.6 11/02/2011   MCV 92.9 11/02/2011   PLT 259 11/02/2011    Chemistry:    Chemistry      Component Value Date/Time   NA 133* 11/02/2011 1026   NA 138 03/20/2009 1454   K 4.2 11/02/2011 1026   K 3.8 03/20/2009 1454   CL 100 11/02/2011 1026   CL 100 03/20/2009 1454   CO2 23 11/02/2011 1026   CO2 27 03/20/2009 1454   BUN 13 11/02/2011 1026   BUN 18 03/20/2009 1454   CREATININE 0.62 11/02/2011 1026   CREATININE 0.6 03/20/2009 1454      Component Value Date/Time   CALCIUM 9.2 11/02/2011 1026   CALCIUM 8.6 03/20/2009 1454   ALKPHOS 59 11/02/2011 1026   ALKPHOS 65 03/20/2009 1454   AST 26 11/02/2011 1026   AST 14 03/20/2009 1454   ALT 17 11/02/2011 1026   BILITOT 0.3 11/02/2011 1026   BILITOT 0.50 03/20/2009 1454        Studies/Results: No results found.  Medications: I have reviewed the patient's current medications.  Assessment/Plan:  1. 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 She underwent debulking surgery. She was initially treated on a clinical trial with Temodar, irinotecan and Avastin. Course was complicated by development of bilateral pulmonary emboli and diffuse bilateral interstitial pulmonary infiltrates and suspected pneumocystis pneumonia April 2010. She was subsequently treated off protocol with cranial radiation with concomitant low-dose oral Temodar 07/21 through 07/16/2009. Subsequent maintenance Temodar 200 mg per meter squared 5 days each month. There was early progression while on Temodar December 2010. Avastin was added back at that time with disease stabilization. She completed 12 cycles of adjuvant Temodar plus Avastin 08/11/2010. She is currently on maintenance Avastin 15 mg per kilogram every 4 weeks. 2. Bilateral pulmonary emboli occurring in July 2010. She is maintained on Coumadin followed through the Coumadin clinic in our office. 3. History of bilateral interstitial pneumonia occurring around the time of initial diagnosis of GBM. She was treated empirically for Pneumocystis. 4. Disposition. [name] appears stable. Plan to continue Avastin on a q.4 week schedule. She will return for a followup visit with Dr. Beryle Beams in 2 months. She will contact the office the  interim with any problems.      Ned Card ANP/GNP-BC

## 2011-11-30 NOTE — Patient Instructions (Signed)
Continue same dose = 5mg daily except 7.5mg on M&F. Recheck INR in 1 month with next Avastin 

## 2011-11-30 NOTE — Progress Notes (Unsigned)
Continue same dose = 5mg  daily except 7.5mg  on M&F. Recheck INR in 1 month with next Avastin

## 2011-12-12 ENCOUNTER — Other Ambulatory Visit: Payer: Self-pay | Admitting: Oncology

## 2011-12-27 ENCOUNTER — Other Ambulatory Visit: Payer: Self-pay | Admitting: Oncology

## 2011-12-28 ENCOUNTER — Ambulatory Visit (HOSPITAL_BASED_OUTPATIENT_CLINIC_OR_DEPARTMENT_OTHER): Payer: Medicare Other

## 2011-12-28 ENCOUNTER — Other Ambulatory Visit: Payer: Self-pay | Admitting: Pharmacist

## 2011-12-28 ENCOUNTER — Ambulatory Visit: Payer: Medicare Other | Admitting: Pharmacist

## 2011-12-28 ENCOUNTER — Other Ambulatory Visit (HOSPITAL_BASED_OUTPATIENT_CLINIC_OR_DEPARTMENT_OTHER): Payer: Medicare Other

## 2011-12-28 DIAGNOSIS — C712 Malignant neoplasm of temporal lobe: Secondary | ICD-10-CM

## 2011-12-28 DIAGNOSIS — Z5111 Encounter for antineoplastic chemotherapy: Secondary | ICD-10-CM

## 2011-12-28 DIAGNOSIS — I2699 Other pulmonary embolism without acute cor pulmonale: Secondary | ICD-10-CM

## 2011-12-28 DIAGNOSIS — C719 Malignant neoplasm of brain, unspecified: Secondary | ICD-10-CM

## 2011-12-28 LAB — CBC WITH DIFFERENTIAL/PLATELET
BASO%: 0.9 % (ref 0.0–2.0)
Basophils Absolute: 0 10*3/uL (ref 0.0–0.1)
EOS%: 1.6 % (ref 0.0–7.0)
HGB: 13.5 g/dL (ref 11.6–15.9)
MCH: 30.9 pg (ref 25.1–34.0)
MCHC: 34.7 g/dL (ref 31.5–36.0)
MCV: 89 fL (ref 79.5–101.0)
MONO%: 12.2 % (ref 0.0–14.0)
RDW: 13.4 % (ref 11.2–14.5)

## 2011-12-28 LAB — PROTIME-INR
INR: 1.9 — ABNORMAL LOW (ref 2.00–3.50)
Protime: 22.8 Seconds — ABNORMAL HIGH (ref 10.6–13.4)

## 2011-12-28 LAB — COMPREHENSIVE METABOLIC PANEL
Alkaline Phosphatase: 57 U/L (ref 39–117)
BUN: 16 mg/dL (ref 6–23)
Glucose, Bld: 70 mg/dL (ref 70–99)
Sodium: 136 mEq/L (ref 135–145)
Total Bilirubin: 0.4 mg/dL (ref 0.3–1.2)
Total Protein: 6.6 g/dL (ref 6.0–8.3)

## 2011-12-28 LAB — UA PROTEIN, DIPSTICK - CHCC: Protein, Urine: <30 mg/dL

## 2011-12-28 LAB — POCT INR: INR: 1.9

## 2011-12-28 MED ORDER — SODIUM CHLORIDE 0.9 % IV SOLN
Freq: Once | INTRAVENOUS | Status: AC
  Administered 2011-12-28: 11:00:00 via INTRAVENOUS

## 2011-12-28 MED ORDER — SODIUM CHLORIDE 0.9 % IV SOLN
15.0000 mg/kg | INTRAVENOUS | Status: DC
  Administered 2011-12-28: 925 mg via INTRAVENOUS
  Filled 2011-12-28: qty 37

## 2011-12-28 NOTE — Progress Notes (Signed)
At 1105, VSD, RITUXAN started at 45 mls per hour for a volume of 23 mls. Pt instructed to inform nurse at once if having sudden chills, difficulty breathing, or chest pain.

## 2011-12-28 NOTE — Progress Notes (Signed)
Continue same Coumadin dose of 1.5 tablets on Monday and Friday and 1 tablet all other days. Recheck INR with next infusion in 4 weeks.

## 2012-01-25 ENCOUNTER — Ambulatory Visit (HOSPITAL_BASED_OUTPATIENT_CLINIC_OR_DEPARTMENT_OTHER): Payer: Medicare Other | Admitting: Nurse Practitioner

## 2012-01-25 ENCOUNTER — Ambulatory Visit (HOSPITAL_BASED_OUTPATIENT_CLINIC_OR_DEPARTMENT_OTHER): Payer: Medicare Other

## 2012-01-25 ENCOUNTER — Ambulatory Visit: Payer: Self-pay | Admitting: Pharmacist

## 2012-01-25 ENCOUNTER — Other Ambulatory Visit (HOSPITAL_BASED_OUTPATIENT_CLINIC_OR_DEPARTMENT_OTHER): Payer: Medicare Other | Admitting: Lab

## 2012-01-25 VITALS — BP 120/86 | HR 80 | Temp 97.6°F | Ht 63.0 in | Wt 138.2 lb

## 2012-01-25 DIAGNOSIS — C712 Malignant neoplasm of temporal lobe: Secondary | ICD-10-CM

## 2012-01-25 DIAGNOSIS — I2699 Other pulmonary embolism without acute cor pulmonale: Secondary | ICD-10-CM

## 2012-01-25 DIAGNOSIS — Z09 Encounter for follow-up examination after completed treatment for conditions other than malignant neoplasm: Secondary | ICD-10-CM

## 2012-01-25 DIAGNOSIS — Z86711 Personal history of pulmonary embolism: Secondary | ICD-10-CM

## 2012-01-25 DIAGNOSIS — Z5112 Encounter for antineoplastic immunotherapy: Secondary | ICD-10-CM

## 2012-01-25 DIAGNOSIS — C719 Malignant neoplasm of brain, unspecified: Secondary | ICD-10-CM

## 2012-01-25 LAB — COMPREHENSIVE METABOLIC PANEL
BUN: 12 mg/dL (ref 6–23)
CO2: 22 mEq/L (ref 19–32)
Creatinine, Ser: 0.49 mg/dL — ABNORMAL LOW (ref 0.50–1.10)
Glucose, Bld: 99 mg/dL (ref 70–99)
Total Bilirubin: 0.3 mg/dL (ref 0.3–1.2)
Total Protein: 6.5 g/dL (ref 6.0–8.3)

## 2012-01-25 LAB — CBC WITH DIFFERENTIAL/PLATELET
Basophils Absolute: 0 10*3/uL (ref 0.0–0.1)
EOS%: 2.1 % (ref 0.0–7.0)
Eosinophils Absolute: 0.1 10*3/uL (ref 0.0–0.5)
HGB: 13.7 g/dL (ref 11.6–15.9)
LYMPH%: 36.6 % (ref 14.0–49.7)
MCH: 30.7 pg (ref 25.1–34.0)
MCV: 90.8 fL (ref 79.5–101.0)
MONO%: 10.4 % (ref 0.0–14.0)
NEUT#: 2.2 10*3/uL (ref 1.5–6.5)
Platelets: 262 10*3/uL (ref 145–400)

## 2012-01-25 LAB — PROTIME-INR: Protime: 42 Seconds — ABNORMAL HIGH (ref 10.6–13.4)

## 2012-01-25 LAB — UA PROTEIN, DIPSTICK - CHCC: Protein, ur: 30 mg/dL

## 2012-01-25 LAB — LACTATE DEHYDROGENASE: LDH: 182 U/L (ref 94–250)

## 2012-01-25 MED ORDER — BEVACIZUMAB CHEMO INJECTION 400 MG/16ML
15.0000 mg/kg | INTRAVENOUS | Status: DC
  Administered 2012-01-25: 925 mg via INTRAVENOUS
  Filled 2012-01-25: qty 37

## 2012-01-25 MED ORDER — SODIUM CHLORIDE 0.9 % IV SOLN
Freq: Once | INTRAVENOUS | Status: AC
  Administered 2012-01-25: 12:00:00 via INTRAVENOUS

## 2012-01-25 NOTE — Progress Notes (Signed)
OFFICE PROGRESS NOTE  Interval history:  Mercedes Barnes returns as scheduled. She continues monthly Avastin. She feels well. No nausea or vomiting. No headaches. No vision change. She recently tripped over a shelf at a store while shopping and fell. She denies any injury. She has had no other falls. She intermittently notes blood when she blows her nose. She denies shortness of breath and chest pain. No calf pain. She recently had a cold.   Objective: Blood pressure 120/86, pulse 80, temperature 97.6 F (36.4 C), temperature source Oral, height 5\' 3"  (1.6 m), weight 138 lb 3.2 oz (62.687 kg).  Pupils equal and reactive to light. Extraocular movements intact. Sclera anicteric. Oropharynx is without thrush or ulceration. Lungs are clear. No wheezes or rales. Regular cardiac rhythm. Abdomen is soft and nontender. No organomegaly. Extremities are without edema. Calves soft and nontender. Motor strength is 5 over 5. Knee DTRs 2+, symmetric. Finger to nose intact.  Lab Results: Lab Results  Component Value Date   WBC 4.3 01/25/2012   HGB 13.7 01/25/2012   HCT 40.5 01/25/2012   MCV 90.8 01/25/2012   PLT 262 01/25/2012    Chemistry:    Chemistry      Component Value Date/Time   NA 136 12/28/2011 1016   NA 138 03/20/2009 1454   K 4.1 12/28/2011 1016   K 3.8 03/20/2009 1454   CL 100 12/28/2011 1016   CL 100 03/20/2009 1454   CO2 28 12/28/2011 1016   CO2 27 03/20/2009 1454   BUN 16 12/28/2011 1016   BUN 18 03/20/2009 1454   CREATININE 0.58 12/28/2011 1016   CREATININE 0.6 03/20/2009 1454      Component Value Date/Time   CALCIUM 9.6 12/28/2011 1016   CALCIUM 8.6 03/20/2009 1454   ALKPHOS 57 12/28/2011 1016   ALKPHOS 65 03/20/2009 1454   AST 22 12/28/2011 1016   AST 14 03/20/2009 1454   ALT 16 12/28/2011 1016   BILITOT 0.4 12/28/2011 1016   BILITOT 0.50 03/20/2009 1454       Studies/Results: No results found.  Medications: I have reviewed the patient's current medications.  Assessment/Plan:  1. 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 She underwent debulking surgery. She was initially treated on a clinical trial with Temodar, irinotecan and Avastin. Course was complicated by development of bilateral pulmonary emboli and diffuse bilateral interstitial pulmonary infiltrates and suspected pneumocystis pneumonia April 2010. She was subsequently treated off protocol with cranial radiation with concomitant low-dose oral Temodar 07/21 through 07/16/2009. Subsequent maintenance Temodar 200 mg per meter squared 5 days each month. There was early progression while on Temodar December 2010. Avastin was added back at that time with disease stabilization. She completed 12 cycles of adjuvant Temodar plus Avastin 08/11/2010. She is currently on maintenance Avastin 15 mg per kilogram every 4 weeks. 2. Bilateral pulmonary emboli occurring in July 2010. She is maintained on Coumadin. She will meet with the Coumadin pharmacist while at the office today. 3. History of bilateral interstitial pneumonia occurring around the time of initial diagnosis of GBM. She was treated empirically for Pneumocystis. 4. Disposition. Mercedes Barnes appears stable. Plan to continue Avastin on a q.4 week schedule. She will return for a followup visit with Dr. Beryle Beams in 2 months. She will contact the office the interim with any problems. She reports she has a followup visit at [facility] with  an MRI in April.  Ned Card ANP/GNP-BC

## 2012-01-26 ENCOUNTER — Other Ambulatory Visit: Payer: Self-pay | Admitting: Nurse Practitioner

## 2012-01-26 DIAGNOSIS — I10 Essential (primary) hypertension: Secondary | ICD-10-CM

## 2012-01-26 MED ORDER — AMLODIPINE BESYLATE 5 MG PO TABS: 5.0000 mg | ORAL_TABLET | Freq: Every day | ORAL | Status: DC

## 2012-01-26 MED ORDER — TRIAMTERENE-HCTZ 37.5-25 MG PO TABS: 1.0000 | ORAL_TABLET | Freq: Every day | ORAL | Status: DC

## 2012-02-01 ENCOUNTER — Ambulatory Visit: Payer: Medicare Other | Admitting: Oncology

## 2012-02-01 ENCOUNTER — Other Ambulatory Visit: Payer: Medicare Other | Admitting: Lab

## 2012-02-08 ENCOUNTER — Other Ambulatory Visit: Payer: Medicare Other | Admitting: Lab

## 2012-02-08 ENCOUNTER — Ambulatory Visit (HOSPITAL_BASED_OUTPATIENT_CLINIC_OR_DEPARTMENT_OTHER): Payer: Medicare Other | Admitting: Pharmacist

## 2012-02-08 DIAGNOSIS — I2699 Other pulmonary embolism without acute cor pulmonale: Secondary | ICD-10-CM

## 2012-02-08 LAB — PROTIME-INR: INR: 2.4 (ref 2.00–3.50)

## 2012-02-08 NOTE — Patient Instructions (Signed)
Continue same dose. 5mg  daily except 7.5mg  on Mondays and Fridays. Recheck INR with next Avastin treatment on 02/22/12

## 2012-02-08 NOTE — Progress Notes (Signed)
Continue same dose. 5mg daily except 7.5mg on Mondays and Fridays. Recheck INR with next Avastin treatment on 02/22/12 

## 2012-02-19 ENCOUNTER — Other Ambulatory Visit: Payer: Self-pay | Admitting: Oncology

## 2012-02-22 ENCOUNTER — Other Ambulatory Visit (HOSPITAL_BASED_OUTPATIENT_CLINIC_OR_DEPARTMENT_OTHER): Payer: Medicare Other | Admitting: Lab

## 2012-02-22 ENCOUNTER — Ambulatory Visit (HOSPITAL_BASED_OUTPATIENT_CLINIC_OR_DEPARTMENT_OTHER): Payer: Medicare Other

## 2012-02-22 ENCOUNTER — Ambulatory Visit: Payer: Medicare Other | Admitting: Pharmacist

## 2012-02-22 ENCOUNTER — Other Ambulatory Visit: Payer: Medicare Other | Admitting: Lab

## 2012-02-22 VITALS — BP 119/82 | HR 64 | Temp 97.1°F

## 2012-02-22 DIAGNOSIS — C712 Malignant neoplasm of temporal lobe: Secondary | ICD-10-CM

## 2012-02-22 DIAGNOSIS — I2699 Other pulmonary embolism without acute cor pulmonale: Secondary | ICD-10-CM

## 2012-02-22 DIAGNOSIS — Z5112 Encounter for antineoplastic immunotherapy: Secondary | ICD-10-CM

## 2012-02-22 DIAGNOSIS — C719 Malignant neoplasm of brain, unspecified: Secondary | ICD-10-CM

## 2012-02-22 LAB — CBC WITH DIFFERENTIAL/PLATELET
BASO%: 0.3 % (ref 0.0–2.0)
Basophils Absolute: 0 10*3/uL (ref 0.0–0.1)
EOS%: 1.7 % (ref 0.0–7.0)
HCT: 37.4 % (ref 34.8–46.6)
HGB: 12.9 g/dL (ref 11.6–15.9)
LYMPH%: 41.6 % (ref 14.0–49.7)
MCH: 30.9 pg (ref 25.1–34.0)
MCHC: 34.5 g/dL (ref 31.5–36.0)
MCV: 89.7 fL (ref 79.5–101.0)
MONO%: 10 % (ref 0.0–14.0)
NEUT%: 46.4 % (ref 38.4–76.8)

## 2012-02-22 MED ORDER — SODIUM CHLORIDE 0.9 % IV SOLN
15.0000 mg/kg | INTRAVENOUS | Status: DC
  Administered 2012-02-22: 925 mg via INTRAVENOUS
  Filled 2012-02-22: qty 37

## 2012-02-22 NOTE — Progress Notes (Signed)
INR therapeutic (2.5).  No changes.  No problems with bleeding, bruising.  Continue current dose of 5mg  daily except 7.5mg  on MF.  Recheck INR with next lab/MD/infusion appt on 03/21/12.

## 2012-02-24 ENCOUNTER — Other Ambulatory Visit: Payer: Self-pay | Admitting: Oncology

## 2012-03-21 ENCOUNTER — Ambulatory Visit (HOSPITAL_BASED_OUTPATIENT_CLINIC_OR_DEPARTMENT_OTHER): Payer: Medicare Other

## 2012-03-21 ENCOUNTER — Ambulatory Visit: Payer: Medicare Other | Admitting: Pharmacist

## 2012-03-21 ENCOUNTER — Telehealth: Payer: Self-pay | Admitting: Oncology

## 2012-03-21 ENCOUNTER — Ambulatory Visit (HOSPITAL_BASED_OUTPATIENT_CLINIC_OR_DEPARTMENT_OTHER): Payer: Medicare Other | Admitting: Oncology

## 2012-03-21 ENCOUNTER — Other Ambulatory Visit (HOSPITAL_BASED_OUTPATIENT_CLINIC_OR_DEPARTMENT_OTHER): Payer: Medicare Other | Admitting: Lab

## 2012-03-21 VITALS — BP 113/82 | HR 72 | Temp 96.8°F

## 2012-03-21 VITALS — BP 131/93 | HR 74 | Temp 97.2°F | Ht 63.0 in | Wt 138.0 lb

## 2012-03-21 DIAGNOSIS — C712 Malignant neoplasm of temporal lobe: Secondary | ICD-10-CM

## 2012-03-21 DIAGNOSIS — C719 Malignant neoplasm of brain, unspecified: Secondary | ICD-10-CM

## 2012-03-21 DIAGNOSIS — Z5112 Encounter for antineoplastic immunotherapy: Secondary | ICD-10-CM

## 2012-03-21 DIAGNOSIS — I2699 Other pulmonary embolism without acute cor pulmonale: Secondary | ICD-10-CM

## 2012-03-21 LAB — CBC WITH DIFFERENTIAL/PLATELET
Basophils Absolute: 0 10*3/uL (ref 0.0–0.1)
Eosinophils Absolute: 0.1 10*3/uL (ref 0.0–0.5)
HCT: 40.6 % (ref 34.8–46.6)
HGB: 14.1 g/dL (ref 11.6–15.9)
LYMPH%: 42.5 % (ref 14.0–49.7)
MONO#: 0.5 10*3/uL (ref 0.1–0.9)
NEUT#: 2.7 10*3/uL (ref 1.5–6.5)
NEUT%: 46.8 % (ref 38.4–76.8)
Platelets: 258 10*3/uL (ref 145–400)
RBC: 4.5 10*6/uL (ref 3.70–5.45)
WBC: 5.8 10*3/uL (ref 3.9–10.3)
nRBC: 0 % (ref 0–0)

## 2012-03-21 LAB — COMPREHENSIVE METABOLIC PANEL
ALT: 18 U/L (ref 0–35)
CO2: 24 mEq/L (ref 19–32)
Calcium: 9.9 mg/dL (ref 8.4–10.5)
Chloride: 97 mEq/L (ref 96–112)
Creatinine, Ser: 0.55 mg/dL (ref 0.50–1.10)
Glucose, Bld: 95 mg/dL (ref 70–99)

## 2012-03-21 LAB — LACTATE DEHYDROGENASE: LDH: 262 U/L — ABNORMAL HIGH (ref 94–250)

## 2012-03-21 LAB — UA PROTEIN, DIPSTICK - CHCC: Protein, ur: NEGATIVE mg/dL

## 2012-03-21 LAB — PROTIME-INR: INR: 3.2 (ref 2.00–3.50)

## 2012-03-21 MED ORDER — SODIUM CHLORIDE 0.9 % IV SOLN
Freq: Once | INTRAVENOUS | Status: AC
  Administered 2012-03-21: 12:00:00 via INTRAVENOUS

## 2012-03-21 MED ORDER — SODIUM CHLORIDE 0.9 % IV SOLN
15.0000 mg/kg | INTRAVENOUS | Status: DC
  Administered 2012-03-21: 925 mg via INTRAVENOUS
  Filled 2012-03-21: qty 37

## 2012-03-21 NOTE — Patient Instructions (Signed)
Continue 5mg  daily except 7.5mg  on Mondays & Fridays. Recheck INR in 4 weeks with next treatment. The pharmacist will see you in the infusion area.

## 2012-03-21 NOTE — Telephone Encounter (Signed)
Gv pt appt for [id number] 

## 2012-03-21 NOTE — Patient Instructions (Signed)
[facility] Discharge Instructions for Patients Receiving Chemotherapy  Today you received the following chemotherapy agents Avastin  To help prevent nausea and vomiting after your treatment, we encourage you to take your nausea medication  Begin taking it at 7 pm and take it as often as prescribed for the next 24 to 72 hours.   If you develop nausea and vomiting that is not controlled by your nausea medication, call the clinic. If it is after clinic hours your family physician or the after hours number for the clinic or go to the Emergency Department.   BELOW ARE SYMPTOMS THAT SHOULD BE REPORTED IMMEDIATELY:  *FEVER GREATER THAN 100.5 F  *CHILLS WITH OR WITHOUT FEVER  NAUSEA AND VOMITING THAT IS NOT CONTROLLED WITH YOUR NAUSEA MEDICATION  *UNUSUAL SHORTNESS OF BREATH  *UNUSUAL BRUISING OR BLEEDING  TENDERNESS IN MOUTH AND THROAT WITH OR WITHOUT PRESENCE OF ULCERS  *URINARY PROBLEMS  *BOWEL PROBLEMS  UNUSUAL RASH Items with * indicate a potential emergency and should be followed up as soon as possible.  One of the nurses will contact you 24 hours after your treatment. Please let the nurse know about any problems that you may have experienced. Feel free to call the clinic you have any questions or concerns. The clinic phone number is [phone].   I have been informed and understand all the instructions given to me. I know to contact the clinic, my physician, or go to the Emergency Department if any problems should occur. I do not have any questions at this time, but understand that I may call the clinic during office hours   should I have any questions or need assistance in obtaining follow up care.    __________________________________________  _____________  __________ Signature of Patient or Authorized Representative            Date                   Time    __________________________________________ Nurse's Signature    

## 2012-03-21 NOTE — Progress Notes (Signed)
Hematology and Oncology Follow Up Visit  Mercedes Barnes [id number] 22-Mar-1961 51 y.o. 03/21/2012 11:30 AM   Principle Diagnosis: Encounter Diagnoses  Name Primary?  . Brain cancer Yes  . 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 She underwent debulking surgery. She was initially treated on a clinical trial of Temodar, irinotecan and Avastin. Course was complicated by development of bilateral pulmonary emboli and diffuse bilateral interstitial pulmonary infiltrates and suspected Pneumocystis pneumonia in April 2010. She was subsequently treated off protocol with cranial radiation with concomitant low-dose oral Temodar 07/21 through 07/16/2009. Subsequent maintenance Temodar 200 mg per meter squared, 5 days each month. There was early progression while on Temodar in December 2010. Avastin was added back at that time with disease stabilization. She completed 12 cycles of adjuvant Temodar plus Avastin 08/11/2010. She is currently on maintenance Avastin 15 mg/kg every 4 weeks. MRI of the brain at [facility] on 02/25/2012 showed a stable postoperative appearance of the brain. There were no new areas of enhancement. There was re-demonstration of a cystic lesion within the right basal ganglia with an area of amorphous enhancement just inferior to the cystic lesion within the right medial temporal lobe that was unchanged.   clinically she continues to do well. Current minor sinusitis. She denies any headache, change in vision, slurred speech, ataxia, focal weakness, or any seizure activity.    Medications: reviewed  Allergies:  Allergies  Allergen Reactions  . Co-Trimoxazole Injection (Sulfamethoxazole-Trimethoprim) Hives  . Pentamidine     Hypotension & weakness    Review of Systems: Constitutional:   No constitutional  symptoms Respiratory: No cough or dyspnea Cardiovascular:  No chest pain or palpitations Gastrointestinal: no abdominal pain Genito-Urinary: No urinary tract symptoms Musculoskeletal: No bone pain Neurologic: See above Skin: No rash Remaining ROS negative.  Physical Exam: Blood pressure 131/93, pulse 74, temperature 97.2 F (36.2 C), temperature source Oral, height 5\' 3"  (1.6 m), weight 138 lb (62.596 kg). Wt Readings from Last 3 Encounters:  03/21/12 138 lb (62.596 kg)  01/25/12 138 lb 3.2 oz (62.687 kg)  11/30/11 141 lb (63.957 kg)     General appearance: Thin well-nourished Caucasian woman [name]: Pharynx no erythema or exudate. Tongue midline. Palate elevates symmetrically. Lymph nodes: No adenopathy Breasts: Not examined Lungs: Clear to auscultation resonant to percussion Heart: Regular rhythm no murmur Abdomen: Soft nontender no mass no organomegaly Extremities: No edema no calf tenderness Vascular: No cyanosis Neurologic: Mental status intact, cranial nerves intact, pupils equal round reactive to light, optic disc sharp, vessels normal, no hemorrhage or exudate. Motor strength 5 over 5. Reflexes 1+ symmetric. Upper body coordination with minimal clumsiness on rapid alternating movements of the left hand which has improved over time. Gait is normal. Skin: No rash or ecchymosis  Lab Results: Lab Results  Component Value Date   WBC 5.8 03/21/2012   HGB 14.1 03/21/2012   HCT 40.6 03/21/2012   MCV 90.2 03/21/2012   PLT 258 03/21/2012     Chemistry      Component Value Date/Time   NA 136 01/25/2012 0954   NA 138 03/20/2009 1454   K 4.3 01/25/2012 0954   K 3.8 03/20/2009 1454   CL 104 01/25/2012 0954   CL 100 03/20/2009 1454   CO2 22 01/25/2012 0954   CO2 27 03/20/2009 1454  BUN 12 01/25/2012 0954   BUN 18 03/20/2009 1454   CREATININE 0.49* 01/25/2012 0954   CREATININE 0.6 03/20/2009 1454      Component Value Date/Time   CALCIUM 9.0 01/25/2012 0954   CALCIUM 8.6 03/20/2009 1454   ALKPHOS  55 01/25/2012 0954   ALKPHOS 65 03/20/2009 1454   AST 23 01/25/2012 0954   AST 14 03/20/2009 1454   ALT 17 01/25/2012 0954   BILITOT 0.3 01/25/2012 0954   BILITOT 0.50 03/20/2009 1454       Radiological Studies: See discussion above. 2 month interval MRI brain stable changes Done 02/25/12   Impression and Plan: 1. Cystic glioblastoma multiforme with previous and current treatment as outlined above. Now out a remarkable 3 years from diagnosis. Most recent MRI of the brain 02/25/12 is  stable. 2. Bilateral pulmonary emboli occurring in July 2010. The INR is therapeutic. She will continue Coumadin at the current dose 5 mg daily.. She is followed through the Coumadin Clinic in our office. 3. History of bilateral interstitial pneumonia occurring around the time of initial diagnosis of GBM. She was treated empirically for Pneumocystis. 4. Disposition.to continue Avastin on a q.4 week  Schedule.     CC:. Dr. Ruben Reason; Jeneen Rinks kinder; Winfield Cunas.; [facility] Dr. Quincy Sheehan, MD 5/6/201311:30 AM

## 2012-03-21 NOTE — Progress Notes (Signed)
Continue 5mg daily except 7.5mg on Mondays & Fridays. Recheck INR in 4 weeks with next treatment. The pharmacist will see you in the infusion area. 

## 2012-03-31 ENCOUNTER — Other Ambulatory Visit: Payer: Self-pay

## 2012-03-31 DIAGNOSIS — I10 Essential (primary) hypertension: Secondary | ICD-10-CM

## 2012-03-31 MED ORDER — TRIAMTERENE-HCTZ 37.5-25 MG PO TABS: 1.0000 | ORAL_TABLET | Freq: Every day | ORAL | Status: DC

## 2012-03-31 MED ORDER — AMLODIPINE BESYLATE 5 MG PO TABS: 5.0000 mg | ORAL_TABLET | Freq: Every day | ORAL | Status: DC

## 2012-04-18 ENCOUNTER — Other Ambulatory Visit (HOSPITAL_BASED_OUTPATIENT_CLINIC_OR_DEPARTMENT_OTHER): Payer: Medicare Other | Admitting: Lab

## 2012-04-18 ENCOUNTER — Ambulatory Visit (HOSPITAL_BASED_OUTPATIENT_CLINIC_OR_DEPARTMENT_OTHER): Payer: Medicare Other

## 2012-04-18 ENCOUNTER — Ambulatory Visit: Payer: Medicare Other | Admitting: Pharmacist

## 2012-04-18 VITALS — BP 131/85 | HR 73 | Temp 97.7°F

## 2012-04-18 DIAGNOSIS — Z7901 Long term (current) use of anticoagulants: Secondary | ICD-10-CM

## 2012-04-18 DIAGNOSIS — C719 Malignant neoplasm of brain, unspecified: Secondary | ICD-10-CM

## 2012-04-18 DIAGNOSIS — C712 Malignant neoplasm of temporal lobe: Secondary | ICD-10-CM

## 2012-04-18 DIAGNOSIS — Z5112 Encounter for antineoplastic immunotherapy: Secondary | ICD-10-CM

## 2012-04-18 DIAGNOSIS — Z5111 Encounter for antineoplastic chemotherapy: Secondary | ICD-10-CM

## 2012-04-18 DIAGNOSIS — I2699 Other pulmonary embolism without acute cor pulmonale: Secondary | ICD-10-CM

## 2012-04-18 DIAGNOSIS — Z5181 Encounter for therapeutic drug level monitoring: Secondary | ICD-10-CM

## 2012-04-18 DIAGNOSIS — Z86718 Personal history of other venous thrombosis and embolism: Secondary | ICD-10-CM

## 2012-04-18 LAB — CBC WITH DIFFERENTIAL/PLATELET
BASO%: 0.2 % (ref 0.0–2.0)
EOS%: 0.8 % (ref 0.0–7.0)
LYMPH%: 30.8 % (ref 14.0–49.7)
MCH: 31.5 pg (ref 25.1–34.0)
MCHC: 35 g/dL (ref 31.5–36.0)
MONO#: 0.8 10*3/uL (ref 0.1–0.9)
MONO%: 12.9 % (ref 0.0–14.0)
Platelets: 248 10*3/uL (ref 145–400)
RBC: 4.22 10*6/uL (ref 3.70–5.45)
WBC: 6 10*3/uL (ref 3.9–10.3)

## 2012-04-18 LAB — POCT INR: INR: 2.8

## 2012-04-18 LAB — UA PROTEIN, DIPSTICK - CHCC: Protein, ur: NEGATIVE mg/dL

## 2012-04-18 MED ORDER — SODIUM CHLORIDE 0.9 % IV SOLN
15.0000 mg/kg | INTRAVENOUS | Status: DC
  Administered 2012-04-18: 925 mg via INTRAVENOUS
  Filled 2012-04-18: qty 37

## 2012-04-18 MED ORDER — SODIUM CHLORIDE 0.9 % IV SOLN: Freq: Once | INTRAVENOUS | Status: DC

## 2012-04-18 NOTE — Patient Instructions (Signed)
[facility] Discharge Instructions for Patients Receiving Chemotherapy  Today you received the following chemotherapy agents Avastin To help prevent nausea and vomiting after your treatment, we encourage you to take your nausea medication as prescribed.  If you develop nausea and vomiting that is not controlled by your nausea medication, call the clinic. If it is after clinic hours your family physician or the after hours number for the clinic or go to the Emergency Department.   BELOW ARE SYMPTOMS THAT SHOULD BE REPORTED IMMEDIATELY:  *FEVER GREATER THAN 100.5 F  *CHILLS WITH OR WITHOUT FEVER  NAUSEA AND VOMITING THAT IS NOT CONTROLLED WITH YOUR NAUSEA MEDICATION  *UNUSUAL SHORTNESS OF BREATH  *UNUSUAL BRUISING OR BLEEDING  TENDERNESS IN MOUTH AND THROAT WITH OR WITHOUT PRESENCE OF ULCERS  *URINARY PROBLEMS  *BOWEL PROBLEMS  UNUSUAL RASH Items with * indicate a potential emergency and should be followed up as soon as possible.  One of the nurses will contact you 24 hours after your treatment. Please let the nurse know about any problems that you may have experienced. Feel free to call the clinic you have any questions or concerns. The clinic phone number is [phone].   I have been informed and understand all the instructions given to me. I know to contact the clinic, my physician, or go to the Emergency Department if any problems should occur. I do not have any questions at this time, but understand that I may call the clinic during office hours   should I have any questions or need assistance in obtaining follow up care.    __________________________________________  _____________  __________ Signature of Patient or Authorized Representative            Date                   Time    __________________________________________ Nurse's Signature    

## 2012-04-18 NOTE — Progress Notes (Signed)
INR = 2.8 on 5 mg/day; 7.5 mg M/F. Doing fine.  No issues to report. Cont same dose. Repeat protime in 1 month.  We'll see pt during chemo on 05/16/12 Marily Lente, Pharm.D.

## 2012-05-04 ENCOUNTER — Other Ambulatory Visit: Payer: Self-pay | Admitting: Pharmacist

## 2012-05-04 DIAGNOSIS — I2699 Other pulmonary embolism without acute cor pulmonale: Secondary | ICD-10-CM

## 2012-05-16 ENCOUNTER — Ambulatory Visit: Payer: Medicare Other | Admitting: Pharmacist

## 2012-05-16 ENCOUNTER — Ambulatory Visit (HOSPITAL_BASED_OUTPATIENT_CLINIC_OR_DEPARTMENT_OTHER): Payer: Medicare Other

## 2012-05-16 ENCOUNTER — Other Ambulatory Visit: Payer: Medicare Other

## 2012-05-16 VITALS — BP 116/74 | HR 68 | Temp 97.0°F

## 2012-05-16 DIAGNOSIS — I2699 Other pulmonary embolism without acute cor pulmonale: Secondary | ICD-10-CM

## 2012-05-16 DIAGNOSIS — C719 Malignant neoplasm of brain, unspecified: Secondary | ICD-10-CM

## 2012-05-16 DIAGNOSIS — Z5112 Encounter for antineoplastic immunotherapy: Secondary | ICD-10-CM

## 2012-05-16 DIAGNOSIS — C712 Malignant neoplasm of temporal lobe: Secondary | ICD-10-CM

## 2012-05-16 LAB — CBC WITH DIFFERENTIAL/PLATELET
Eosinophils Absolute: 0.1 10*3/uL (ref 0.0–0.5)
MONO#: 0.6 10*3/uL (ref 0.1–0.9)
NEUT#: 3.4 10*3/uL (ref 1.5–6.5)
RBC: 4.28 10*6/uL (ref 3.70–5.45)
RDW: 13.6 % (ref 11.2–14.5)
WBC: 6.4 10*3/uL (ref 3.9–10.3)
lymph#: 2.4 10*3/uL (ref 0.9–3.3)
nRBC: 0 % (ref 0–0)

## 2012-05-16 LAB — COMPREHENSIVE METABOLIC PANEL
ALT: 21 U/L (ref 0–35)
Albumin: 4.3 g/dL (ref 3.5–5.2)
CO2: 25 mEq/L (ref 19–32)
Calcium: 9.9 mg/dL (ref 8.4–10.5)
Chloride: 96 mEq/L (ref 96–112)
Potassium: 4.1 mEq/L (ref 3.5–5.3)
Sodium: 131 mEq/L — ABNORMAL LOW (ref 135–145)
Total Bilirubin: 0.3 mg/dL (ref 0.3–1.2)
Total Protein: 6.7 g/dL (ref 6.0–8.3)

## 2012-05-16 LAB — TECHNOLOGIST REVIEW

## 2012-05-16 LAB — PROTIME-INR

## 2012-05-16 MED ORDER — SODIUM CHLORIDE 0.9 % IV SOLN
Freq: Once | INTRAVENOUS | Status: AC
  Administered 2012-05-16: 16:00:00 via INTRAVENOUS

## 2012-05-16 MED ORDER — SODIUM CHLORIDE 0.9 % IV SOLN
15.0000 mg/kg | INTRAVENOUS | Status: DC
  Administered 2012-05-16: 925 mg via INTRAVENOUS
  Filled 2012-05-16: qty 37

## 2012-05-16 NOTE — Progress Notes (Signed)
Pt has no changes to report.  She is taking 5 mg daily with 7.5 mg on Mon and Fri. We will recheck her in one month with her next infusion on 06/13/12.

## 2012-05-16 NOTE — Patient Instructions (Signed)
Pt has no changes to report.  She is taking 5 mg daily with 7.5 mg on Mon and Fri. We will recheck her in one month with her next infusion on 06/13/12. 

## 2012-05-16 NOTE — Patient Instructions (Signed)
[facility] Discharge Instructions for Patients Receiving Chemotherapy  Today you received the following chemotherapy agents Avastin. To help prevent nausea and vomiting after your treatment, we encourage you to take your nausea medication Dr. Cyndie Chime.  If you develop nausea and vomiting that is not controlled by your nausea medication, call the clinic. If it is after clinic hours your family physician or the after hours number for the clinic or go to the Emergency Department.   BELOW ARE SYMPTOMS THAT SHOULD BE REPORTED IMMEDIATELY:  *FEVER GREATER THAN 100.5 F  *CHILLS WITH OR WITHOUT FEVER  NAUSEA AND VOMITING THAT IS NOT CONTROLLED WITH YOUR NAUSEA MEDICATION  *UNUSUAL SHORTNESS OF BREATH  *UNUSUAL BRUISING OR BLEEDING  TENDERNESS IN MOUTH AND THROAT WITH OR WITHOUT PRESENCE OF ULCERS  *URINARY PROBLEMS  *BOWEL PROBLEMS  UNUSUAL RASH Items with * indicate a potential emergency and should be followed up as soon as possible. Feel free to call the clinic you have any questions or concerns. The clinic phone number is [phone].   I have been informed and understand all the instructions given to me. I know to contact the clinic, my physician, or go to the Emergency Department if any problems should occur. I do not have any questions at this time, but understand that I may call the clinic during office hours   should I have any questions or need assistance in obtaining follow up care.    __________________________________________  _____________  __________ Signature of Patient or Authorized Representative            Date                   Time    __________________________________________ Nurse's Signature

## 2012-06-13 ENCOUNTER — Ambulatory Visit: Payer: Medicare Other

## 2012-06-13 ENCOUNTER — Other Ambulatory Visit: Payer: Medicare Other | Admitting: Lab

## 2012-06-13 ENCOUNTER — Other Ambulatory Visit: Payer: Self-pay | Admitting: Nurse Practitioner

## 2012-06-13 ENCOUNTER — Ambulatory Visit: Payer: Medicare Other | Admitting: Pharmacist

## 2012-06-13 ENCOUNTER — Ambulatory Visit (HOSPITAL_BASED_OUTPATIENT_CLINIC_OR_DEPARTMENT_OTHER): Payer: Medicare Other

## 2012-06-13 ENCOUNTER — Other Ambulatory Visit (HOSPITAL_BASED_OUTPATIENT_CLINIC_OR_DEPARTMENT_OTHER): Payer: Medicare Other | Admitting: Lab

## 2012-06-13 ENCOUNTER — Telehealth: Payer: Self-pay | Admitting: Oncology

## 2012-06-13 ENCOUNTER — Ambulatory Visit (HOSPITAL_BASED_OUTPATIENT_CLINIC_OR_DEPARTMENT_OTHER): Payer: Medicare Other | Admitting: Nurse Practitioner

## 2012-06-13 VITALS — BP 132/95 | HR 67 | Temp 96.8°F | Ht 63.0 in | Wt 140.8 lb

## 2012-06-13 VITALS — BP 139/90

## 2012-06-13 DIAGNOSIS — Z7901 Long term (current) use of anticoagulants: Secondary | ICD-10-CM

## 2012-06-13 DIAGNOSIS — C719 Malignant neoplasm of brain, unspecified: Secondary | ICD-10-CM

## 2012-06-13 DIAGNOSIS — Z5112 Encounter for antineoplastic immunotherapy: Secondary | ICD-10-CM

## 2012-06-13 DIAGNOSIS — I2699 Other pulmonary embolism without acute cor pulmonale: Secondary | ICD-10-CM

## 2012-06-13 DIAGNOSIS — C712 Malignant neoplasm of temporal lobe: Secondary | ICD-10-CM

## 2012-06-13 DIAGNOSIS — Z5181 Encounter for therapeutic drug level monitoring: Secondary | ICD-10-CM

## 2012-06-13 LAB — CBC WITH DIFFERENTIAL/PLATELET
EOS%: 2.2 % (ref 0.0–7.0)
MCH: 32.2 pg (ref 25.1–34.0)
MCHC: 34.8 g/dL (ref 31.5–36.0)
MCV: 92.5 fL (ref 79.5–101.0)
MONO%: 11.9 % (ref 0.0–14.0)
RBC: 4.39 10*6/uL (ref 3.70–5.45)
RDW: 14 % (ref 11.2–14.5)

## 2012-06-13 LAB — COMPREHENSIVE METABOLIC PANEL
AST: 22 U/L (ref 0–37)
Albumin: 4.5 g/dL (ref 3.5–5.2)
Alkaline Phosphatase: 56 U/L (ref 39–117)
Potassium: 4 mEq/L (ref 3.5–5.3)
Sodium: 133 mEq/L — ABNORMAL LOW (ref 135–145)
Total Protein: 7.4 g/dL (ref 6.0–8.3)

## 2012-06-13 LAB — PROTIME-INR

## 2012-06-13 MED ORDER — BEVACIZUMAB CHEMO INJECTION 400 MG/16ML
15.0000 mg/kg | INTRAVENOUS | Status: DC
  Administered 2012-06-13: 925 mg via INTRAVENOUS
  Filled 2012-06-13: qty 37

## 2012-06-13 MED ORDER — SODIUM CHLORIDE 0.9 % IV SOLN
Freq: Once | INTRAVENOUS | Status: AC
  Administered 2012-06-13: 12:00:00 via INTRAVENOUS

## 2012-06-13 NOTE — Progress Notes (Signed)
Pt seen together with husband in the infusion area. INR therapeutic (2.6) today.   No c/o of bleeding/bruising, or s/s clotting.  No missed doses. No changes in meds or diet.   Continue current dose of 5mg  daily except 7.5mg  on MF.   Recheck INR in 4 weeks with next lab/infusion appt on 07/11/12.  Pharmacist will meet with pt again in the infusion area.

## 2012-06-13 NOTE — Progress Notes (Signed)
OFFICE PROGRESS NOTE  Interval history:  Mercedes Barnes returns as scheduled. She continues monthly Avastin. She overall feels well. Aside from some minor sinus symptoms, no interim illnesses or infections. She denies bleeding. No unusual headaches. No visual disturbance. No falls or balance problems. No shortness of breath or chest pain. No leg swelling or calf pain.  Objective: Blood pressure 132/95, pulse 67, temperature 96.8 F (36 C), temperature source Oral, height 5\' 3"  (1.6 m), weight 140 lb 12.8 oz (63.866 kg).  Pupils equal round and reactive to light. Extraocular movements intact. Oropharynx without thrush or ulceration. Lungs clear. No wheezes or rales. Regular cardiac rhythm. Abdomen soft and nontender. No organomegaly. Extremities without edema. Calves soft and nontender. Motor strength 5 over 5. Knee DTRs 2+, symmetric. Slight clumsiness with rapid alternating movement of the left hand.  Lab Results: Lab Results  Component Value Date   WBC 6.4 05/16/2012   HGB 13.2 05/16/2012   HCT 37.9 05/16/2012   MCV 88.6 05/16/2012   PLT 266 05/16/2012    Chemistry:    Chemistry      Component Value Date/Time   NA 131* 05/16/2012 1428   NA 138 03/20/2009 1454   K 4.1 05/16/2012 1428   K 3.8 03/20/2009 1454   CL 96 05/16/2012 1428   CL 100 03/20/2009 1454   CO2 25 05/16/2012 1428   CO2 27 03/20/2009 1454   BUN 12 05/16/2012 1428   BUN 18 03/20/2009 1454   CREATININE 0.44* 05/16/2012 1428   CREATININE 0.6 03/20/2009 1454      Component Value Date/Time   CALCIUM 9.9 05/16/2012 1428   CALCIUM 8.6 03/20/2009 1454   ALKPHOS 57 05/16/2012 1428   ALKPHOS 65 03/20/2009 1454   AST 22 05/16/2012 1428   AST 14 03/20/2009 1454   ALT 21 05/16/2012 1428   BILITOT 0.3 05/16/2012 1428   BILITOT 0.50 03/20/2009 1454       Studies/Results: No results found.  Medications: I have reviewed the patient's current medications.  Assessment/Plan:  1. Cystic glioblastoma multiforme-she was diagnosed with a cystic high-grade GBM of the  right frontal and parietal lobe of the brain with extension to the basal ganglia April 2010. She underwent debulking surgery. She was initially treated on a clinical trial with Temodar, irinotecan and Avastin. Course was complicated by development of bilateral pulmonary emboli and diffuse bilateral interstitial pulmonary infiltrates and suspected pneumocystis pneumonia April 2010. She was subsequently treated off protocol with cranial radiation with concomitant low-dose oral Temodar 07/21 through 07/16/2009. Subsequent maintenance Temodar 200 mg per meter squared 5 days each month. There was early progression while on Temodar December 2010. Avastin was added back at that time with disease stabilization. She completed 12 cycles of adjuvant Temodar plus Avastin 08/11/2010. She is currently on maintenance Avastin 15 mg per kilogram every 4 weeks. MRI [facility] at [facility] on 05/26/2012 with no evidence of disease progression. 2. Bilateral pulmonary emboli occurring in July 2010. She is maintained on Coumadin. The Coumadin is followed through the Coumadin clinic in our office. 3. History of bilateral interstitial pneumonia occurring around the time of initial diagnosis of GBM. She was treated empirically for Pneumocystis. 4. Disposition. Mercedes Barnes appears stable. Plan to continue Avastin on a q.4 week schedule. She will return for a followup visit in 12 weeks. She will contact the office in the interim with any problems. She continues followup at [facility].  Plan reviewed with Dr. Beryle Beams.  Ned Card ANP/GNP-BC

## 2012-06-13 NOTE — Progress Notes (Signed)
Urine dipstick protein (+), 30.

## 2012-06-13 NOTE — Patient Instructions (Signed)
[facility] Discharge Instructions for Patients Receiving Chemotherapy  Today you received the following chemotherapy agents Avastin.  To help prevent nausea and vomiting after your treatment, we encourage you to take your nausea medication.  If you develop nausea and vomiting that is not controlled by your nausea medication, call the clinic. If it is after clinic hours your family physician or the after hours number for the clinic or go to the Emergency Department.   BELOW ARE SYMPTOMS THAT SHOULD BE REPORTED IMMEDIATELY:  *FEVER GREATER THAN 100.5 F  *CHILLS WITH OR WITHOUT FEVER  NAUSEA AND VOMITING THAT IS NOT CONTROLLED WITH YOUR NAUSEA MEDICATION  *UNUSUAL SHORTNESS OF BREATH  *UNUSUAL BRUISING OR BLEEDING  TENDERNESS IN MOUTH AND THROAT WITH OR WITHOUT PRESENCE OF ULCERS  *URINARY PROBLEMS  *BOWEL PROBLEMS  UNUSUAL RASH Items with * indicate a potential emergency and should be followed up as soon as possible.  One of the nurses will contact you 24 hours after your treatment. Please let the nurse know about any problems that you may have experienced. Feel free to call the clinic you have any questions or concerns. The clinic phone number is [phone].   I have been informed and understand all the instructions given to me. I know to contact the clinic, my physician, or go to the Emergency Department if any problems should occur. I do not have any questions at this time, but understand that I may call the clinic during office hours   should I have any questions or need assistance in obtaining follow up care.    __________________________________________  _____________  __________ Signature of Patient or Authorized Representative            Date                   Time    __________________________________________ Nurse's Signature    

## 2012-06-13 NOTE — Telephone Encounter (Signed)
gv husband appt schedule for August thru October.

## 2012-07-01 ENCOUNTER — Ambulatory Visit: Payer: Medicare Other | Admitting: Hematology and Oncology

## 2012-07-08 ENCOUNTER — Other Ambulatory Visit: Payer: Self-pay | Admitting: Oncology

## 2012-07-11 ENCOUNTER — Ambulatory Visit (HOSPITAL_BASED_OUTPATIENT_CLINIC_OR_DEPARTMENT_OTHER): Payer: Medicare Other

## 2012-07-11 ENCOUNTER — Ambulatory Visit: Payer: Medicare Other | Admitting: Pharmacist

## 2012-07-11 ENCOUNTER — Other Ambulatory Visit (HOSPITAL_BASED_OUTPATIENT_CLINIC_OR_DEPARTMENT_OTHER): Payer: Medicare Other | Admitting: Lab

## 2012-07-11 VITALS — BP 147/96 | HR 80 | Temp 98.9°F | Resp 20

## 2012-07-11 DIAGNOSIS — Z5112 Encounter for antineoplastic immunotherapy: Secondary | ICD-10-CM

## 2012-07-11 DIAGNOSIS — C719 Malignant neoplasm of brain, unspecified: Secondary | ICD-10-CM

## 2012-07-11 DIAGNOSIS — C712 Malignant neoplasm of temporal lobe: Secondary | ICD-10-CM

## 2012-07-11 DIAGNOSIS — I2699 Other pulmonary embolism without acute cor pulmonale: Secondary | ICD-10-CM

## 2012-07-11 LAB — UA PROTEIN, DIPSTICK - CHCC: Protein, ur: 30 mg/dL

## 2012-07-11 LAB — PROTIME-INR: INR: 4.2 — ABNORMAL HIGH (ref 2.00–3.50)

## 2012-07-11 MED ORDER — SODIUM CHLORIDE 0.9 % IV SOLN
15.0000 mg/kg | INTRAVENOUS | Status: DC
  Administered 2012-07-11: 925 mg via INTRAVENOUS
  Filled 2012-07-11: qty 37

## 2012-07-11 MED ORDER — SODIUM CHLORIDE 0.9 % IV SOLN
Freq: Once | INTRAVENOUS | Status: AC
  Administered 2012-07-11: 15:00:00 via INTRAVENOUS

## 2012-07-11 NOTE — Progress Notes (Signed)
INR supratherapeutic today (4.2) Decreased intake of greens, and had a glass of wine every [place] the [place].  No changes in meds.  No bleeding issues.   Will hold x 2 doses, then resume usual dose of 5mg  daily except 7.5mg  on MF.  Recheck INR in ~10 days.

## 2012-07-11 NOTE — Patient Instructions (Signed)
[facility] Discharge Instructions for Patients Receiving Chemotherapy  Today you received the following chemotherapy agents Avastin To help prevent nausea and vomiting after your treatment, we encourage you to take your nausea medication as directed by MD  If you develop nausea and vomiting that is not controlled by your nausea medication, call the clinic. If it is after clinic hours your family physician or the after hours number for the clinic or go to the Emergency Department.   BELOW ARE SYMPTOMS THAT SHOULD BE REPORTED IMMEDIATELY:  *FEVER GREATER THAN 100.5 F  *CHILLS WITH OR WITHOUT FEVER  NAUSEA AND VOMITING THAT IS NOT CONTROLLED WITH YOUR NAUSEA MEDICATION  *UNUSUAL SHORTNESS OF BREATH  *UNUSUAL BRUISING OR BLEEDING  TENDERNESS IN MOUTH AND THROAT WITH OR WITHOUT PRESENCE OF ULCERS  *URINARY PROBLEMS  *BOWEL PROBLEMS  UNUSUAL RASH Items with * indicate a potential emergency and should be followed up as soon as possible.  One of the nurses will contact you 24 hours after your treatment. Please let the nurse know about any problems that you may have experienced. Feel free to call the clinic you have any questions or concerns. The clinic phone number is [phone].   I have been informed and understand all the instructions given to me. I know to contact the clinic, my physician, or go to the Emergency Department if any problems should occur. I do not have any questions at this time, but understand that I may call the clinic during office hours   should I have any questions or need assistance in obtaining follow up care.    __________________________________________  _____________  __________ Signature of Patient or Authorized Representative            Date                   Time    __________________________________________ Nurse's Signature

## 2012-07-20 ENCOUNTER — Other Ambulatory Visit (HOSPITAL_BASED_OUTPATIENT_CLINIC_OR_DEPARTMENT_OTHER): Payer: Medicare Other | Admitting: Lab

## 2012-07-20 ENCOUNTER — Ambulatory Visit (HOSPITAL_BASED_OUTPATIENT_CLINIC_OR_DEPARTMENT_OTHER): Payer: Medicare Other | Admitting: Pharmacist

## 2012-07-20 DIAGNOSIS — I2699 Other pulmonary embolism without acute cor pulmonale: Secondary | ICD-10-CM

## 2012-07-20 LAB — PROTIME-INR: Protime: 30 Seconds — ABNORMAL HIGH (ref 10.6–13.4)

## 2012-07-20 LAB — POCT INR: INR: 2.5

## 2012-07-20 NOTE — Patient Instructions (Signed)
Contine usual Coumadin dose of 1.5 tablets on Monday & Friday and 1 tablet all other days.  Recheck INR on 08/08/12 with lab at 3:15pm, infusion at 3:45pm and Coumadin Clinic at 4pm.

## 2012-07-20 NOTE — Progress Notes (Signed)
Continue usual dose of 5mg  daily except 7.5mg  on Mon&Fri.  Recheck INR on 08/08/12 with lab at 3:15pm, infusion at 3:45pm and Coumadin Clinic at 4pm.

## 2012-08-08 ENCOUNTER — Other Ambulatory Visit (HOSPITAL_BASED_OUTPATIENT_CLINIC_OR_DEPARTMENT_OTHER): Payer: Medicare Other | Admitting: Lab

## 2012-08-08 ENCOUNTER — Ambulatory Visit (HOSPITAL_BASED_OUTPATIENT_CLINIC_OR_DEPARTMENT_OTHER): Payer: Medicare Other

## 2012-08-08 ENCOUNTER — Ambulatory Visit (HOSPITAL_BASED_OUTPATIENT_CLINIC_OR_DEPARTMENT_OTHER): Payer: Medicare Other | Admitting: Pharmacist

## 2012-08-08 VITALS — BP 128/89 | HR 65 | Temp 97.3°F | Resp 17

## 2012-08-08 DIAGNOSIS — Z5112 Encounter for antineoplastic immunotherapy: Secondary | ICD-10-CM

## 2012-08-08 DIAGNOSIS — I2699 Other pulmonary embolism without acute cor pulmonale: Secondary | ICD-10-CM

## 2012-08-08 DIAGNOSIS — C712 Malignant neoplasm of temporal lobe: Secondary | ICD-10-CM

## 2012-08-08 DIAGNOSIS — C719 Malignant neoplasm of brain, unspecified: Secondary | ICD-10-CM

## 2012-08-08 LAB — CBC WITH DIFFERENTIAL/PLATELET
BASO%: 0.2 % (ref 0.0–2.0)
EOS%: 1.7 % (ref 0.0–7.0)
MCH: 30.9 pg (ref 25.1–34.0)
MCHC: 34.4 g/dL (ref 31.5–36.0)
MCV: 89.9 fL (ref 79.5–101.0)
MONO%: 12.1 % (ref 0.0–14.0)
NEUT%: 51.1 % (ref 38.4–76.8)
RDW: 13.7 % (ref 11.2–14.5)
lymph#: 2.2 10*3/uL (ref 0.9–3.3)

## 2012-08-08 LAB — COMPREHENSIVE METABOLIC PANEL (CC13)
Albumin: 3.5 g/dL (ref 3.5–5.0)
Alkaline Phosphatase: 53 U/L (ref 40–150)
BUN: 13 mg/dL (ref 7.0–26.0)
CO2: 22 mEq/L (ref 22–29)
Glucose: 93 mg/dl (ref 70–99)
Potassium: 4.3 mEq/L (ref 3.5–5.1)
Total Protein: 6.6 g/dL (ref 6.4–8.3)

## 2012-08-08 LAB — POCT INR: INR: 2.2

## 2012-08-08 LAB — PROTIME-INR: Protime: 26.4 Seconds — ABNORMAL HIGH (ref 10.6–13.4)

## 2012-08-08 LAB — UA PROTEIN, DIPSTICK - CHCC: Protein, ur: 30 mg/dL

## 2012-08-08 MED ORDER — SODIUM CHLORIDE 0.9 % IV SOLN
Freq: Once | INTRAVENOUS | Status: AC
  Administered 2012-08-08: 16:00:00 via INTRAVENOUS

## 2012-08-08 MED ORDER — SODIUM CHLORIDE 0.9 % IV SOLN
15.0000 mg/kg | INTRAVENOUS | Status: DC
  Administered 2012-08-08: 925 mg via INTRAVENOUS
  Filled 2012-08-08: qty 37

## 2012-08-08 NOTE — Progress Notes (Signed)
Contine Coumadin 5mg  daily except 7.5mg  on Mondays and Fridays.  Recheck INR on 09/05/12 with lab at 1:30pm, MD apt at 2pm, infusion at 3:00pm and Coumadin Clinic at 3:15pm.

## 2012-08-08 NOTE — Patient Instructions (Signed)
[facility] Discharge Instructions for Patients Receiving Chemotherapy  Today you received the following chemotherapy agent Avastin.  To help prevent nausea and vomiting after your treatment, we encourage you to take your nausea medication. Begin taking your nausea medication as often as prescribed for by Dr. Cyndie Chime.    If you develop nausea and vomiting that is not controlled by your nausea medication, call the clinic. If it is after clinic hours your family physician or the after hours number for the clinic or go to the Emergency Department.   BELOW ARE SYMPTOMS THAT SHOULD BE REPORTED IMMEDIATELY:  *FEVER GREATER THAN 100.5 F  *CHILLS WITH OR WITHOUT FEVER  NAUSEA AND VOMITING THAT IS NOT CONTROLLED WITH YOUR NAUSEA MEDICATION  *UNUSUAL SHORTNESS OF BREATH  *UNUSUAL BRUISING OR BLEEDING  TENDERNESS IN MOUTH AND THROAT WITH OR WITHOUT PRESENCE OF ULCERS  *URINARY PROBLEMS  *BOWEL PROBLEMS  UNUSUAL RASH Items with * indicate a potential emergency and should be followed up as soon as possible.  One of the nurses will contact you 24 hours after your treatment. Please let the nurse know about any problems that you may have experienced. Feel free to call the clinic you have any questions or concerns. The clinic phone number is [phone].   I have been informed and understand all the instructions given to me. I know to contact the clinic, my physician, or go to the Emergency Department if any problems should occur. I do not have any questions at this time, but understand that I may call the clinic during office hours   should I have any questions or need assistance in obtaining follow up care.    __________________________________________  _____________  __________ Signature of Patient or Authorized Representative            Date                   Time    __________________________________________ Nurse's Signature

## 2012-08-08 NOTE — Patient Instructions (Signed)
Contine usual Coumadin dose of 1.5 tablets on Monday & Friday and 1 tablet all other days.  Recheck INR on 09/05/12 with lab at 1:30pm, MD apt at 2pm, infusion at 3:00pm and Coumadin Clinic at 3:15pm.

## 2012-08-09 ENCOUNTER — Telehealth: Payer: Self-pay | Admitting: Oncology

## 2012-08-09 ENCOUNTER — Telehealth: Payer: Self-pay | Admitting: *Deleted

## 2012-08-09 ENCOUNTER — Other Ambulatory Visit: Payer: Self-pay | Admitting: Oncology

## 2012-08-09 DIAGNOSIS — E871 Hypo-osmolality and hyponatremia: Secondary | ICD-10-CM

## 2012-08-09 NOTE — Telephone Encounter (Signed)
Talked to patient gave her appt for 09/05/12 lab, MD and infusion

## 2012-08-09 NOTE — Telephone Encounter (Signed)
Called husband and patient.  Let them both know to sop maxide due to blood Na level low.  Will check at 09/05/12 visit.

## 2012-08-09 NOTE — Telephone Encounter (Signed)
Message copied by Orbie Hurst on Tue Aug 09, 2012  1:39 PM ------      Message from: Levert Feinstein      Created: Tue Aug 09, 2012  9:56 AM       Call pt  Blood sodium level low - she needs to stop maxide  Will repeat at time of 10/21 visit

## 2012-08-17 ENCOUNTER — Telehealth: Payer: Self-pay | Admitting: Pharmacist

## 2012-08-17 NOTE — Telephone Encounter (Signed)
Pt called with concern about stopping the Maxide last week.  Pt says that she noticed today that her left leg seems to be swollen, and her toes started tingling after she finished her exercise bike routine.  The tingling is almost so bad in her left foot that she can barely walk on it. She worries that her symptoms might be related to coming off that water pill, or possibly using her compression stockings for too long.  Advised patient that her unilateral swelling and tingling is not likely r/t coming off Maxide.  It is more likely related to using her compression stocking too long on that leg, or her symptoms could be suspicious for a DVT.  Recommended that the pt go to the ER to have her leg evaluated.  Pt communicated understanding, but she said she felt like it did not warrant a visit to the ER just yet.

## 2012-08-19 ENCOUNTER — Telehealth: Payer: Self-pay | Admitting: Pharmacist

## 2012-08-19 DIAGNOSIS — I2699 Other pulmonary embolism without acute cor pulmonale: Secondary | ICD-10-CM

## 2012-08-19 NOTE — Telephone Encounter (Signed)
Received communication from Dr. Cyndie Chime that he also does not think that the Maxide is responsible for the leg symptoms she is experiencing.  Her INRs have been consistently therapeutic, so he also does not believe that her leg swelling could be related to a clot.  However, to be safe, he recommended that she come in for a repeat INR and D-Dimer.    Scheduled lab and CC appt with patient for Wednesday, 08/24/12.

## 2012-08-24 ENCOUNTER — Ambulatory Visit (HOSPITAL_BASED_OUTPATIENT_CLINIC_OR_DEPARTMENT_OTHER): Payer: Medicare Other | Admitting: Pharmacist

## 2012-08-24 ENCOUNTER — Other Ambulatory Visit (HOSPITAL_BASED_OUTPATIENT_CLINIC_OR_DEPARTMENT_OTHER): Payer: Medicare Other

## 2012-08-24 DIAGNOSIS — I2699 Other pulmonary embolism without acute cor pulmonale: Secondary | ICD-10-CM

## 2012-08-24 DIAGNOSIS — Z7901 Long term (current) use of anticoagulants: Secondary | ICD-10-CM

## 2012-08-24 LAB — PROTIME-INR: Protime: 32.4 Seconds — ABNORMAL HIGH (ref 10.6–13.4)

## 2012-08-24 NOTE — Progress Notes (Signed)
INR today = 2.7 D-Dimer pending.  Pt and husband seen for a follow-up visit today after their call about troublesome swelling and tingling in her left foot.  Her left side has always been troublesome for tingling and weakness since her brain tumor.  The swelling has dramatically decreased over the last week.  Both pt and husband seem relieved to see the improvement.  It is unlikely that the swelling and tingling is related to a clot since her INR remains therapeutic.   Continue current dose of 5mg  daily except 7.5mg  on MF.  Will repeat INR in about a week and a half with scheduled lab, MD, and infusion appts.

## 2012-08-25 LAB — D-DIMER, QUANTITATIVE: D-Dimer, Quant: 0.27 ug/mL-FEU (ref 0.00–0.48)

## 2012-08-29 ENCOUNTER — Telehealth: Payer: Self-pay | Admitting: *Deleted

## 2012-08-29 NOTE — Telephone Encounter (Signed)
Noreene Larsson, NP from [facility] saw pt Mercedes Barnes 08/25/12 stating test showed disease progression.  Also stated that they recommend starting Avastin 10mg /kg to start 09/05/12 every two weeks and Temodar 80mg  daily.  Call back #[phone] or pager #[phone].  Note to Dr. Cyndie Chime

## 2012-08-30 ENCOUNTER — Telehealth: Payer: Self-pay | Admitting: *Deleted

## 2012-08-30 NOTE — Telephone Encounter (Signed)
Called and spoke with pt husband; per Dr. Cyndie Chime d-dimer blood test came back normal and there is no evidence of new clot.  Pt husband verbalized understanding and confirmed appt for 09/05/12.

## 2012-08-30 NOTE — Telephone Encounter (Signed)
Message copied by Gala Romney on Tue Aug 30, 2012  2:29 PM ------      Message from: Levert Feinstein      Created: Thu Aug 25, 2012  9:24 AM       Call pt - d-dimer blood test is normal: no evidence for new clot

## 2012-08-31 ENCOUNTER — Telehealth: Payer: Self-pay | Admitting: Oncology

## 2012-08-31 ENCOUNTER — Other Ambulatory Visit: Payer: Self-pay | Admitting: Oncology

## 2012-08-31 NOTE — Telephone Encounter (Signed)
Per 10/16 change avastin from q4w to q2w starting 10/21. S/w pt re changes and confirmed next appt for 10/21. Pt to get new schedule when she comes in.

## 2012-09-05 ENCOUNTER — Telehealth: Payer: Self-pay | Admitting: Oncology

## 2012-09-05 ENCOUNTER — Ambulatory Visit (HOSPITAL_BASED_OUTPATIENT_CLINIC_OR_DEPARTMENT_OTHER): Payer: Medicare Other

## 2012-09-05 ENCOUNTER — Encounter: Payer: Self-pay | Admitting: Oncology

## 2012-09-05 ENCOUNTER — Ambulatory Visit (HOSPITAL_BASED_OUTPATIENT_CLINIC_OR_DEPARTMENT_OTHER): Payer: Medicare Other | Admitting: Pharmacist

## 2012-09-05 ENCOUNTER — Ambulatory Visit (HOSPITAL_BASED_OUTPATIENT_CLINIC_OR_DEPARTMENT_OTHER): Payer: Medicare Other | Admitting: Oncology

## 2012-09-05 ENCOUNTER — Other Ambulatory Visit (HOSPITAL_BASED_OUTPATIENT_CLINIC_OR_DEPARTMENT_OTHER): Payer: Medicare Other | Admitting: Lab

## 2012-09-05 ENCOUNTER — Other Ambulatory Visit: Payer: Self-pay | Admitting: *Deleted

## 2012-09-05 VITALS — BP 140/94 | HR 78 | Temp 97.2°F | Resp 20 | Ht 63.0 in | Wt 146.6 lb

## 2012-09-05 DIAGNOSIS — Z5112 Encounter for antineoplastic immunotherapy: Secondary | ICD-10-CM

## 2012-09-05 DIAGNOSIS — I2699 Other pulmonary embolism without acute cor pulmonale: Secondary | ICD-10-CM

## 2012-09-05 DIAGNOSIS — C712 Malignant neoplasm of temporal lobe: Secondary | ICD-10-CM

## 2012-09-05 DIAGNOSIS — C719 Malignant neoplasm of brain, unspecified: Secondary | ICD-10-CM

## 2012-09-05 DIAGNOSIS — Z7901 Long term (current) use of anticoagulants: Secondary | ICD-10-CM

## 2012-09-05 DIAGNOSIS — E039 Hypothyroidism, unspecified: Secondary | ICD-10-CM

## 2012-09-05 DIAGNOSIS — Z86711 Personal history of pulmonary embolism: Secondary | ICD-10-CM

## 2012-09-05 DIAGNOSIS — I10 Essential (primary) hypertension: Secondary | ICD-10-CM

## 2012-09-05 DIAGNOSIS — E871 Hypo-osmolality and hyponatremia: Secondary | ICD-10-CM

## 2012-09-05 HISTORY — DX: Hypothyroidism, unspecified: E03.9

## 2012-09-05 HISTORY — DX: Long term (current) use of anticoagulants: Z79.01

## 2012-09-05 LAB — CBC WITH DIFFERENTIAL/PLATELET
Basophils Absolute: 0 10*3/uL (ref 0.0–0.1)
EOS%: 1.4 % (ref 0.0–7.0)
HGB: 13.6 g/dL (ref 11.6–15.9)
LYMPH%: 30.1 % (ref 14.0–49.7)
MCH: 31.1 pg (ref 25.1–34.0)
MCV: 92.2 fL (ref 79.5–101.0)
MONO%: 10.9 % (ref 0.0–14.0)
NEUT%: 57.3 % (ref 38.4–76.8)
Platelets: 301 10*3/uL (ref 145–400)
RDW: 13.9 % (ref 11.2–14.5)

## 2012-09-05 LAB — COMPREHENSIVE METABOLIC PANEL (CC13)
ALT: 24 U/L (ref 0–55)
AST: 19 U/L (ref 5–34)
Albumin: 3.6 g/dL (ref 3.5–5.0)
CO2: 23 mEq/L (ref 22–29)
Calcium: 9.6 mg/dL (ref 8.4–10.4)
Chloride: 98 mEq/L (ref 98–107)
Potassium: 4.1 mEq/L (ref 3.5–5.1)
Sodium: 132 mEq/L — ABNORMAL LOW (ref 136–145)
Total Protein: 7.2 g/dL (ref 6.4–8.3)

## 2012-09-05 LAB — UA PROTEIN, DIPSTICK - CHCC: Protein, ur: 30 mg/dL

## 2012-09-05 LAB — SODIUM, URINE, RANDOM: Sodium, Ur: 78 mEq/L

## 2012-09-05 LAB — POCT INR: INR: 2.7

## 2012-09-05 LAB — LACTATE DEHYDROGENASE (CC13): LDH: 240 U/L — ABNORMAL HIGH (ref 125–220)

## 2012-09-05 MED ORDER — AMLODIPINE BESYLATE 5 MG PO TABS: 10.0000 mg | ORAL_TABLET | Freq: Every day | ORAL | Status: DC

## 2012-09-05 MED ORDER — SODIUM CHLORIDE 0.9 % IV SOLN
10.0000 mg/kg | Freq: Once | INTRAVENOUS | Status: AC
  Administered 2012-09-05: 650 mg via INTRAVENOUS
  Filled 2012-09-05: qty 26

## 2012-09-05 MED ORDER — SODIUM CHLORIDE 0.9 % IV SOLN
Freq: Once | INTRAVENOUS | Status: AC
  Administered 2012-09-05: 16:00:00 via INTRAVENOUS

## 2012-09-05 MED ORDER — VALACYCLOVIR HCL 500 MG PO TABS: 500.0000 mg | ORAL_TABLET | Freq: Every day | ORAL | Status: DC

## 2012-09-05 MED ORDER — LEVETIRACETAM 500 MG PO TABS: 500.0000 mg | ORAL_TABLET | Freq: Two times a day (BID) | ORAL | Status: DC

## 2012-09-05 MED ORDER — AMLODIPINE BESYLATE 10 MG PO TABS: 10.0000 mg | ORAL_TABLET | Freq: Every day | ORAL | Status: DC

## 2012-09-05 NOTE — Progress Notes (Signed)
Contine Coumadin at 5mg  daily except 7.5mg  on M&F Recheck INR on 09/19/12 with lab at 2:30pm, infusion at 3:00pm and Coumadin Clinic at 3:15pm. No drug interactions noted between Coumadin and Temodar, Keppra, or Valtrex.

## 2012-09-05 NOTE — Progress Notes (Signed)
Hematology and Oncology Follow Up Visit  Mercedes Barnes [id number] 1961/07/25 51 y.o. 09/05/2012 2:08 PM   Principle Diagnosis: Encounter Diagnoses  Name Primary?  . Glioblastoma multiforme of temporal lobe Yes  . Pulmonary embolism   . Hypothyroid   . 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 She underwent debulking surgery. Initial clinical trial with Temodar, irinotecan, Avastin. Early complications with bilateral pulmonary emboli and diffuse bilateral interstitial pulmonary infiltrates, suspected pneumocystis pneumonia, in April 2010. Subsequent treatment off protocol with cranial irradiation with concomitant low-dose oral Temodar 07/21 through 07/16/2009. Subsequent maintenance Temodar 200 mg/sq m 5 days each month. Early progression while on Temodar in December 2010. Avastin added back at that point with disease stabilization. 12 cycles of adjuvant Temodar plus Avastin completed 08/11/2010. She was then put on maintenance Avastin 15 mg/kg q.4 weeks.   Unfortunately, we received notification last week on October 14. Followup MRI brain done at [facility] on that day shows signs of further progression. We were advised to start her back on  Temodar low dose 80 mg daily and to increase the Avastin back to 10 mg per kilogram IV every 2 weeks.  Both she and her husband suspected that there was progression. Over the last 2-3 weeks she's had problems with memory and balance. She has been falling. She has been dragging her left leg again. She has developed change in her vision. Intermittent mild headaches. Headache was never a prominent symptom at time of initial diagnosis.     Medications: reviewed  Allergies:  Allergies  Allergen Reactions  . Co-Trimoxazole Injection  (Sulfamethoxazole-Trimethoprim) Hives  . Pentamidine     Hypotension & weakness    Review of Systems: Constitutional:   No constitutional symptoms Respiratory:no cough or dyspnea Cardiovascular:  No chest pain or palpitations Gastrointestinal:no change in bowel habit Genito-Urinary: no urinary tract symptoms Musculoskeletal:no muscle or bone pain Neurologic:see above Skin:no rash or ecchymosis Remaining ROS negative.  Physical Exam: Blood pressure 140/94, pulse 78, temperature 97.2 F (36.2 C), temperature source Oral, resp. rate 20, height 5\' 3"  (1.6 m), weight 146 lb 9.6 oz (66.497 kg). Wt Readings from Last 3 Encounters:  09/05/12 146 lb 9.6 oz (66.497 kg)  06/13/12 140 lb 12.8 oz (63.866 kg)  03/21/12 138 lb (62.596 kg)     General appearance: well-nourished Caucasian woman [name]: pharynx no erythema or exudate Lymph nodes: no adenopathy Breasts:not examined Lungs:clear to auscultation resonant to percussion Heart:regular rhythm no murmur Abdomen:soft, nontender, no mass, no organomegaly Extremities:no edema, no calf tenderness Vascular:no cyanosis Neurologic:she is alert and oriented x3, she can do simple calculations with ease, she can spell the word house backwards with ease, PERRLA, optic disc sharp, vessels normal, no hemorrhage or exudate, there is a left lateral visual field cut, motor strength is 5 over 5, reflexes 2+ symmetric, there is clumsiness of the left hand on finger to finger and rapid alternating movements. There is some past pointing on a test of coordination of her upper extremity due to decreased vision. Skin:  Lab Results: Lab Results  Component Value Date   WBC 6.9 09/05/2012   HGB 13.6 09/05/2012   HCT 40.4 09/05/2012   MCV 92.2 09/05/2012   PLT 301 09/05/2012     Chemistry      Component Value Date/Time  NA 125* 08/08/2012 1511   NA 133* 06/13/2012 1145   NA 138 03/20/2009 1454   K 4.3 08/08/2012 1511   K 4.0 06/13/2012 1145   K 3.8  03/20/2009 1454   CL 94* 08/08/2012 1511   CL 95* 06/13/2012 1145   CL 100 03/20/2009 1454   CO2 22 08/08/2012 1511   CO2 27 06/13/2012 1145   CO2 27 03/20/2009 1454   BUN 13.0 08/08/2012 1511   BUN 14 06/13/2012 1145   BUN 18 03/20/2009 1454   CREATININE 0.6 08/08/2012 1511   CREATININE 0.53 06/13/2012 1145   CREATININE 0.6 03/20/2009 1454      Component Value Date/Time   CALCIUM 9.6 08/08/2012 1511   CALCIUM 10.1 06/13/2012 1145   CALCIUM 8.6 03/20/2009 1454   ALKPHOS 53 08/08/2012 1511   ALKPHOS 56 06/13/2012 1145   ALKPHOS 65 03/20/2009 1454   AST 27 08/08/2012 1511   AST 22 06/13/2012 1145   AST 14 03/20/2009 1454   ALT 21 08/08/2012 1511   ALT 19 06/13/2012 1145   BILITOT 0.30 08/08/2012 1511   BILITOT 0.3 06/13/2012 1145   BILITOT 0.50 03/20/2009 1454       Radiological Studies: No results found.MRI brain  done at [facility] last week. Report pending.  Impression and Plan: #1. Second progression of glioblastoma multiforme Plan: See discussion above. Resume Temodar 80 mg by mouth daily, increase Avastin to 10 mg per kilogram IV to every 2 weeks. I will put her back on Valtrex prophylaxis 500 mg daily. Husband also asked about antiseizure prophylaxis. I think this is reasonable. I am going to start her on Keppra 500 mg twice daily. This is a most unfortunate occurrence in this very sweet lady who apologized to me today for being a problem!  #2. History of pulmonary embolism at time of initial diagnosis of brain cancer. She is on chronic anticoagulation. Currently on Coumadin. In view of additional medications added above I will check her PT/INR again next week and make adjustments as indicated.  #3. Hypothyroidism on replacement  #4. 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 Dr. Ruben Reason; Dr. Dayton Bailiff; Dr. Gery Pray; Dr.Dejardins, [facility]   Annia Belt, MD 10/21/20132:08 PM

## 2012-09-05 NOTE — Telephone Encounter (Signed)
Printed and gv appt schedule for Nov

## 2012-09-05 NOTE — Patient Instructions (Signed)
Contine Coumadin at 1.5 tablets on Monday & Friday and 1 tablet all other days.  Recheck INR on 09/19/12 with lab at 2:30pm, infusion at 3:00pm and Coumadin Clinic at 3:15pm.

## 2012-09-05 NOTE — Patient Instructions (Addendum)
[facility] Discharge Instructions for Patients Receiving Chemotherapy   If you develop nausea and vomiting that is not controlled by your nausea medication, call the clinic.  BELOW ARE SYMPTOMS THAT SHOULD BE REPORTED IMMEDIATELY:  *FEVER GREATER THAN 100.5 F  *CHILLS WITH OR WITHOUT FEVER  NAUSEA AND VOMITING THAT IS NOT CONTROLLED WITH YOUR NAUSEA MEDICATION  *UNUSUAL SHORTNESS OF BREATH  *UNUSUAL BRUISING OR BLEEDING  TENDERNESS IN MOUTH AND THROAT WITH OR WITHOUT PRESENCE OF ULCERS  *URINARY PROBLEMS  *BOWEL PROBLEMS  UNUSUAL RASH Items with * indicate a potential emergency and should be followed up as soon as possible.

## 2012-09-06 ENCOUNTER — Telehealth: Payer: Self-pay | Admitting: Pharmacist

## 2012-09-06 ENCOUNTER — Other Ambulatory Visit: Payer: Self-pay | Admitting: *Deleted

## 2012-09-06 ENCOUNTER — Telehealth: Payer: Self-pay | Admitting: Oncology

## 2012-09-06 NOTE — Telephone Encounter (Signed)
Fax labs to Dr.[employer] office.

## 2012-09-06 NOTE — Telephone Encounter (Signed)
Left VM for pt to call and schedule coumadin clinic appt for 09/12/12 per Dr. Cyndie Chime.

## 2012-09-07 ENCOUNTER — Telehealth: Payer: Self-pay | Admitting: Pharmacist

## 2012-09-12 ENCOUNTER — Ambulatory Visit (HOSPITAL_BASED_OUTPATIENT_CLINIC_OR_DEPARTMENT_OTHER): Payer: Medicare Other | Admitting: Pharmacist

## 2012-09-12 ENCOUNTER — Other Ambulatory Visit (HOSPITAL_BASED_OUTPATIENT_CLINIC_OR_DEPARTMENT_OTHER): Payer: Medicare Other

## 2012-09-12 DIAGNOSIS — Z7901 Long term (current) use of anticoagulants: Secondary | ICD-10-CM

## 2012-09-12 DIAGNOSIS — I2699 Other pulmonary embolism without acute cor pulmonale: Secondary | ICD-10-CM

## 2012-09-12 DIAGNOSIS — C712 Malignant neoplasm of temporal lobe: Secondary | ICD-10-CM

## 2012-09-12 LAB — CBC WITH DIFFERENTIAL/PLATELET
BASO%: 0.1 % (ref 0.0–2.0)
EOS%: 0 % (ref 0.0–7.0)
MCH: 31.1 pg (ref 25.1–34.0)
MCHC: 34.6 g/dL (ref 31.5–36.0)
MONO#: 0.6 10*3/uL (ref 0.1–0.9)
NEUT%: 78.1 % — ABNORMAL HIGH (ref 38.4–76.8)
RBC: 4.12 10*6/uL (ref 3.70–5.45)
WBC: 8.8 10*3/uL (ref 3.9–10.3)
lymph#: 1.3 10*3/uL (ref 0.9–3.3)
nRBC: 0 % (ref 0–0)

## 2012-09-12 LAB — PROTIME-INR
INR: 3.6 — ABNORMAL HIGH (ref 2.00–3.50)
Protime: 43.2 Seconds — ABNORMAL HIGH (ref 10.6–13.4)

## 2012-09-12 NOTE — Patient Instructions (Addendum)
Decrease Coumadin to 5mg  daily.  Recheck INR on 09/19/12 with lab at 2:30pm, infusion at 3:00pm and Coumadin Clinic at 3:15pm.

## 2012-09-12 NOTE — Progress Notes (Signed)
Pt started feeling dizziness and lightheaded last week. Husband questioned whether it could be medication related. It may possibly be related to starting Keppra. Her blood pressure medication (Norvasc) was also increased from 5mg  daily to 10mg  daily at MD visit on 09/05/12. BP today = 125/83, Pulse = 64 Pt started dexamethasone on 09/08/12 as instructed by MD at [facility]. INR likely above goal secondary to interaction with Dexamethasone. Decrease Coumadin to 5mg  daily.  Recheck INR on 09/19/12 with lab at 2:30pm, infusion at 3:00pm and Coumadin Clinic at 3:15pm.

## 2012-09-19 ENCOUNTER — Ambulatory Visit (HOSPITAL_BASED_OUTPATIENT_CLINIC_OR_DEPARTMENT_OTHER): Payer: Medicare Other

## 2012-09-19 ENCOUNTER — Ambulatory Visit: Payer: Medicare Other | Admitting: Pharmacist

## 2012-09-19 ENCOUNTER — Other Ambulatory Visit (HOSPITAL_BASED_OUTPATIENT_CLINIC_OR_DEPARTMENT_OTHER): Payer: Medicare Other | Admitting: Lab

## 2012-09-19 VITALS — BP 137/87 | HR 54 | Temp 97.5°F | Resp 20

## 2012-09-19 DIAGNOSIS — I2699 Other pulmonary embolism without acute cor pulmonale: Secondary | ICD-10-CM

## 2012-09-19 DIAGNOSIS — C712 Malignant neoplasm of temporal lobe: Secondary | ICD-10-CM

## 2012-09-19 DIAGNOSIS — Z5112 Encounter for antineoplastic immunotherapy: Secondary | ICD-10-CM

## 2012-09-19 DIAGNOSIS — C719 Malignant neoplasm of brain, unspecified: Secondary | ICD-10-CM

## 2012-09-19 DIAGNOSIS — Z7901 Long term (current) use of anticoagulants: Secondary | ICD-10-CM

## 2012-09-19 LAB — PROTIME-INR

## 2012-09-19 LAB — CBC WITH DIFFERENTIAL/PLATELET
Basophils Absolute: 0 10*3/uL (ref 0.0–0.1)
EOS%: 0 % (ref 0.0–7.0)
Eosinophils Absolute: 0 10*3/uL (ref 0.0–0.5)
HGB: 13.7 g/dL (ref 11.6–15.9)
LYMPH%: 13.7 % — ABNORMAL LOW (ref 14.0–49.7)
MCH: 31.2 pg (ref 25.1–34.0)
MCV: 90.7 fL (ref 79.5–101.0)
MONO%: 10.1 % (ref 0.0–14.0)
NEUT#: 7.5 10*3/uL — ABNORMAL HIGH (ref 1.5–6.5)
Platelets: 385 10*3/uL (ref 145–400)
RBC: 4.39 10*6/uL (ref 3.70–5.45)
RDW: 14.4 % (ref 11.2–14.5)

## 2012-09-19 MED ORDER — SODIUM CHLORIDE 0.9 % IV SOLN
Freq: Once | INTRAVENOUS | Status: AC
  Administered 2012-09-19: 15:00:00 via INTRAVENOUS

## 2012-09-19 MED ORDER — SODIUM CHLORIDE 0.9 % IV SOLN
10.0000 mg/kg | Freq: Once | INTRAVENOUS | Status: AC
  Administered 2012-09-19: 650 mg via INTRAVENOUS
  Filled 2012-09-19: qty 26

## 2012-09-19 NOTE — Progress Notes (Signed)
I spoke to pt husband on phone.  Per Dr. Patsy Lager instruction, hold Coumadin and take vitamin K 2.5mg  tonight   Will check PT/INR tomorrow.  Vitamin K tablet available at [facility] in [facility] and prescription called into the pharmacy.  Pt husband understood above instructions.

## 2012-09-20 ENCOUNTER — Other Ambulatory Visit (HOSPITAL_BASED_OUTPATIENT_CLINIC_OR_DEPARTMENT_OTHER): Payer: Medicare Other | Admitting: Lab

## 2012-09-20 ENCOUNTER — Ambulatory Visit (HOSPITAL_BASED_OUTPATIENT_CLINIC_OR_DEPARTMENT_OTHER): Payer: Medicare Other | Admitting: Pharmacist

## 2012-09-20 DIAGNOSIS — I2699 Other pulmonary embolism without acute cor pulmonale: Secondary | ICD-10-CM

## 2012-09-20 LAB — PROTIME-INR
INR: 1.9 — ABNORMAL LOW (ref 2.00–3.50)
Protime: 22.8 Seconds — ABNORMAL HIGH (ref 10.6–13.4)

## 2012-09-20 LAB — POCT INR: INR: 1.9

## 2012-09-20 MED ORDER — WARFARIN SODIUM 4 MG PO TABS: 4.0000 mg | ORAL_TABLET | Freq: Every day | ORAL | Status: DC

## 2012-09-20 NOTE — Progress Notes (Signed)
Begin Coumadin 4mg daily.  Recheck INR on 09/26/12 with already scheduled lab at 2:15pm, Lisa at 2:45pm and coumadin clinic at 3:15pm. 

## 2012-09-20 NOTE — Patient Instructions (Signed)
Begin Coumadin 4mg  daily.  Recheck INR on 09/26/12 with already scheduled lab at 2:15pm, Misty Stanley at 2:45pm and coumadin clinic at 3:15pm.

## 2012-09-22 ENCOUNTER — Telehealth: Payer: Self-pay | Admitting: *Deleted

## 2012-09-22 NOTE — Telephone Encounter (Addendum)
Received call from a NP @ [facility] reporting that pt's tumor is growing & they want to add CPT11 125 mg/m2 with next avastin.  She reports that a progress note will be faxed to [place] & Dr. Cyndie Chime can call her @ [phone].  Note to Dr Cyndie Chime.

## 2012-09-26 ENCOUNTER — Telehealth: Payer: Self-pay | Admitting: Oncology

## 2012-09-26 ENCOUNTER — Other Ambulatory Visit (HOSPITAL_BASED_OUTPATIENT_CLINIC_OR_DEPARTMENT_OTHER): Payer: Medicare Other

## 2012-09-26 ENCOUNTER — Ambulatory Visit (HOSPITAL_BASED_OUTPATIENT_CLINIC_OR_DEPARTMENT_OTHER): Payer: Medicare Other | Admitting: Nurse Practitioner

## 2012-09-26 ENCOUNTER — Ambulatory Visit: Payer: Medicare Other | Admitting: Pharmacist

## 2012-09-26 VITALS — BP 126/81 | HR 70 | Temp 97.0°F | Resp 18 | Ht 63.0 in | Wt 145.5 lb

## 2012-09-26 DIAGNOSIS — Z7901 Long term (current) use of anticoagulants: Secondary | ICD-10-CM

## 2012-09-26 DIAGNOSIS — C712 Malignant neoplasm of temporal lobe: Secondary | ICD-10-CM

## 2012-09-26 DIAGNOSIS — I2699 Other pulmonary embolism without acute cor pulmonale: Secondary | ICD-10-CM

## 2012-09-26 LAB — PROTIME-INR
INR: 3.7 — ABNORMAL HIGH (ref 2.00–3.50)
Protime: 44.4 Seconds — ABNORMAL HIGH (ref 10.6–13.4)

## 2012-09-26 LAB — COMPREHENSIVE METABOLIC PANEL (CC13)
Alkaline Phosphatase: 49 U/L (ref 40–150)
BUN: 21 mg/dL (ref 7.0–26.0)
Creatinine: 0.6 mg/dL (ref 0.6–1.1)
Glucose: 151 mg/dl — ABNORMAL HIGH (ref 70–99)
Sodium: 131 mEq/L — ABNORMAL LOW (ref 136–145)
Total Bilirubin: 0.26 mg/dL (ref 0.20–1.20)

## 2012-09-26 LAB — CBC WITH DIFFERENTIAL/PLATELET
BASO%: 0.1 % (ref 0.0–2.0)
Eosinophils Absolute: 0 10*3/uL (ref 0.0–0.5)
MONO#: 0.7 10*3/uL (ref 0.1–0.9)
NEUT#: 9.6 10*3/uL — ABNORMAL HIGH (ref 1.5–6.5)
RBC: 4.37 10*6/uL (ref 3.70–5.45)
RDW: 14.8 % — ABNORMAL HIGH (ref 11.2–14.5)
WBC: 11.4 10*3/uL — ABNORMAL HIGH (ref 3.9–10.3)

## 2012-09-26 NOTE — Progress Notes (Signed)
Change coumadin to 3mg  daily. Will re-check in 1 week on 10/03/12 with lab at 2:30pm and see patient during infusion  Pt saw Misty Stanley today. After discussion with Dr. Reece Agar, they changed medications and decreased Coumadin dose to 3mg . Awaiting plans after Dr. Reece Agar has conversation [facility] regarding future therapy. She will be seen during infusion on Monday, November 18th.

## 2012-09-26 NOTE — Patient Instructions (Signed)
Change coumadin to 3mg  daily. Will re-check in 1 week on 10/03/12 with lab at 2:30pm and see patient during infusion

## 2012-09-26 NOTE — Progress Notes (Signed)
OFFICE PROGRESS NOTE  Interval history:  Mercedes Barnes and is a 51 year old woman diagnosed with a cystic high-grade glioblastoma of the right frontal and parietal lobe with extension to the basal ganglia in April 2010. She underwent debulking surgery. She was initially treated on a clinical trial of Temodar, irinotecan and Avastin. Course was complicated by bilateral pulmonary emboli and diffuse bilateral interstitial pulmonary infiltrates, suspected pneumocystis pneumonia in April 2010. She was subsequently treated off protocol with cranial radiation and concomitant low-dose oral Temodar 06/05/2009 through 07/16/2009. She was then treated with maintenance Temodar 200 mg per meter square for 5 days each month. There was evidence of early progression while on Temodar in December 2010. Avastin was resumed at that time with disease stabilization. She completed 12 cycles of adjuvant Temodar plus Avastin 08/11/2010. She then began maintenance Avastin 15 mg per kilogram every 4 weeks.  Followup MRI of the brain done at [facility] on 08/29/2012 showed signs of progression. It was recommended to start her back on Temodar 80 mg daily and to increase Avastin back to 10 mg per kilogram IV every 2 weeks. She was treated with Avastin on 09/05/2012 and 09/19/2012.  We received a call from [facility] on 09/22/2012 that the tumor was growing and they advised adding irinotecan 125 mg per meter squared with the next Avastin.  Mercedes Barnes is maintained on Coumadin due to a history of pulmonary emboli. The INR was slightly elevated above goal range on 09/12/2012 at 3.6. The dose was adjusted. Repeat INR on 09/19/2012 returned at 7.38. Coumadin was placed on hold and she was given vitamin K 2.5 mg. Followup INR on 09/20/2012 returned at 1.9. Coumadin was resumed at a dose of 4 mg daily.  Mercedes Barnes denies any bleeding. No unusual headaches. No recent falls. She continues to note partial loss of vision at the left periphery. She  continues to have intermittent lightheadedness and dizziness. She thinks the lightheadedness and dizziness may be related to [place].   Objective: Blood pressure 126/81, pulse 70, temperature 97 F (36.1 C), temperature source Oral, resp. rate 18, height 5\' 3"  (1.6 m), weight 145 lb 8 oz (65.998 kg).  Oropharynx is without thrush or ulceration. Lungs are clear. Regular cardiac rhythm. Abdomen is soft and nontender. No organomegaly. Extremities are without edema. Calves are soft and nontender. Motor strength 5 over 5. Left lateral visual field cut. Finger to nose intact on the right. She had difficulty on the left.  Lab Results: Lab Results  Component Value Date   WBC 11.4* 09/26/2012   HGB 13.4 09/26/2012   HCT 40.0 09/26/2012   MCV 91.5 09/26/2012   PLT 254 09/26/2012    Chemistry:    Chemistry      Component Value Date/Time   NA 131* 09/26/2012 1412   NA 133* 06/13/2012 1145   NA 138 03/20/2009 1454   K 4.0 09/26/2012 1412   K 4.0 06/13/2012 1145   K 3.8 03/20/2009 1454   CL 101 09/26/2012 1412   CL 95* 06/13/2012 1145   CL 100 03/20/2009 1454   CO2 23 09/26/2012 1412   CO2 27 06/13/2012 1145   CO2 27 03/20/2009 1454   BUN 21.0 09/26/2012 1412   BUN 14 06/13/2012 1145   BUN 18 03/20/2009 1454   CREATININE 0.6 09/26/2012 1412   CREATININE 0.53 06/13/2012 1145   CREATININE 0.6 03/20/2009 1454      Component Value Date/Time   CALCIUM 8.9 09/26/2012 1412   CALCIUM 10.1 06/13/2012 1145  CALCIUM 8.6 03/20/2009 1454   ALKPHOS 49 09/26/2012 1412   ALKPHOS 56 06/13/2012 1145   ALKPHOS 65 03/20/2009 1454   AST 16 09/26/2012 1412   AST 22 06/13/2012 1145   AST 14 03/20/2009 1454   ALT 37 09/26/2012 1412   ALT 19 06/13/2012 1145   BILITOT 0.26 09/26/2012 1412   BILITOT 0.3 06/13/2012 1145   BILITOT 0.50 03/20/2009 1454       Studies/Results: No results found.  Medications: I have reviewed the patient's current medications.  Assessment/Plan:  1. Cystic glioblastoma multiforme-she was  diagnosed with a cystic high-grade GBM of the right frontal and parietal lobe of the brain with extension to the basal ganglia April 2010. She underwent debulking surgery. She was initially treated on a clinical trial with Temodar, irinotecan and Avastin. Course was complicated by development of bilateral pulmonary emboli and diffuse bilateral interstitial pulmonary infiltrates and suspected pneumocystis pneumonia April 2010. She was subsequently treated off protocol with cranial radiation with concomitant low-dose oral Temodar 07/21 through 07/16/2009. Subsequent maintenance Temodar 200 mg per meter squared 5 days each month. There was early progression while on Temodar December 2010. Avastin was added back at that time with disease stabilization. She completed 12 cycles of adjuvant Temodar plus Avastin 08/11/2010. She is currently on maintenance Avastin 15 mg per kilogram every 4 weeks. MRI of the brain at [facility] on 05/26/2012 with no evidence of disease progression. MRI of the brain at [facility] on 08/29/2012 with evidence of disease progression. Temodar resumed at a daily dose and Avastin change to 10 mg per kilogram IV every 2 weeks. 2. Bilateral pulmonary emboli occurring in July 2010. She is maintained on Coumadin. The Coumadin is followed through the Coumadin clinic in our office. 3. History of bilateral interstitial pneumonia occurring around the time of initial diagnosis of GBM. She was treated empirically for Pneumocystis.  Disposition-Dr. Beryle Beams is awaiting a return call from Dr. Hermenia Fiscal at [facility] neurooncology regarding the addition of irinotecan to the current regimen of Temodar and Avastin. We did review potential toxicities associated with irinotecan including myelosuppression and diarrhea, possibly severe.  Mercedes Barnes is experiencing intermittent dizziness and lightheadedness. She is concerned the symptoms are related to [place]. Keppra was recently resumed for seizure prophylaxis. She would  like to decrease the Keppra dose from twice daily to once daily. We instructed her that it would be okay to do so. The symptoms she is experiencing may be related to the brain tumor.  With regard to anticoagulation we are continuing Coumadin for now. The dose will be decreased to 3 mg daily. We will have her return on 09/30/2012 for a followup PT/INR. Dr. Beryle Beams will discuss the indication for continuing anticoagulation versus changing to an alternate medication given the recent difficulty in managing her Coumadin dose with Dr. Hermenia Fiscal.  Mercedes Barnes is scheduled to return for Avastin in one week. We will contact her and her husband regarding the addition of irinotecan once Dr. Beryle Beams has spoken with her physician at [facility].  She will return for a followup visit on 10/17/2012. She will contact the office in the interim with any problems.   Plan reviewed with Dr. Beryle Beams.  Ned Card ANP/GNP-BC    CC Dr. Ruben Reason, Dr. Governor Rooks, Dr. Gery Pray, Dr. Hermenia Fiscal at [facility] neurooncology.

## 2012-09-26 NOTE — Telephone Encounter (Signed)
appts made and printed for pt aom °

## 2012-09-30 ENCOUNTER — Other Ambulatory Visit: Payer: Self-pay | Admitting: Oncology

## 2012-10-03 ENCOUNTER — Other Ambulatory Visit (HOSPITAL_BASED_OUTPATIENT_CLINIC_OR_DEPARTMENT_OTHER): Payer: Medicare Other | Admitting: Lab

## 2012-10-03 ENCOUNTER — Ambulatory Visit: Payer: Medicare Other | Admitting: Pharmacist

## 2012-10-03 ENCOUNTER — Other Ambulatory Visit: Payer: Self-pay | Admitting: Oncology

## 2012-10-03 ENCOUNTER — Ambulatory Visit (HOSPITAL_BASED_OUTPATIENT_CLINIC_OR_DEPARTMENT_OTHER): Payer: Medicare Other

## 2012-10-03 ENCOUNTER — Other Ambulatory Visit: Payer: Self-pay | Admitting: *Deleted

## 2012-10-03 ENCOUNTER — Other Ambulatory Visit: Payer: Medicare Other | Admitting: Lab

## 2012-10-03 VITALS — BP 138/83 | HR 65 | Temp 97.4°F

## 2012-10-03 DIAGNOSIS — Z5111 Encounter for antineoplastic chemotherapy: Secondary | ICD-10-CM

## 2012-10-03 DIAGNOSIS — Z7901 Long term (current) use of anticoagulants: Secondary | ICD-10-CM

## 2012-10-03 DIAGNOSIS — C712 Malignant neoplasm of temporal lobe: Secondary | ICD-10-CM

## 2012-10-03 DIAGNOSIS — I2699 Other pulmonary embolism without acute cor pulmonale: Secondary | ICD-10-CM

## 2012-10-03 DIAGNOSIS — C719 Malignant neoplasm of brain, unspecified: Secondary | ICD-10-CM

## 2012-10-03 DIAGNOSIS — Z5112 Encounter for antineoplastic immunotherapy: Secondary | ICD-10-CM

## 2012-10-03 LAB — CBC WITH DIFFERENTIAL/PLATELET
Basophils Absolute: 0 10*3/uL (ref 0.0–0.1)
Eosinophils Absolute: 0 10*3/uL (ref 0.0–0.5)
HCT: 37.1 % (ref 34.8–46.6)
LYMPH%: 13.7 % — ABNORMAL LOW (ref 14.0–49.7)
MCV: 90.7 fL (ref 79.5–101.0)
MONO%: 6.4 % (ref 0.0–14.0)
NEUT#: 7.5 10*3/uL — ABNORMAL HIGH (ref 1.5–6.5)
NEUT%: 79.7 % — ABNORMAL HIGH (ref 38.4–76.8)
Platelets: 210 10*3/uL (ref 145–400)
RBC: 4.09 10*6/uL (ref 3.70–5.45)

## 2012-10-03 LAB — PROTIME-INR: Protime: 52.8 Seconds — ABNORMAL HIGH (ref 10.6–13.4)

## 2012-10-03 LAB — POCT INR: INR: 4.4

## 2012-10-03 MED ORDER — ONDANSETRON 8 MG/50ML IVPB (CHCC)
8.0000 mg | Freq: Once | INTRAVENOUS | Status: AC
  Administered 2012-10-03: 8 mg via INTRAVENOUS

## 2012-10-03 MED ORDER — SODIUM CHLORIDE 0.9 % IV SOLN
Freq: Once | INTRAVENOUS | Status: AC
  Administered 2012-10-03: 16:00:00 via INTRAVENOUS

## 2012-10-03 MED ORDER — SODIUM CHLORIDE 0.9 % IV SOLN
10.0000 mg/kg | Freq: Once | INTRAVENOUS | Status: AC
  Administered 2012-10-03: 650 mg via INTRAVENOUS
  Filled 2012-10-03: qty 26

## 2012-10-03 MED ORDER — IRINOTECAN HCL CHEMO INJECTION 100 MG/5ML
125.0000 mg/m2 | Freq: Once | INTRAVENOUS | Status: AC
  Administered 2012-10-03: 212 mg via INTRAVENOUS
  Filled 2012-10-03: qty 10.6

## 2012-10-03 NOTE — Progress Notes (Signed)
INR = 4.4 Pt on 3 mg/day I spoke w/ Dr. Cyndie Chime & he has weighed risk vs benefit of Coumadin.  At this time, we will d/c Coumadin due to risk of bleeding >> risk of re-thrombosis. I have discussed this with Mercedes Barnes & her husband today.  Her husband is hesitant & doesn't really agree with the decision.  I have explained the reasons Dr. Cyndie Chime has for stopping anticoagulation & that he has decided this with the help of Dr. Olivia Mackie at [facility]. Pt is ok with this plan. We covered s/sxs of clotting.  If these occur, pt or her husband will seek medical attention. I have archived this pt in [facility].

## 2012-10-03 NOTE — Patient Instructions (Addendum)
[facility] Discharge Instructions for Patients Receiving Chemotherapy  Today you received the following chemotherapy agents Avastin and  Irinotecan.  To help prevent nausea and vomiting after your treatment, we encourage you to take your nausea medication as prescribed.   If you develop nausea and vomiting that is not controlled by your nausea medication, call the clinic. If it is after clinic hours your family physician or the after hours number for the clinic or go to the Emergency Department.   BELOW ARE SYMPTOMS THAT SHOULD BE REPORTED IMMEDIATELY:  *FEVER GREATER THAN 100.5 F  *CHILLS WITH OR WITHOUT FEVER  NAUSEA AND VOMITING THAT IS NOT CONTROLLED WITH YOUR NAUSEA MEDICATION  *UNUSUAL SHORTNESS OF BREATH  *UNUSUAL BRUISING OR BLEEDING  TENDERNESS IN MOUTH AND THROAT WITH OR WITHOUT PRESENCE OF ULCERS  *URINARY PROBLEMS  *BOWEL PROBLEMS  UNUSUAL RASH Items with * indicate a potential emergency and should be followed up as soon as possible.  One of the nurses will contact you 24 hours after your treatment. Please let the nurse know about any problems that you may have experienced. Feel free to call the clinic you have any questions or concerns. The clinic phone number is [phone].   I have been informed and understand all the instructions given to me. I know to contact the clinic, my physician, or go to the Emergency Department if any problems should occur. I do not have any questions at this time, but understand that I may call the clinic during office hours   should I have any questions or need assistance in obtaining follow up care.    __________________________________________  _____________  __________ Signature of Patient or Authorized Representative            Date                   Time    __________________________________________ Nurse's Signature

## 2012-10-12 ENCOUNTER — Other Ambulatory Visit: Payer: Self-pay | Admitting: Oncology

## 2012-10-17 ENCOUNTER — Other Ambulatory Visit (HOSPITAL_BASED_OUTPATIENT_CLINIC_OR_DEPARTMENT_OTHER): Payer: Medicare Other | Admitting: Lab

## 2012-10-17 ENCOUNTER — Telehealth: Payer: Self-pay | Admitting: Oncology

## 2012-10-17 ENCOUNTER — Ambulatory Visit (HOSPITAL_BASED_OUTPATIENT_CLINIC_OR_DEPARTMENT_OTHER): Payer: Medicare Other

## 2012-10-17 ENCOUNTER — Telehealth: Payer: Self-pay | Admitting: *Deleted

## 2012-10-17 ENCOUNTER — Ambulatory Visit (HOSPITAL_BASED_OUTPATIENT_CLINIC_OR_DEPARTMENT_OTHER): Payer: Medicare Other | Admitting: Oncology

## 2012-10-17 VITALS — BP 135/86 | HR 73 | Temp 97.4°F | Resp 18 | Ht 63.0 in | Wt 147.0 lb

## 2012-10-17 VITALS — BP 142/90 | HR 73

## 2012-10-17 DIAGNOSIS — Z5111 Encounter for antineoplastic chemotherapy: Secondary | ICD-10-CM

## 2012-10-17 DIAGNOSIS — C712 Malignant neoplasm of temporal lobe: Secondary | ICD-10-CM

## 2012-10-17 DIAGNOSIS — Z5112 Encounter for antineoplastic immunotherapy: Secondary | ICD-10-CM

## 2012-10-17 DIAGNOSIS — C719 Malignant neoplasm of brain, unspecified: Secondary | ICD-10-CM

## 2012-10-17 DIAGNOSIS — Z86711 Personal history of pulmonary embolism: Secondary | ICD-10-CM

## 2012-10-17 DIAGNOSIS — R197 Diarrhea, unspecified: Secondary | ICD-10-CM

## 2012-10-17 LAB — CBC WITH DIFFERENTIAL/PLATELET
Basophils Absolute: 0 10*3/uL (ref 0.0–0.1)
EOS%: 0 % (ref 0.0–7.0)
HCT: 39.1 % (ref 34.8–46.6)
HGB: 13.5 g/dL (ref 11.6–15.9)
MCH: 31.2 pg (ref 25.1–34.0)
MCV: 90.3 fL (ref 79.5–101.0)
MONO%: 12.1 % (ref 0.0–14.0)
NEUT%: 59.8 % (ref 38.4–76.8)
lymph#: 1.3 10*3/uL (ref 0.9–3.3)

## 2012-10-17 LAB — COMPREHENSIVE METABOLIC PANEL (CC13)
Albumin: 3 g/dL — ABNORMAL LOW (ref 3.5–5.0)
CO2: 23 mEq/L (ref 22–29)
Calcium: 8.7 mg/dL (ref 8.4–10.4)
Glucose: 111 mg/dl — ABNORMAL HIGH (ref 70–99)
Sodium: 128 mEq/L — ABNORMAL LOW (ref 136–145)
Total Bilirubin: 0.29 mg/dL (ref 0.20–1.20)
Total Protein: 6.1 g/dL — ABNORMAL LOW (ref 6.4–8.3)

## 2012-10-17 LAB — TECHNOLOGIST REVIEW

## 2012-10-17 MED ORDER — SODIUM CHLORIDE 0.9 % IV SOLN
Freq: Once | INTRAVENOUS | Status: AC
  Administered 2012-10-17: 16:00:00 via INTRAVENOUS

## 2012-10-17 MED ORDER — IRINOTECAN HCL CHEMO INJECTION 100 MG/5ML
130.0000 mg/m2 | Freq: Once | INTRAVENOUS | Status: AC
  Administered 2012-10-17: 220 mg via INTRAVENOUS
  Filled 2012-10-17: qty 11

## 2012-10-17 MED ORDER — SODIUM CHLORIDE 0.9 % IV SOLN
10.0000 mg/kg | Freq: Once | INTRAVENOUS | Status: AC
  Administered 2012-10-17: 650 mg via INTRAVENOUS
  Filled 2012-10-17: qty 26

## 2012-10-17 MED ORDER — ONDANSETRON 8 MG/50ML IVPB (CHCC)
8.0000 mg | Freq: Once | INTRAVENOUS | Status: AC
  Administered 2012-10-17: 8 mg via INTRAVENOUS

## 2012-10-17 MED ORDER — ATROPINE SULFATE 1 MG/ML IJ SOLN
0.5000 mg | Freq: Once | INTRAMUSCULAR | Status: AC | PRN
  Administered 2012-10-17: 0.5 mg via INTRAVENOUS

## 2012-10-17 NOTE — Telephone Encounter (Signed)
appts made and printed for pt aom °

## 2012-10-17 NOTE — Patient Instructions (Signed)
[facility] Discharge Instructions for Patients Receiving Chemotherapy  Today you received the following chemotherapy agents Avastin and Irinotecan  To help prevent nausea and vomiting after your treatment, we encourage you to take your nausea medication as prescribed hours.   If you develop nausea and vomiting that is not controlled by your nausea medication, call the clinic. If it is after clinic hours your family physician or the after hours number for the clinic or go to the Emergency Department.   BELOW ARE SYMPTOMS THAT SHOULD BE REPORTED IMMEDIATELY:  *FEVER GREATER THAN 100.5 F  *CHILLS WITH OR WITHOUT FEVER  NAUSEA AND VOMITING THAT IS NOT CONTROLLED WITH YOUR NAUSEA MEDICATION  *UNUSUAL SHORTNESS OF BREATH  *UNUSUAL BRUISING OR BLEEDING  TENDERNESS IN MOUTH AND THROAT WITH OR WITHOUT PRESENCE OF ULCERS  *URINARY PROBLEMS  *BOWEL PROBLEMS  UNUSUAL RASH Items with * indicate a potential emergency and should be followed up as soon as possible.  Feel free to call the clinic you have any questions or concerns. The clinic phone number is [phone].   I have been informed and understand all the instructions given to me. I know to contact the clinic, my physician, or go to the Emergency Department if any problems should occur. I do not have any questions at this time, but understand that I may call the clinic during office hours   should I have any questions or need assistance in obtaining follow up care.

## 2012-10-17 NOTE — Progress Notes (Signed)
Hematology and Oncology Follow Up Visit  Mercedes Barnes [id number] 12-06-1960 51 y.o. 10/17/2012 7:15 PM   Principle Diagnosis: Encounter Diagnoses  Name Primary?  . Glioblastoma multiforme of temporal lobe Yes  . Brain cancer      Interim History:   Followup visit for this 51 year old woman under active treatment for a second progression of glioblastoma multiforme. Her maintenance regimen of monthly Avastin was modified back to a every two-week schedule and Irinotecan 125 mg per meter squared every [redacted] weeks along with Temodar 80 mg by mouth daily was added starting on 09/05/2012. She is tolerated the chemotherapy very well. She has had no nausea or vomiting. She did get some diarrhea up to 4 or 5 bowel movements daily for the first few days after the Camptosar. She did use Imodium with good results. She continues to have problems with vision and unsteady gait related to poor vision. Although she still appears to be mentating well, there are subtle changes. These are recognized by her husband who is constantly at her side. She denies any headache. No seizure activity. No focal weakness. She has had some episodes of dizziness. She feels at this was improved when she cut her Keppra dose down from 500 mg twice daily to 500 mg once daily.  Medicafotions: reviewed  Allergies:  Allergies  Allergen Reactions  . Co-Trimoxazole Injection (Sulfamethoxazole-Trimethoprim) Hives  . Pentamidine     Hypotension & weakness    Review of Systems: Constitutional:    appetite has increased significantly on Decadron 2 mg twice daily  Respiratory: no cough or dyspnea  Cardiovascular:   no chest pain or palpitation  Gastrointestinal: see above  Genito-Urinary:  not questioned  Musculoskeletal: no bone pain  Neurologic: see above  Skin: no rash  Remaining ROS negative.  Physical Exam: Blood pressure 135/86, pulse 73, temperature 97.4 F (36.3 C), temperature source Oral, resp. rate 18, height 5\' 3"   (1.6 m), weight 147 lb (66.679 kg). Wt Readings from Last 3 Encounters:  10/17/12 147 lb (66.679 kg)  09/26/12 145 lb 8 oz (65.998 kg)  09/05/12 146 lb 9.6 oz (66.497 kg)     General appearance:  well-nourished Caucasian woman  [name]: Pharynx no erythema or exudate Lymph nodes:  no adenopathy Breasts: Lungs: clear to auscultation resonant to percussion Heart: regular rhythm no murmur Abdomen:Soft, nontender, no mass, no organomegaly  Extremities: no edema, no calf tenderness  Vascular: no cyanosis  Neurologic: she is alert and oriented. She can spell the word house backwards with ease, PERRLA, optic disc sharp, vessels normal, no hemorrhage or exudate, there is a left lateral visual field defect, motor strength is 5 over 5, reflexes absent symmetric at the knees, 1+ symmetric at the biceps, upper body coordination: Slow and deliberate left upper extremity. Finger to finger, rapid alternating movements, normal. Gait is unsteady. Skin: No rash   Lab Results: Lab Results  Component Value Date   WBC 4.6 10/17/2012   HGB 13.5 10/17/2012   HCT 39.1 10/17/2012   MCV 90.3 10/17/2012   PLT 187 10/17/2012     Chemistry      Component Value Date/Time   NA 128* 10/17/2012 1351   NA 133* 06/13/2012 1145   NA 138 03/20/2009 1454   K 4.9 10/17/2012 1351   K 4.0 06/13/2012 1145   K 3.8 03/20/2009 1454   CL 94* 10/17/2012 1351   CL 95* 06/13/2012 1145   CL 100 03/20/2009 1454   CO2 23 10/17/2012 1351   CO2  27 06/13/2012 1145   CO2 27 03/20/2009 1454   BUN 31.0* 10/17/2012 1351   BUN 14 06/13/2012 1145   BUN 18 03/20/2009 1454   CREATININE 0.6 10/17/2012 1351   CREATININE 0.53 06/13/2012 1145   CREATININE 0.6 03/20/2009 1454      Component Value Date/Time   CALCIUM 8.7 10/17/2012 1351   CALCIUM 10.1 06/13/2012 1145   CALCIUM 8.6 03/20/2009 1454   ALKPHOS 52 10/17/2012 1351   ALKPHOS 56 06/13/2012 1145   ALKPHOS 65 03/20/2009 1454   AST 20 10/17/2012 1351   AST 22 06/13/2012 1145   AST 14 03/20/2009 1454   ALT 45  10/17/2012 1351   ALT 19 06/13/2012 1145   BILITOT 0.29 10/17/2012 1351   BILITOT 0.3 06/13/2012 1145   BILITOT 0.50 03/20/2009 1454       Impression and Plan: #1. 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 Initial diagnosis April 2010. First progression December 2010. Second progression October 2013. Plan: She continues on a recently started trial of daily low-dose temodar (80 mg), Camptosar 125 mg per meter squared IV every 2 weeks, and Avastin 10 mg per kilogram IV every 2 weeks. She is due for a followup MRI of the brain at [facility] on December 19.  Although blood counts look good so far, I anticipate we will have to make some dose reductions soon.   Unfortunately, a recent study in previously untreated patients looking at dose dense versus standard Temodar following initial radiation and low-dose Temodar, did not show any increased benefit with the dose dense chemotherapy but only increased toxicity.   #2. History of pulmonary embolus and bilateral interstitial pulmonary infiltrates shortly after initial surgery in April 2010 She's been on full dose Coumadin anticoagulation up until now. I feel the risk of full dose anticoagulation is higher than the benefit  now that we have put her on a more aggressive chemotherapy program. After discussion with her neuro-oncologist at [facility], we mutually agreed to hold anticoagulation at this time.      Annia Belt, MD 12/2/20137:15 PM

## 2012-10-17 NOTE — Telephone Encounter (Signed)
Per staff phone call and POF I have scheduled appts. JWM 

## 2012-10-18 ENCOUNTER — Other Ambulatory Visit: Payer: Self-pay | Admitting: *Deleted

## 2012-10-20 ENCOUNTER — Telehealth: Payer: Self-pay | Admitting: *Deleted

## 2012-10-20 ENCOUNTER — Other Ambulatory Visit: Payer: Self-pay | Admitting: *Deleted

## 2012-10-20 DIAGNOSIS — C712 Malignant neoplasm of temporal lobe: Secondary | ICD-10-CM

## 2012-10-20 MED ORDER — TEMOZOLOMIDE 20 MG PO CAPS: 80.0000 mg | ORAL_CAPSULE | Freq: Every day | ORAL | Status: DC

## 2012-10-20 NOTE — Telephone Encounter (Signed)
Received confirmation fax from [employer] that referral received & arrangements will be made once insurance verified & then delivery arrangements will be made with pt.

## 2012-10-31 ENCOUNTER — Other Ambulatory Visit (HOSPITAL_BASED_OUTPATIENT_CLINIC_OR_DEPARTMENT_OTHER): Payer: Medicare Other | Admitting: Lab

## 2012-10-31 ENCOUNTER — Ambulatory Visit (HOSPITAL_BASED_OUTPATIENT_CLINIC_OR_DEPARTMENT_OTHER): Payer: Medicare Other

## 2012-10-31 VITALS — BP 122/84 | HR 67 | Temp 97.8°F | Resp 22

## 2012-10-31 DIAGNOSIS — Z5112 Encounter for antineoplastic immunotherapy: Secondary | ICD-10-CM

## 2012-10-31 DIAGNOSIS — Z7901 Long term (current) use of anticoagulants: Secondary | ICD-10-CM

## 2012-10-31 DIAGNOSIS — C719 Malignant neoplasm of brain, unspecified: Secondary | ICD-10-CM

## 2012-10-31 DIAGNOSIS — I2699 Other pulmonary embolism without acute cor pulmonale: Secondary | ICD-10-CM

## 2012-10-31 DIAGNOSIS — C712 Malignant neoplasm of temporal lobe: Secondary | ICD-10-CM

## 2012-10-31 DIAGNOSIS — Z5111 Encounter for antineoplastic chemotherapy: Secondary | ICD-10-CM

## 2012-10-31 LAB — COMPREHENSIVE METABOLIC PANEL (CC13)
ALT: 38 U/L (ref 0–55)
AST: 17 U/L (ref 5–34)
Albumin: 2.6 g/dL — ABNORMAL LOW (ref 3.5–5.0)
BUN: 23 mg/dL (ref 7.0–26.0)
Calcium: 8.1 mg/dL — ABNORMAL LOW (ref 8.4–10.4)
Chloride: 103 mEq/L (ref 98–107)
Potassium: 3.6 mEq/L (ref 3.5–5.1)
Sodium: 133 mEq/L — ABNORMAL LOW (ref 136–145)
Total Protein: 5.4 g/dL — ABNORMAL LOW (ref 6.4–8.3)

## 2012-10-31 LAB — CBC WITH DIFFERENTIAL/PLATELET
Eosinophils Absolute: 0 10*3/uL (ref 0.0–0.5)
HCT: 35.7 % (ref 34.8–46.6)
LYMPH%: 36.3 % (ref 14.0–49.7)
MONO#: 0.7 10*3/uL (ref 0.1–0.9)
NEUT#: 2 10*3/uL (ref 1.5–6.5)
Platelets: 172 10*3/uL (ref 145–400)
RBC: 3.92 10*6/uL (ref 3.70–5.45)
WBC: 4.3 10*3/uL (ref 3.9–10.3)
lymph#: 1.6 10*3/uL (ref 0.9–3.3)
nRBC: 6 % — ABNORMAL HIGH (ref 0–0)

## 2012-10-31 MED ORDER — ONDANSETRON 8 MG/50ML IVPB (CHCC)
8.0000 mg | Freq: Once | INTRAVENOUS | Status: AC
  Administered 2012-10-31: 8 mg via INTRAVENOUS

## 2012-10-31 MED ORDER — SODIUM CHLORIDE 0.9 % IV SOLN
Freq: Once | INTRAVENOUS | Status: AC
  Administered 2012-10-31: 50 mL via INTRAVENOUS

## 2012-10-31 MED ORDER — ATROPINE SULFATE 1 MG/ML IJ SOLN
0.5000 mg | Freq: Once | INTRAMUSCULAR | Status: AC | PRN
  Administered 2012-10-31: 15:00:00 via INTRAVENOUS

## 2012-10-31 MED ORDER — SODIUM CHLORIDE 0.9 % IV SOLN
10.0000 mg/kg | Freq: Once | INTRAVENOUS | Status: AC
  Administered 2012-10-31: 650 mg via INTRAVENOUS
  Filled 2012-10-31: qty 26

## 2012-10-31 MED ORDER — IRINOTECAN HCL CHEMO INJECTION 100 MG/5ML
220.0000 mg | Freq: Once | INTRAVENOUS | Status: AC
  Administered 2012-10-31: 220 mg via INTRAVENOUS
  Filled 2012-10-31: qty 11

## 2012-10-31 NOTE — Progress Notes (Signed)
Pt. Arrived sitting in recliner without any acute distress. Husband informed me that he has noticed "change" in pt's breathing pattern last night.  He just noticed this yesterday.  Husband stated that it reminded him of 3 1/2 years ago when pt. Was dx. With "clots in her lungs".  " SHe was breathing the same way". Pt. Denied any active bleeding at this time, denied sob or any type of respiratory issues.  Occasional bleeding with BM due to hemorrhoids.  O2 sat. At 98% today, respiration at 22/min.  Above discussed with Lonna Cobb NP. Advised to discuss further with Dr. Cyndie Chime. HL   Discussed above with Dr. Reece Agar, okay to proceed with tmt.  Husband and pt. Made aware to call if she has further question or go to the nearest emergency facility if she would require any immediate attention. HL

## 2012-10-31 NOTE — Patient Instructions (Signed)
[facility] Discharge Instructions for Patients Receiving Chemotherapy  Today you received the following chemotherapy agents Avastin/CPT-11 To help prevent nausea and vomiting after your treatment, we encourage you to take your nausea medication as per Dr. Cyndie Chime.   If you develop nausea and vomiting that is not controlled by your nausea medication, call the clinic. If it is after clinic hours your family physician or the after hours number for the clinic or go to the Emergency Department.   BELOW ARE SYMPTOMS THAT SHOULD BE REPORTED IMMEDIATELY:  *FEVER GREATER THAN 100.5 F  *CHILLS WITH OR WITHOUT FEVER  NAUSEA AND VOMITING THAT IS NOT CONTROLLED WITH YOUR NAUSEA MEDICATION  *UNUSUAL SHORTNESS OF BREATH  *UNUSUAL BRUISING OR BLEEDING  TENDERNESS IN MOUTH AND THROAT WITH OR WITHOUT PRESENCE OF ULCERS  *URINARY PROBLEMS  *BOWEL PROBLEMS  UNUSUAL RASH Items with * indicate a potential emergency and should be followed up as soon as possible.   Feel free to call the clinic you have any questions or concerns. The clinic phone number is [phone].   I have been informed and understand all the instructions given to me. I know to contact the clinic, my physician, or go to the Emergency Department if any problems should occur. I do not have any questions at this time, but understand that I may call the clinic during office hours   should I have any questions or need assistance in obtaining follow up care.    __________________________________________  _____________  __________ Signature of Patient or Authorized Representative            Date                   Time    __________________________________________ Nurse's Signature

## 2012-11-04 ENCOUNTER — Ambulatory Visit (HOSPITAL_COMMUNITY)
Admission: RE | Admit: 2012-11-04 | Discharge: 2012-11-04 | Disposition: A | Discharge status: Home / Self Care | Payer: Medicare Other | Source: Ambulatory Visit | Attending: Oncology | Admitting: Oncology

## 2012-11-04 ENCOUNTER — Telehealth: Payer: Self-pay | Admitting: Oncology

## 2012-11-04 ENCOUNTER — Telehealth: Payer: Self-pay | Admitting: *Deleted

## 2012-11-04 ENCOUNTER — Ambulatory Visit (HOSPITAL_BASED_OUTPATIENT_CLINIC_OR_DEPARTMENT_OTHER): Payer: Medicare Other | Admitting: Nurse Practitioner

## 2012-11-04 ENCOUNTER — Other Ambulatory Visit (HOSPITAL_COMMUNITY): Payer: Self-pay | Admitting: Oncology

## 2012-11-04 VITALS — BP 112/73 | HR 72 | Temp 97.9°F | Resp 20 | Ht 63.0 in | Wt 148.4 lb

## 2012-11-04 DIAGNOSIS — M7989 Other specified soft tissue disorders: Secondary | ICD-10-CM

## 2012-11-04 DIAGNOSIS — C712 Malignant neoplasm of temporal lobe: Secondary | ICD-10-CM

## 2012-11-04 DIAGNOSIS — R600 Localized edema: Secondary | ICD-10-CM

## 2012-11-04 DIAGNOSIS — C719 Malignant neoplasm of brain, unspecified: Secondary | ICD-10-CM

## 2012-11-04 DIAGNOSIS — M79609 Pain in unspecified limb: Secondary | ICD-10-CM

## 2012-11-04 DIAGNOSIS — R609 Edema, unspecified: Secondary | ICD-10-CM

## 2012-11-04 NOTE — Progress Notes (Signed)
OFFICE PROGRESS NOTE  Interval history:  Mercedes Barnes is a 51 year old woman diagnosed with a cystic high-grade glioblastoma of the right frontal and parietal lobe with extension to the basal ganglia in April 2010. She underwent debulking surgery. She was initially treated on a clinical trial of Temodar, irinotecan and Avastin. Course was complicated by bilateral pulmonary emboli and diffuse bilateral interstitial pulmonary infiltrates, suspected pneumocystis pneumonia in April 2010. She was subsequently treated off protocol with cranial radiation and concomitant low-dose oral Temodar 06/05/2009 through 07/16/2009. She was then treated with maintenance Temodar 200 mg per meter square for 5 days each month. There was evidence of early progression while on Temodar in December 2010. Avastin was resumed at that time with disease stabilization. She completed 12 cycles of adjuvant Temodar plus Avastin 08/11/2010. She then began maintenance Avastin 15 mg per kilogram every 4 weeks.   Followup MRI of the brain done at [facility] on 08/29/2012 showed signs of progression. It was recommended to start her back on Temodar 80 mg daily and to increase Avastin back to 10 mg per kilogram IV every 2 weeks. She was treated with Avastin on 09/05/2012 and 09/19/2012.  We received a call from [facility] on 09/22/2012 that the tumor was growing and they advised adding irinotecan 125 mg per meter squared with the next Avastin. Irinotecan was added on a 2 week schedule beginning 10/03/2012.  She is seen today for scheduled followup. With the first few irinotecan treatments she had diarrhea. She noted less diarrhea following the most recent cycle. She is taking Imodium as needed. She denies nausea/vomiting. No skin rash. She has had a few mild headaches. She has a persistent left visual field cut. She denies any falls. Her husband notes asymmetric leg swelling. The left leg is more swollen than the right. In addition he periodically notes  "labored breathing". She does not feel short of breath. She denies chest pain or cough.  She had an appointment at [facility] earlier this week with a restaging MRI of the brain. They report improvement and brought some pictures from the MRI.   Objective: Blood pressure 112/73, pulse 72, temperature 97.9 F (36.6 C), temperature source Oral, resp. rate 20, height 5\' 3"  (1.6 m), weight 148 lb 6.4 oz (67.314 kg).  Pupils equal round and reactive to light. Extraocular movements are intact. She has a left lateral visual field cut. Oropharynx is without thrush or ulceration. Lungs are clear. No wheezes or rales. Regular cardiac rhythm. Abdomen is soft and nontender. No organomegaly. Pitting edema below the knees bilaterally left greater than right. Motor strength 5 over 5. Finger to nose intact. Rapid alternating movement intact with the right hand. She is somewhat clumsy with rapid alternating movements of the left hand. She is alert and oriented. Able to perform simple calculations.   Lab Results: Lab Results  Component Value Date   WBC 4.3 10/31/2012   HGB 12.3 10/31/2012   HCT 35.7 10/31/2012   MCV 91.1 10/31/2012   PLT 172 10/31/2012    Chemistry:    Chemistry      Component Value Date/Time   NA 133* 10/31/2012 1340   NA 133* 06/13/2012 1145   NA 138 03/20/2009 1454   K 3.6 10/31/2012 1340   K 4.0 06/13/2012 1145   K 3.8 03/20/2009 1454   CL 103 10/31/2012 1340   CL 95* 06/13/2012 1145   CL 100 03/20/2009 1454   CO2 22 10/31/2012 1340   CO2 27 06/13/2012 1145   CO2  27 03/20/2009 1454   BUN 23.0 10/31/2012 1340   BUN 14 06/13/2012 1145   BUN 18 03/20/2009 1454   CREATININE 0.6 10/31/2012 1340   CREATININE 0.53 06/13/2012 1145   CREATININE 0.6 03/20/2009 1454      Component Value Date/Time   CALCIUM 8.1* 10/31/2012 1340   CALCIUM 10.1 06/13/2012 1145   CALCIUM 8.6 03/20/2009 1454   ALKPHOS 49 10/31/2012 1340   ALKPHOS 56 06/13/2012 1145   ALKPHOS 65 03/20/2009 1454   AST 17 10/31/2012 1340   AST  22 06/13/2012 1145   AST 14 03/20/2009 1454   ALT 38 10/31/2012 1340   ALT 19 06/13/2012 1145   BILITOT 0.29 10/31/2012 1340   BILITOT 0.3 06/13/2012 1145   BILITOT 0.50 03/20/2009 1454       Studies/Results: No results found.  Medications: I have reviewed the patient's current medications.  Assessment/Plan:  1. 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 She underwent debulking surgery. She was initially treated on a clinical trial with Temodar, irinotecan and Avastin. Course was complicated by development of bilateral pulmonary emboli and diffuse bilateral interstitial pulmonary infiltrates and suspected pneumocystis pneumonia April 2010. She was subsequently treated off protocol with cranial radiation with concomitant low-dose oral Temodar 07/21 through 07/16/2009. Subsequent maintenance Temodar 200 mg per meter squared 5 days each month. There was early progression while on Temodar December 2010. Avastin was added back at that time with disease stabilization. She completed 12 cycles of adjuvant Temodar plus Avastin 08/11/2010. Maintenance Avastin continued every 4 weeks.  MRI of the brain at [facility] on 05/26/2012 with no evidence of disease progression. MRI of the brain at [facility] on 08/29/2012 with evidence of disease progression. Temodar resumed at a daily dose and Avastin change to 10 mg per kilogram IV every 2 weeks. Irinotecan added on a 2 week schedule beginning 10/03/2012. Recent restaging MRI of the brain with improvement (we do not have the final report). 2. Bilateral pulmonary emboli occurring in July 2010. Coumadin was discontinued following an office visit 10/17/2012. 3. History of bilateral interstitial pneumonia occurring around the time of initial diagnosis of GBM. She was treated empirically for Pneumocystis. 4. Asymmetric leg edema. We are referring her  for venous Doppler studies.  Disposition-Mercedes Barnes appears stable. Plan to continue Temodar and every 2 week Avastin/irinotecan. She reports being scheduled for a followup MRI of the brain at a 6 week interval at [facility]. Dr. Beryle Beams will see her in followup in approximately one month. She will contact the office in the interim with any problems.  Plan reviewed with Dr. Beryle Beams.  Mercedes Barnes ANP/GNP-BC

## 2012-11-04 NOTE — Telephone Encounter (Signed)
Per staff message and POF I have scheduled appts.  JMW  

## 2012-11-04 NOTE — Progress Notes (Signed)
VASCULAR LAB PRELIMINARY  PRELIMINARY  PRELIMINARY  PRELIMINARY  Bilateral lower extremity venous duplex completed.    Preliminary report: Negative for deep and superficial vein thrombosis.  There is a Baker's cyst in the left popliteal fossa.  Jakelin Taussig, RVT 11/04/2012, 8:33 PM

## 2012-11-04 NOTE — Telephone Encounter (Signed)
gv and printed appt schedule for pt for Dec and Jan 2014....scheduled doppler @ [facility]

## 2012-11-13 ENCOUNTER — Other Ambulatory Visit: Payer: Self-pay | Admitting: Oncology

## 2012-11-14 ENCOUNTER — Other Ambulatory Visit (HOSPITAL_BASED_OUTPATIENT_CLINIC_OR_DEPARTMENT_OTHER): Payer: Medicare Other | Admitting: Lab

## 2012-11-14 ENCOUNTER — Ambulatory Visit (HOSPITAL_BASED_OUTPATIENT_CLINIC_OR_DEPARTMENT_OTHER): Payer: Medicare Other

## 2012-11-14 VITALS — BP 111/78 | HR 73 | Temp 97.0°F | Resp 20

## 2012-11-14 DIAGNOSIS — C719 Malignant neoplasm of brain, unspecified: Secondary | ICD-10-CM

## 2012-11-14 DIAGNOSIS — Z5112 Encounter for antineoplastic immunotherapy: Secondary | ICD-10-CM

## 2012-11-14 DIAGNOSIS — Z7901 Long term (current) use of anticoagulants: Secondary | ICD-10-CM

## 2012-11-14 DIAGNOSIS — C712 Malignant neoplasm of temporal lobe: Secondary | ICD-10-CM

## 2012-11-14 DIAGNOSIS — I2699 Other pulmonary embolism without acute cor pulmonale: Secondary | ICD-10-CM

## 2012-11-14 LAB — CBC WITH DIFFERENTIAL/PLATELET
BASO%: 1.9 % (ref 0.0–2.0)
Basophils Absolute: 0.1 10*3/uL (ref 0.0–0.1)
EOS%: 0.7 % (ref 0.0–7.0)
HCT: 32.5 % — ABNORMAL LOW (ref 34.8–46.6)
HGB: 11.1 g/dL — ABNORMAL LOW (ref 11.6–15.9)
MCH: 31.4 pg (ref 25.1–34.0)
MCHC: 34.2 g/dL (ref 31.5–36.0)
MCV: 91.8 fL (ref 79.5–101.0)
MONO%: 13.3 % (ref 0.0–14.0)
NEUT%: 51.7 % (ref 38.4–76.8)
RDW: 18.8 % — ABNORMAL HIGH (ref 11.2–14.5)

## 2012-11-14 LAB — URINALYSIS, MICROSCOPIC - CHCC
Blood: NEGATIVE
Nitrite: NEGATIVE
Protein: 300 mg/dL
Specific Gravity, Urine: 1.015 (ref 1.003–1.035)

## 2012-11-14 LAB — TECHNOLOGIST REVIEW

## 2012-11-14 MED ORDER — ONDANSETRON 8 MG/50ML IVPB (CHCC)
8.0000 mg | Freq: Once | INTRAVENOUS | Status: AC
  Administered 2012-11-14: 8 mg via INTRAVENOUS

## 2012-11-14 MED ORDER — SODIUM CHLORIDE 0.9 % IV SOLN
10.0000 mg/kg | Freq: Once | INTRAVENOUS | Status: AC
  Administered 2012-11-14: 650 mg via INTRAVENOUS
  Filled 2012-11-14: qty 26

## 2012-11-14 MED ORDER — IRINOTECAN HCL CHEMO INJECTION 100 MG/5ML
130.0000 mg/m2 | Freq: Once | INTRAVENOUS | Status: AC
  Administered 2012-11-14: 220 mg via INTRAVENOUS
  Filled 2012-11-14: qty 11

## 2012-11-14 MED ORDER — ATROPINE SULFATE 1 MG/ML IJ SOLN
0.5000 mg | Freq: Once | INTRAMUSCULAR | Status: AC | PRN
  Administered 2012-11-14: 0.5 mg via INTRAVENOUS

## 2012-11-14 MED ORDER — SODIUM CHLORIDE 0.9 % IV SOLN
Freq: Once | INTRAVENOUS | Status: AC
  Administered 2012-11-14: 15:00:00 via INTRAVENOUS

## 2012-11-14 NOTE — Progress Notes (Signed)
Urine protein = 300 today; Avastin given today; 24-hour urine ordered and to be collected prior to next infusion; patient to begin 24-hour urine collection on 11/27/12 and return sample on 11/28/12; patient and husband verbalized understanding.

## 2012-11-15 ENCOUNTER — Other Ambulatory Visit: Payer: Self-pay | Admitting: Nurse Practitioner

## 2012-11-15 ENCOUNTER — Telehealth: Payer: Self-pay | Admitting: Oncology

## 2012-11-15 DIAGNOSIS — R809 Proteinuria, unspecified: Secondary | ICD-10-CM

## 2012-11-15 DIAGNOSIS — C712 Malignant neoplasm of temporal lobe: Secondary | ICD-10-CM

## 2012-11-15 NOTE — Telephone Encounter (Signed)
Talked to patient's husband gave him appt for 11/28/12, lab and chemo and see MD on 1/27 with lab, and chemo

## 2012-11-17 ENCOUNTER — Other Ambulatory Visit: Payer: Self-pay | Admitting: Oncology

## 2012-11-18 ENCOUNTER — Ambulatory Visit: Payer: Medicare Other

## 2012-11-23 ENCOUNTER — Encounter: Payer: Self-pay | Admitting: *Deleted

## 2012-11-23 NOTE — Progress Notes (Signed)
Faxed notification from [employer] that Temozolomide was shipped 11/22/12 for next day delivery.

## 2012-11-28 ENCOUNTER — Other Ambulatory Visit (HOSPITAL_BASED_OUTPATIENT_CLINIC_OR_DEPARTMENT_OTHER): Payer: Medicare Other | Admitting: Lab

## 2012-11-28 ENCOUNTER — Ambulatory Visit (HOSPITAL_BASED_OUTPATIENT_CLINIC_OR_DEPARTMENT_OTHER): Payer: Medicare Other

## 2012-11-28 VITALS — BP 120/84 | HR 92 | Temp 97.0°F | Resp 20

## 2012-11-28 DIAGNOSIS — C719 Malignant neoplasm of brain, unspecified: Secondary | ICD-10-CM

## 2012-11-28 DIAGNOSIS — C712 Malignant neoplasm of temporal lobe: Secondary | ICD-10-CM

## 2012-11-28 DIAGNOSIS — C711 Malignant neoplasm of frontal lobe: Secondary | ICD-10-CM

## 2012-11-28 DIAGNOSIS — Z5111 Encounter for antineoplastic chemotherapy: Secondary | ICD-10-CM

## 2012-11-28 DIAGNOSIS — C713 Malignant neoplasm of parietal lobe: Secondary | ICD-10-CM

## 2012-11-28 LAB — CBC WITH DIFFERENTIAL/PLATELET
Basophils Absolute: 0.1 10*3/uL (ref 0.0–0.1)
Eosinophils Absolute: 0.1 10*3/uL (ref 0.0–0.5)
HCT: 33.3 % — ABNORMAL LOW (ref 34.8–46.6)
HGB: 10.8 g/dL — ABNORMAL LOW (ref 11.6–15.9)
MONO#: 1.5 10*3/uL — ABNORMAL HIGH (ref 0.1–0.9)
NEUT%: 37.6 % — ABNORMAL LOW (ref 38.4–76.8)
WBC: 7 10*3/uL (ref 3.9–10.3)
lymph#: 2.7 10*3/uL (ref 0.9–3.3)

## 2012-11-28 LAB — BASIC METABOLIC PANEL (CC13)
BUN: 9 mg/dL (ref 7.0–26.0)
Chloride: 105 mEq/L (ref 98–107)
Creatinine: 0.6 mg/dL (ref 0.6–1.1)

## 2012-11-28 MED ORDER — ONDANSETRON 8 MG/50ML IVPB (CHCC)
8.0000 mg | Freq: Once | INTRAVENOUS | Status: AC
  Administered 2012-11-28: 8 mg via INTRAVENOUS

## 2012-11-28 MED ORDER — SODIUM CHLORIDE 0.9 % IV SOLN
Freq: Once | INTRAVENOUS | Status: AC
  Administered 2012-11-28: 15:00:00 via INTRAVENOUS

## 2012-11-28 MED ORDER — ATROPINE SULFATE 1 MG/ML IJ SOLN
0.5000 mg | Freq: Once | INTRAMUSCULAR | Status: AC | PRN
  Administered 2012-11-28: 0.5 mg via INTRAVENOUS

## 2012-11-28 MED ORDER — BEVACIZUMAB CHEMO INJECTION 400 MG/16ML
10.0000 mg/kg | Freq: Once | INTRAVENOUS | Status: AC
  Administered 2012-11-28: 650 mg via INTRAVENOUS
  Filled 2012-11-28: qty 26

## 2012-11-28 MED ORDER — IRINOTECAN HCL CHEMO INJECTION 100 MG/5ML
130.0000 mg/m2 | Freq: Once | INTRAVENOUS | Status: AC
  Administered 2012-11-28: 220 mg via INTRAVENOUS
  Filled 2012-11-28: qty 11

## 2012-11-28 NOTE — Progress Notes (Signed)
MD notified that pt c/o some dizziness, which is new, more with changing positions.  VSS.   MD states for pt to monitor, scan was stable 2 weeks ago, and call office if symptoms worsen.  Informed pt of this, and she and husband verbalize understanding.

## 2012-11-28 NOTE — Patient Instructions (Signed)
[facility] Discharge Instructions for Patients Receiving Chemotherapy  Today you received the following chemotherapy agents: avastin, irinotecan  To help prevent nausea and vomiting after your treatment, we encourage you to take your nausea medication.  Take it as often as prescribed.    If you develop nausea and vomiting that is not controlled by your nausea medication, call the clinic. If it is after clinic hours your family physician or the after hours number for the clinic or go to the Emergency Department.   BELOW ARE SYMPTOMS THAT SHOULD BE REPORTED IMMEDIATELY:  *FEVER GREATER THAN 100.5 F  *CHILLS WITH OR WITHOUT FEVER  NAUSEA AND VOMITING THAT IS NOT CONTROLLED WITH YOUR NAUSEA MEDICATION  *UNUSUAL SHORTNESS OF BREATH  *UNUSUAL BRUISING OR BLEEDING  TENDERNESS IN MOUTH AND THROAT WITH OR WITHOUT PRESENCE OF ULCERS  *URINARY PROBLEMS  *BOWEL PROBLEMS  UNUSUAL RASH Items with * indicate a potential emergency and should be followed up as soon as possible.  Feel free to call the clinic you have any questions or concerns. The clinic phone number is [phone].   I have been informed and understand all the instructions given to me. I know to contact the clinic, my physician, or go to the Emergency Department if any problems should occur. I do not have any questions at this time, but understand that I may call the clinic during office hours   should I have any questions or need assistance in obtaining follow up care.    __________________________________________  _____________  __________ Signature of Patient or Authorized Representative            Date                   Time    __________________________________________ Nurse's Signature

## 2012-11-29 ENCOUNTER — Other Ambulatory Visit: Payer: Self-pay | Admitting: Nurse Practitioner

## 2012-11-29 DIAGNOSIS — C712 Malignant neoplasm of temporal lobe: Secondary | ICD-10-CM

## 2012-11-29 DIAGNOSIS — R809 Proteinuria, unspecified: Secondary | ICD-10-CM

## 2012-11-29 LAB — CREATININE CLEARANCE, URINE, 24 HOUR
Collection Interval-CRCL: 24 hours
Creatinine, 24H Ur: 641 mg/d — ABNORMAL LOW (ref 700–1800)
Creatinine, Urine: 49.3 mg/dL
Creatinine: 0.51 mg/dL (ref 0.50–1.10)
Urine Total Volume-CRCL: 1300 mL

## 2012-11-29 LAB — PROTEIN, URINE, 24 HOUR: Protein, Urine: 44 mg/dL

## 2012-12-09 ENCOUNTER — Other Ambulatory Visit: Payer: Self-pay | Admitting: Oncology

## 2012-12-12 ENCOUNTER — Telehealth: Payer: Self-pay | Admitting: Oncology

## 2012-12-12 ENCOUNTER — Encounter: Payer: Self-pay | Admitting: Oncology

## 2012-12-12 ENCOUNTER — Telehealth: Payer: Self-pay | Admitting: *Deleted

## 2012-12-12 ENCOUNTER — Ambulatory Visit (HOSPITAL_BASED_OUTPATIENT_CLINIC_OR_DEPARTMENT_OTHER): Payer: Medicare Other | Admitting: Lab

## 2012-12-12 ENCOUNTER — Ambulatory Visit (HOSPITAL_BASED_OUTPATIENT_CLINIC_OR_DEPARTMENT_OTHER): Payer: Medicare Other

## 2012-12-12 ENCOUNTER — Ambulatory Visit (HOSPITAL_BASED_OUTPATIENT_CLINIC_OR_DEPARTMENT_OTHER): Payer: Medicare Other | Admitting: Oncology

## 2012-12-12 VITALS — BP 138/83 | HR 99 | Temp 96.9°F

## 2012-12-12 VITALS — BP 110/74 | HR 82 | Temp 97.7°F | Resp 20 | Ht 63.0 in | Wt 145.4 lb

## 2012-12-12 DIAGNOSIS — Z5111 Encounter for antineoplastic chemotherapy: Secondary | ICD-10-CM

## 2012-12-12 DIAGNOSIS — C712 Malignant neoplasm of temporal lobe: Secondary | ICD-10-CM

## 2012-12-12 DIAGNOSIS — C719 Malignant neoplasm of brain, unspecified: Secondary | ICD-10-CM

## 2012-12-12 DIAGNOSIS — Z5112 Encounter for antineoplastic immunotherapy: Secondary | ICD-10-CM

## 2012-12-12 LAB — CBC WITH DIFFERENTIAL/PLATELET
Basophils Absolute: 0 10*3/uL (ref 0.0–0.1)
EOS%: 3.8 % (ref 0.0–7.0)
Eosinophils Absolute: 0.3 10*3/uL (ref 0.0–0.5)
HCT: 34 % — ABNORMAL LOW (ref 34.8–46.6)
HGB: 11 g/dL — ABNORMAL LOW (ref 11.6–15.9)
LYMPH%: 30.4 % (ref 14.0–49.7)
MCH: 30.8 pg (ref 25.1–34.0)
MCV: 95.2 fL (ref 79.5–101.0)
MONO%: 16.2 % — ABNORMAL HIGH (ref 0.0–14.0)
NEUT#: 4.1 10*3/uL (ref 1.5–6.5)
NEUT%: 49.2 % (ref 38.4–76.8)
Platelets: 481 10*3/uL — ABNORMAL HIGH (ref 145–400)
RDW: 19.2 % — ABNORMAL HIGH (ref 11.2–14.5)

## 2012-12-12 LAB — COMPREHENSIVE METABOLIC PANEL (CC13)
ALT: 18 U/L (ref 0–55)
BUN: 9.4 mg/dL (ref 7.0–26.0)
CO2: 22 mEq/L (ref 22–29)
Calcium: 10 mg/dL (ref 8.4–10.4)
Chloride: 103 mEq/L (ref 98–107)
Creatinine: 0.6 mg/dL (ref 0.6–1.1)
Glucose: 90 mg/dl (ref 70–99)
Total Bilirubin: 0.35 mg/dL (ref 0.20–1.20)

## 2012-12-12 LAB — UA PROTEIN, DIPSTICK - CHCC: Protein, ur: 30 mg/dL

## 2012-12-12 MED ORDER — IRINOTECAN HCL CHEMO INJECTION 100 MG/5ML
130.0000 mg/m2 | Freq: Once | INTRAVENOUS | Status: AC
  Administered 2012-12-12: 220 mg via INTRAVENOUS
  Filled 2012-12-12: qty 11

## 2012-12-12 MED ORDER — SODIUM CHLORIDE 0.9 % IV SOLN
10.0000 mg/kg | Freq: Once | INTRAVENOUS | Status: AC
  Administered 2012-12-12: 650 mg via INTRAVENOUS
  Filled 2012-12-12: qty 26

## 2012-12-12 MED ORDER — ONDANSETRON 8 MG/50ML IVPB (CHCC)
8.0000 mg | Freq: Once | INTRAVENOUS | Status: AC
  Administered 2012-12-12: 8 mg via INTRAVENOUS

## 2012-12-12 MED ORDER — SODIUM CHLORIDE 0.9 % IV SOLN
Freq: Once | INTRAVENOUS | Status: AC
  Administered 2012-12-12: 16:00:00 via INTRAVENOUS

## 2012-12-12 MED ORDER — ATROPINE SULFATE 1 MG/ML IJ SOLN
0.5000 mg | Freq: Once | INTRAMUSCULAR | Status: AC | PRN
  Administered 2012-12-12: 0.5 mg via INTRAVENOUS

## 2012-12-12 NOTE — Patient Instructions (Signed)
[facility] Discharge Instructions for Patients Receiving Chemotherapy  Today you received the following chemotherapy agents : avastin & camptosar  To help prevent nausea and vomiting after your treatment, we encourage you to take your nausea medication: Zofran Begin taking it at 1st sign of nausea and take it as often as prescribed.   If you develop nausea and vomiting that is not controlled by your nausea medication, call the clinic. If it is after clinic hours your family physician or the after hours number for the clinic or go to the Emergency Department.   BELOW ARE SYMPTOMS THAT SHOULD BE REPORTED IMMEDIATELY:  *FEVER GREATER THAN 100.5 F  *CHILLS WITH OR WITHOUT FEVER  NAUSEA AND VOMITING THAT IS NOT CONTROLLED WITH YOUR NAUSEA MEDICATION  *UNUSUAL SHORTNESS OF BREATH  *UNUSUAL BRUISING OR BLEEDING  TENDERNESS IN MOUTH AND THROAT WITH OR WITHOUT PRESENCE OF ULCERS  *URINARY PROBLEMS  *BOWEL PROBLEMS  UNUSUAL RASH Items with * indicate a potential emergency and should be followed up as soon as possible.   Feel free to call the clinic you have any questions or concerns. The clinic phone number is [phone].   I have been informed and understand all the instructions given to me. I know to contact the clinic, my physician, or go to the Emergency Department if any problems should occur. I do not have any questions at this time, but understand that I may call the clinic during office hours   should I have any questions or need assistance in obtaining follow up care.    __________________________________________  _____________  __________ Signature of Patient or Authorized Representative            Date                   Time    __________________________________________ Nurse's Signature

## 2012-12-12 NOTE — Progress Notes (Signed)
Put husband's fmla form on nurse's desk. °

## 2012-12-12 NOTE — Telephone Encounter (Signed)
Gave pt appt for lab q week per POF, chemo every 2 weeks, pt will see Misty Stanley on 01/10/12 with labs

## 2012-12-12 NOTE — Progress Notes (Signed)
Faxed husband's fmla form to [facility] [phone].

## 2012-12-12 NOTE — Telephone Encounter (Signed)
Per staff phone call and POF I have schedueld appts.  JMW  

## 2012-12-13 NOTE — Progress Notes (Signed)
Hematology and Oncology Follow Up Visit  Kynzee Devinney [id number] 1961/10/21 52 y.o. 12/13/2012 6:07 PM   Principle Diagnosis: Encounter Diagnosis  Name Primary?  . Glioblastoma multiforme of temporal lobe Yes     Interim History:    Followup visit for this 52 year old woman under active treatment for a second progression of glioblastoma multiforme. Her maintenance regimen of monthly Avastin was modified back to a every two-week schedule and Irinotecan 125 mg per meter squared every [redacted] weeks along with Temodar 80 mg by mouth daily was added starting on 09/05/2012. She has  tolerated the chemotherapy very well. She has had no nausea or vomiting. She is still getting some diarrhea up to 4 or 5 bowel movements daily for the first few days after the Camptosar. She is using Imodium daily with good results.  She continues to have problems with vision and unsteady gait related to poor vision. She was initially put back on  Decadron by [facility] neuro-oncology but she did not improve so she was tapered off.   Although she still appears to be mentating well, there are subtle changes, primarily related to short-term memory loss recognized by her husband who is constantly at her side. Gait remains unsteady and she tends to fall to the left. Urine monitoring on Avastin showed that she spilled 3+ protein. A 24-hour urine collection with 590 mg of total protein. This is within acceptable criteria to continue the drug. Urine today with just 1+ protein. I have been amazed that she is getting no myelosuppression from the chemotherapy drugs. She is getting increased fatigue. She now has near-total alopecia.   Medications: reviewed  Allergies:  Allergies  Allergen Reactions  . Co-Trimoxazole Injection (Sulfamethoxazole-Trimethoprim) Hives  . Pentamidine     Hypotension & weakness    Review of Systems: Constitutional:    progressive fatigue on chemotherapy  Respiratory: no cough or dyspnea    Cardiovascular:   no chest pain or palpitations  Gastrointestinal: no abdominal pain. Loose bowel movements associated with chemotherapy  Genito-Urinary:  no urinary tract symptoms Musculoskeletal: no muscle or bone pain  Neurologic: see above  Skin: no rash or ecchymosis  Remaining ROS negative.  Physical Exam: Blood pressure 110/74, pulse 82, temperature 97.7 F (36.5 C), temperature source Oral, resp. rate 20, height 5\' 3"  (1.6 m), weight 145 lb 6.4 oz (65.953 kg). Wt Readings from Last 3 Encounters:  12/12/12 145 lb 6.4 oz (65.953 kg)  11/04/12 148 lb 6.4 oz (67.314 kg)  10/17/12 147 lb (66.679 kg)     General appearance:  Caucasian woman  [name]:  pharynx no erythema or exudate Lymph nodes:  no adenopathy  Breasts: Lungs: clear to auscultation resonant to percussion  Heart: regular rhythm no murmur  Abdomen: soft, nontender, no mass, no organomegaly Extremities: no edema (resolved off Decadron), no calf tenderness  Vascular: No cyanosis Neurologic: she is alert, oriented, and cooperative. Motor strength is 5 over 5. Reflexes 2+ symmetric. Left hand remains clumsy on finger to finger and rapid alternating movements but no change from prior exam. Perla. Optic disc sharp and vessels normal. Formal visual testing not done. Cranial nerves appear grossly intact otherwise. Vibration intact to tuning fork exam over the fingertips.  Skin: no rash or ecchymosis   Lab Results: Lab Results  Component Value Date   WBC 8.2 12/12/2012   HGB 11.0* 12/12/2012   HCT 34.0* 12/12/2012   MCV 95.2 12/12/2012   PLT 481* 12/12/2012     Chemistry  Component Value Date/Time   NA 135* 12/12/2012 1356   NA 133* 06/13/2012 1145   NA 138 03/20/2009 1454   K 4.0 12/12/2012 1356   K 4.0 06/13/2012 1145   K 3.8 03/20/2009 1454   CL 103 12/12/2012 1356   CL 95* 06/13/2012 1145   CL 100 03/20/2009 1454   CO2 22 12/12/2012 1356   CO2 27 06/13/2012 1145   CO2 27 03/20/2009 1454   BUN 9.4 12/12/2012 1356   BUN  14 06/13/2012 1145   BUN 18 03/20/2009 1454   CREATININE 0.6 12/12/2012 1356   CREATININE 0.51 11/28/2012 1401   CREATININE 0.53 06/13/2012 1145   CREATININE 0.6 03/20/2009 1454      Component Value Date/Time   CALCIUM 10.0 12/12/2012 1356   CALCIUM 10.1 06/13/2012 1145   CALCIUM 8.6 03/20/2009 1454   ALKPHOS 75 12/12/2012 1356   ALKPHOS 56 06/13/2012 1145   ALKPHOS 65 03/20/2009 1454   AST 19 12/12/2012 1356   AST 22 06/13/2012 1145   AST 14 03/20/2009 1454   ALT 18 12/12/2012 1356   ALT 19 06/13/2012 1145   BILITOT 0.35 12/12/2012 1356   BILITOT 0.3 06/13/2012 1145   BILITOT 0.50 03/20/2009 1454       Radiological Studies: No results found.  Impression and Plan: #1. 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 Initial diagnosis April 2010. First progression December 2010. Second progression October 2013. She had stable disease on most recent MRI scan done at [facility] on December 19. She remains clinically stable on a combination of every two-week Avastin plus Camptosar plus low dose daily Temodar. She is due for a followup MRI at [facility] this week. We will continue her current regimen pending that evaluation.  #2. Intermittent proteinuria related to Avastin.  #3. History of pulmonary embolus shortly following initial diagnosis in April 1941 complicated by concomitant bilateral interstitial pulmonary infiltrates treated empirically for pneumocystis. She was kept on full dose anticoagulation until we started the more intensified chemotherapy program on 10/03/2012.   CC:.    Annia Belt, MD 1/28/20146:07 PM

## 2012-12-15 ENCOUNTER — Telehealth: Payer: Self-pay | Admitting: Oncology

## 2012-12-15 NOTE — Telephone Encounter (Signed)
Called pt and left message regarding lab , will be drawn on day of treatment

## 2012-12-19 ENCOUNTER — Other Ambulatory Visit: Payer: Medicare Other

## 2012-12-20 ENCOUNTER — Encounter: Payer: Self-pay | Admitting: *Deleted

## 2012-12-20 NOTE — Progress Notes (Signed)
RECEIVED A FAX FROM [employer] CONCERNING A CONFIRMATION OF PRESCRIPTION SHIPMENT FOR TEMOZOLOMIDE ON 12/19/12.

## 2012-12-22 NOTE — Progress Notes (Signed)
Note for tmt given on 1/27hr, CPT started at 1721hr, order to be given over . Pt. Discharged at 1851, CPT given over 1 hr and . HL

## 2012-12-26 ENCOUNTER — Other Ambulatory Visit (HOSPITAL_BASED_OUTPATIENT_CLINIC_OR_DEPARTMENT_OTHER): Payer: Medicare Other | Admitting: Lab

## 2012-12-26 ENCOUNTER — Other Ambulatory Visit: Payer: Self-pay | Admitting: Oncology

## 2012-12-26 ENCOUNTER — Ambulatory Visit (HOSPITAL_BASED_OUTPATIENT_CLINIC_OR_DEPARTMENT_OTHER): Payer: Medicare Other

## 2012-12-26 VITALS — BP 132/87 | HR 88 | Temp 97.1°F | Resp 18

## 2012-12-26 DIAGNOSIS — C719 Malignant neoplasm of brain, unspecified: Secondary | ICD-10-CM

## 2012-12-26 DIAGNOSIS — Z5111 Encounter for antineoplastic chemotherapy: Secondary | ICD-10-CM

## 2012-12-26 DIAGNOSIS — C712 Malignant neoplasm of temporal lobe: Secondary | ICD-10-CM

## 2012-12-26 DIAGNOSIS — Z7901 Long term (current) use of anticoagulants: Secondary | ICD-10-CM

## 2012-12-26 DIAGNOSIS — Z5112 Encounter for antineoplastic immunotherapy: Secondary | ICD-10-CM

## 2012-12-26 DIAGNOSIS — I2699 Other pulmonary embolism without acute cor pulmonale: Secondary | ICD-10-CM

## 2012-12-26 LAB — CBC WITH DIFFERENTIAL/PLATELET
BASO%: 0.3 % (ref 0.0–2.0)
EOS%: 3.6 % (ref 0.0–7.0)
MCH: 30.6 pg (ref 25.1–34.0)
MCHC: 31.9 g/dL (ref 31.5–36.0)
RBC: 3.53 10*6/uL — ABNORMAL LOW (ref 3.70–5.45)
RDW: 17.8 % — ABNORMAL HIGH (ref 11.2–14.5)
lymph#: 1.9 10*3/uL (ref 0.9–3.3)

## 2012-12-26 MED ORDER — SODIUM CHLORIDE 0.9 % IV SOLN
Freq: Once | INTRAVENOUS | Status: AC
  Administered 2012-12-26: 15:00:00 via INTRAVENOUS

## 2012-12-26 MED ORDER — ATROPINE SULFATE 1 MG/ML IJ SOLN
0.5000 mg | Freq: Once | INTRAMUSCULAR | Status: AC | PRN
  Administered 2012-12-26: 0.5 mg via INTRAVENOUS

## 2012-12-26 MED ORDER — ONDANSETRON 8 MG/50ML IVPB (CHCC)
8.0000 mg | Freq: Once | INTRAVENOUS | Status: AC
  Administered 2012-12-26: 8 mg via INTRAVENOUS

## 2012-12-26 MED ORDER — IRINOTECAN HCL CHEMO INJECTION 100 MG/5ML
130.0000 mg/m2 | Freq: Once | INTRAVENOUS | Status: AC
  Administered 2012-12-26: 220 mg via INTRAVENOUS
  Filled 2012-12-26: qty 11

## 2012-12-26 MED ORDER — SODIUM CHLORIDE 0.9 % IV SOLN
10.0000 mg/kg | Freq: Once | INTRAVENOUS | Status: AC
  Administered 2012-12-26: 650 mg via INTRAVENOUS
  Filled 2012-12-26: qty 26

## 2012-12-26 NOTE — Patient Instructions (Signed)
[facility] Discharge Instructions for Patients Receiving Chemotherapy  Today you received the following chemotherapy agents Avastin and Irinotecan.  To help prevent nausea and vomiting after your treatment, we encourage you to take your nausea medication. Begin taking your nausea medication as often as prescribed for by Dr. Cyndie Chime.    If you develop nausea and vomiting that is not controlled by your nausea medication, call the clinic. If it is after clinic hours your family physician or the after hours number for the clinic or go to the Emergency Department.   BELOW ARE SYMPTOMS THAT SHOULD BE REPORTED IMMEDIATELY:  *FEVER GREATER THAN 100.5 F  *CHILLS WITH OR WITHOUT FEVER  NAUSEA AND VOMITING THAT IS NOT CONTROLLED WITH YOUR NAUSEA MEDICATION  *UNUSUAL SHORTNESS OF BREATH  *UNUSUAL BRUISING OR BLEEDING  TENDERNESS IN MOUTH AND THROAT WITH OR WITHOUT PRESENCE OF ULCERS  *URINARY PROBLEMS  *BOWEL PROBLEMS  UNUSUAL RASH Items with * indicate a potential emergency and should be followed up as soon as possible.  One of the nurses will contact you 24 hours after your treatment. Please let the nurse know about any problems that you may have experienced. Feel free to call the clinic you have any questions or concerns. The clinic phone number is [phone].   I have been informed and understand all the instructions given to me. I know to contact the clinic, my physician, or go to the Emergency Department if any problems should occur. I do not have any questions at this time, but understand that I may call the clinic during office hours   should I have any questions or need assistance in obtaining follow up care.    __________________________________________  _____________  __________ Signature of Patient or Authorized Representative            Date                   Time    __________________________________________ Nurse's Signature

## 2013-01-02 ENCOUNTER — Other Ambulatory Visit: Payer: Medicare Other

## 2013-01-09 ENCOUNTER — Telehealth: Payer: Self-pay | Admitting: Oncology

## 2013-01-09 ENCOUNTER — Telehealth: Payer: Self-pay | Admitting: *Deleted

## 2013-01-09 ENCOUNTER — Ambulatory Visit: Payer: Medicare Other | Admitting: Nurse Practitioner

## 2013-01-09 ENCOUNTER — Ambulatory Visit (HOSPITAL_BASED_OUTPATIENT_CLINIC_OR_DEPARTMENT_OTHER): Payer: Medicare Other

## 2013-01-09 ENCOUNTER — Ambulatory Visit (HOSPITAL_BASED_OUTPATIENT_CLINIC_OR_DEPARTMENT_OTHER): Payer: Medicare Other | Admitting: Nurse Practitioner

## 2013-01-09 ENCOUNTER — Other Ambulatory Visit (HOSPITAL_BASED_OUTPATIENT_CLINIC_OR_DEPARTMENT_OTHER): Payer: Medicare Other | Admitting: Lab

## 2013-01-09 VITALS — BP 126/84 | HR 85 | Temp 96.8°F | Resp 20 | Ht 63.0 in | Wt 142.4 lb

## 2013-01-09 DIAGNOSIS — I2699 Other pulmonary embolism without acute cor pulmonale: Secondary | ICD-10-CM

## 2013-01-09 DIAGNOSIS — C719 Malignant neoplasm of brain, unspecified: Secondary | ICD-10-CM

## 2013-01-09 DIAGNOSIS — C712 Malignant neoplasm of temporal lobe: Secondary | ICD-10-CM

## 2013-01-09 DIAGNOSIS — Z5112 Encounter for antineoplastic immunotherapy: Secondary | ICD-10-CM

## 2013-01-09 DIAGNOSIS — Z5111 Encounter for antineoplastic chemotherapy: Secondary | ICD-10-CM

## 2013-01-09 DIAGNOSIS — Z86711 Personal history of pulmonary embolism: Secondary | ICD-10-CM

## 2013-01-09 DIAGNOSIS — Z7901 Long term (current) use of anticoagulants: Secondary | ICD-10-CM

## 2013-01-09 LAB — COMPREHENSIVE METABOLIC PANEL (CC13)
Alkaline Phosphatase: 55 U/L (ref 40–150)
BUN: 10.2 mg/dL (ref 7.0–26.0)
CO2: 23 mEq/L (ref 22–29)
Creatinine: 0.6 mg/dL (ref 0.6–1.1)
Glucose: 104 mg/dl — ABNORMAL HIGH (ref 70–99)
Sodium: 135 mEq/L — ABNORMAL LOW (ref 136–145)
Total Bilirubin: 0.2 mg/dL (ref 0.20–1.20)
Total Protein: 7 g/dL (ref 6.4–8.3)

## 2013-01-09 LAB — CBC WITH DIFFERENTIAL/PLATELET
Basophils Absolute: 0 10*3/uL (ref 0.0–0.1)
EOS%: 2.1 % (ref 0.0–7.0)
Eosinophils Absolute: 0.1 10*3/uL (ref 0.0–0.5)
HGB: 11 g/dL — ABNORMAL LOW (ref 11.6–15.9)
LYMPH%: 25.6 % (ref 14.0–49.7)
MCH: 30.6 pg (ref 25.1–34.0)
MCV: 96.4 fL (ref 79.5–101.0)
MONO%: 17.6 % — ABNORMAL HIGH (ref 0.0–14.0)
NEUT#: 3.1 10*3/uL (ref 1.5–6.5)
NEUT%: 54.4 % (ref 38.4–76.8)
Platelets: 321 10*3/uL (ref 145–400)
RDW: 16.5 % — ABNORMAL HIGH (ref 11.2–14.5)

## 2013-01-09 LAB — UA PROTEIN, DIPSTICK - CHCC: Protein, ur: 30 mg/dL

## 2013-01-09 MED ORDER — SODIUM CHLORIDE 0.9 % IV SOLN
10.0000 mg/kg | Freq: Once | INTRAVENOUS | Status: AC
  Administered 2013-01-09: 650 mg via INTRAVENOUS
  Filled 2013-01-09: qty 26

## 2013-01-09 MED ORDER — ONDANSETRON 8 MG/50ML IVPB (CHCC)
8.0000 mg | Freq: Once | INTRAVENOUS | Status: AC
Start: 2013-01-09 — End: 2013-01-09
  Administered 2013-01-09: 8 mg via INTRAVENOUS

## 2013-01-09 MED ORDER — ATROPINE SULFATE 1 MG/ML IJ SOLN
0.5000 mg | Freq: Once | INTRAMUSCULAR | Status: AC | PRN
  Administered 2013-01-09: 16:00:00 via INTRAVENOUS

## 2013-01-09 MED ORDER — SODIUM CHLORIDE 0.9 % IV SOLN
Freq: Once | INTRAVENOUS | Status: AC
  Administered 2013-01-09: 15:00:00 via INTRAVENOUS

## 2013-01-09 MED ORDER — IRINOTECAN HCL CHEMO INJECTION 100 MG/5ML
130.0000 mg/m2 | Freq: Once | INTRAVENOUS | Status: AC
  Administered 2013-01-09: 220 mg via INTRAVENOUS
  Filled 2013-01-09: qty 11

## 2013-01-09 NOTE — Progress Notes (Signed)
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 She underwent debulking surgery. She was initially treated on a clinical trial of Temodar, irinotecan and Avastin. Course was complicated by bilateral pulmonary emboli and diffuse bilateral interstitial pulmonary infiltrates, suspected pneumocystis pneumonia in April 2010. She was subsequently treated off protocol with cranial radiation and concomitant low-dose oral Temodar 06/05/2009 through 07/16/2009. She was then treated with maintenance Temodar 200 mg per meter square for 5 days each month. There was evidence of early progression while on Temodar in December 2010. Avastin was resumed at that time with disease stabilization. She completed 12 cycles of adjuvant Temodar plus Avastin 08/11/2010. She then began maintenance Avastin 15 mg per kilogram every 4 weeks.  Followup MRI of the brain done at [facility] on 08/29/2012 showed signs of progression. It was recommended to start her back on Temodar 80 mg daily and to increase Avastin back to 10 mg per kilogram IV every 2 weeks. She was treated with Avastin on 09/05/2012 and 09/19/2012.  We received a call from [facility] on 09/22/2012 that the tumor was growing and they advised adding irinotecan 125 mg per meter squared with the next Avastin. Irinotecan was added on a 2 week schedule beginning 10/03/2012. Stable disease on MRI scan done at [facility] on 11/03/2012.  She presents today for scheduled followup. She continues to have visual problems. No headaches. She had a recent near fall. Gait is unsteady at times due to the visual disturbance. No seizures. No headaches. No nausea or vomiting related to chemotherapy. She has intermittent loose stools. She takes Imodium periodically. No mouth sores. No skin rash. She denies bleeding. No shortness of breath or chest  pain. No leg swelling or calf pain. Husband reports previous leg swelling resolved coinciding with discontinuation of dexamethasone.  They report a recent MRI of the brain was stable.   Objective: Blood pressure 126/84, pulse 85, temperature 96.8 F (36 C), temperature source Oral, resp. rate 20, height 5\' 3"  (1.6 m), weight 142 lb 6.4 oz (64.592 kg).  Pupils are equal, round and reactive to light. Oropharynx is without thrush or ulceration. No palpable cervical or supraclavicular lymph nodes. Lungs are clear. No wheezes or rales. Regular cardiac rhythm. Abdomen is soft and nontender. No organomegaly. Extremities are without edema. Motor strength 5 over 5. She is alert and oriented. Follows commands.  Lab Results: Lab Results  Component Value Date   WBC 5.7 01/09/2013   HGB 11.0* 01/09/2013   HCT 34.7* 01/09/2013   MCV 96.4 01/09/2013   PLT 321 01/09/2013    Chemistry:    Chemistry      Component Value Date/Time   NA 135* 12/12/2012 1356   NA 133* 06/13/2012 1145   NA 138 03/20/2009 1454   K 4.0 12/12/2012 1356   K 4.0 06/13/2012 1145   K 3.8 03/20/2009 1454   CL 103 12/12/2012 1356   CL 95* 06/13/2012 1145   CL 100 03/20/2009 1454   CO2 22 12/12/2012 1356   CO2 27 06/13/2012 1145   CO2 27 03/20/2009 1454   BUN 9.4 12/12/2012 1356   BUN 14 06/13/2012 1145   BUN 18 03/20/2009 1454   CREATININE 0.6 12/12/2012 1356   CREATININE 0.51 11/28/2012 1401   CREATININE 0.53 06/13/2012 1145   CREATININE 0.6 03/20/2009 1454      Component Value  Date/Time   CALCIUM 10.0 12/12/2012 1356   CALCIUM 10.1 06/13/2012 1145   CALCIUM 8.6 03/20/2009 1454   ALKPHOS 75 12/12/2012 1356   ALKPHOS 56 06/13/2012 1145   ALKPHOS 65 03/20/2009 1454   AST 19 12/12/2012 1356   AST 22 06/13/2012 1145   AST 14 03/20/2009 1454   ALT 18 12/12/2012 1356   ALT 19 06/13/2012 1145   BILITOT 0.35 12/12/2012 1356   BILITOT 0.3 06/13/2012 1145   BILITOT 0.50 03/20/2009 1454       Studies/Results: No results found.  Medications: I have  reviewed the patient's current medications.  Assessment/Plan:  1. 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 She underwent debulking surgery. She was initially treated on a clinical trial with Temodar, irinotecan and Avastin. Course was complicated by development of bilateral pulmonary emboli and diffuse bilateral interstitial pulmonary infiltrates and suspected pneumocystis pneumonia April 2010. She was subsequently treated off protocol with cranial radiation with concomitant low-dose oral Temodar 07/21 through 07/16/2009. Subsequent maintenance Temodar 200 mg per meter squared 5 days each month. There was early progression while on Temodar December 2010. Avastin was added back at that time with disease stabilization. She completed 12 cycles of adjuvant Temodar plus Avastin 08/11/2010. Maintenance Avastin continued every 4 weeks. MRI of the brain at [facility] on 05/26/2012 with no evidence of disease progression. MRI of the brain at [facility] on 08/29/2012 with evidence of disease progression. Temodar resumed at a daily dose and Avastin change to 10 mg per kilogram IV every 2 weeks. Irinotecan added on a 2 week schedule beginning 10/03/2012. Restaging brain MRI 11/03/2012 with stable disease. 2. Bilateral pulmonary emboli occurring in July 2010. Coumadin was discontinued following an office visit 10/17/2012. 3. History of bilateral interstitial pneumonia occurring around the time of initial diagnosis of GBM. She was treated empirically for Pneumocystis. 4. Asymmetric leg edema 11/04/2012. Bilateral venous Doppler negative for DVT.   Disposition-Mercedes Barnes appears stable. Plan to continue every 2 week Avastin/Camptosar and low-dose daily Temodar. Next restaging study is scheduled 02/09/2013 at [facility]. We will continue to see her on a monthly schedule. She or her husband will contact  the office prior to the next visit with any problems.  Ned Card ANP/GNP-BC

## 2013-01-09 NOTE — Patient Instructions (Addendum)
[facility] Discharge Instructions for Patients Receiving Chemotherapy  Today you received the following chemotherapy agents Camptosar and Avastin To help prevent nausea and vomiting after your treatment, we encourage you to take your nausea medication   Take it as often as prescribed.   If you develop nausea and vomiting that is not controlled by your nausea medication, call the clinic. If it is after clinic hours your family physician or the after hours number for the clinic or go to the Emergency Department.   BELOW ARE SYMPTOMS THAT SHOULD BE REPORTED IMMEDIATELY:  *FEVER GREATER THAN 100.5 F  *CHILLS WITH OR WITHOUT FEVER  NAUSEA AND VOMITING THAT IS NOT CONTROLLED WITH YOUR NAUSEA MEDICATION  *UNUSUAL SHORTNESS OF BREATH  *UNUSUAL BRUISING OR BLEEDING  TENDERNESS IN MOUTH AND THROAT WITH OR WITHOUT PRESENCE OF ULCERS  *URINARY PROBLEMS  *BOWEL PROBLEMS  UNUSUAL RASH Items with * indicate a potential emergency and should be followed up as soon as possible.  Feel free to call the clinic you have any questions or concerns. The clinic phone number is [phone].

## 2013-01-09 NOTE — Telephone Encounter (Signed)
Per staff message and POF I have scheduled appts.  JMW  

## 2013-01-09 NOTE — Telephone Encounter (Signed)
gv and pritned appt schedule for pt husband s.w and emailed michelle to add tx.Marland KitchenMarland Kitchen

## 2013-01-13 ENCOUNTER — Other Ambulatory Visit: Payer: Self-pay | Admitting: *Deleted

## 2013-01-13 DIAGNOSIS — C712 Malignant neoplasm of temporal lobe: Secondary | ICD-10-CM

## 2013-01-13 MED ORDER — TEMOZOLOMIDE 20 MG PO CAPS: 80.0000 mg | ORAL_CAPSULE | Freq: Every day | ORAL | Status: DC

## 2013-01-13 NOTE — Telephone Encounter (Signed)
THIS REFILL REQUEST FOR TEMOZOLOMIDE WAS PLACED IN DR.GRANFORTUNA'S ACTIVE WORK BOX.

## 2013-01-13 NOTE — Telephone Encounter (Signed)
RECEIVED A FAX FROM [facility] CONCERNING A CONFIRMATION OF FACSIMILE RECEIPT FOR PT.'S REFERRAL. 

## 2013-01-13 NOTE — Addendum Note (Signed)
Addended by: Arvilla Meres on: 01/13/2013 01:49 PM   Modules accepted: Orders

## 2013-01-16 ENCOUNTER — Other Ambulatory Visit: Payer: Medicare Other

## 2013-01-17 ENCOUNTER — Other Ambulatory Visit: Payer: Self-pay | Admitting: *Deleted

## 2013-01-17 DIAGNOSIS — C712 Malignant neoplasm of temporal lobe: Secondary | ICD-10-CM

## 2013-01-17 MED ORDER — TEMOZOLOMIDE 20 MG PO CAPS: 80.0000 mg | ORAL_CAPSULE | Freq: Every day | ORAL | Status: DC

## 2013-01-19 ENCOUNTER — Other Ambulatory Visit: Payer: Self-pay | Admitting: Oncology

## 2013-01-19 NOTE — Telephone Encounter (Signed)
RECEIVED A FAX FROM [facility] CONCERNING A CONFIRMATION OF PRESCRIPTION SHIPMENT FOR TEMOZOLOMIDE ON 01/18/13.

## 2013-01-23 ENCOUNTER — Other Ambulatory Visit (HOSPITAL_BASED_OUTPATIENT_CLINIC_OR_DEPARTMENT_OTHER): Payer: Medicare Other | Admitting: Lab

## 2013-01-23 ENCOUNTER — Ambulatory Visit (HOSPITAL_BASED_OUTPATIENT_CLINIC_OR_DEPARTMENT_OTHER): Payer: Medicare Other

## 2013-01-23 VITALS — BP 142/95 | HR 88 | Temp 98.0°F | Resp 20

## 2013-01-23 DIAGNOSIS — C719 Malignant neoplasm of brain, unspecified: Secondary | ICD-10-CM

## 2013-01-23 DIAGNOSIS — Z7901 Long term (current) use of anticoagulants: Secondary | ICD-10-CM

## 2013-01-23 DIAGNOSIS — Z5111 Encounter for antineoplastic chemotherapy: Secondary | ICD-10-CM

## 2013-01-23 DIAGNOSIS — C712 Malignant neoplasm of temporal lobe: Secondary | ICD-10-CM

## 2013-01-23 DIAGNOSIS — Z5112 Encounter for antineoplastic immunotherapy: Secondary | ICD-10-CM

## 2013-01-23 DIAGNOSIS — I2699 Other pulmonary embolism without acute cor pulmonale: Secondary | ICD-10-CM

## 2013-01-23 LAB — CBC WITH DIFFERENTIAL/PLATELET
BASO%: 0.2 % (ref 0.0–2.0)
Eosinophils Absolute: 0.2 10*3/uL (ref 0.0–0.5)
HCT: 37.3 % (ref 34.8–46.6)
HGB: 12 g/dL (ref 11.6–15.9)
LYMPH%: 28.3 % (ref 14.0–49.7)
MONO#: 1 10*3/uL — ABNORMAL HIGH (ref 0.1–0.9)
NEUT#: 3.5 10*3/uL (ref 1.5–6.5)
NEUT%: 53.7 % (ref 38.4–76.8)
Platelets: 271 10*3/uL (ref 145–400)
WBC: 6.5 10*3/uL (ref 3.9–10.3)
lymph#: 1.8 10*3/uL (ref 0.9–3.3)

## 2013-01-23 LAB — COMPREHENSIVE METABOLIC PANEL (CC13)
AST: 16 U/L (ref 5–34)
Albumin: 3.5 g/dL (ref 3.5–5.0)
Alkaline Phosphatase: 61 U/L (ref 40–150)
BUN: 11.9 mg/dL (ref 7.0–26.0)
Calcium: 9.2 mg/dL (ref 8.4–10.4)
Chloride: 102 mEq/L (ref 98–107)
Glucose: 91 mg/dl (ref 70–99)
Potassium: 4 mEq/L (ref 3.5–5.1)
Sodium: 133 mEq/L — ABNORMAL LOW (ref 136–145)
Total Protein: 6.8 g/dL (ref 6.4–8.3)

## 2013-01-23 MED ORDER — SODIUM CHLORIDE 0.9 % IV SOLN
Freq: Once | INTRAVENOUS | Status: AC
  Administered 2013-01-23: 20 mL via INTRAVENOUS

## 2013-01-23 MED ORDER — ONDANSETRON 8 MG/50ML IVPB (CHCC)
8.0000 mg | Freq: Once | INTRAVENOUS | Status: AC
  Administered 2013-01-23: 8 mg via INTRAVENOUS

## 2013-01-23 MED ORDER — ATROPINE SULFATE 0.4 MG/ML IJ SOLN
0.5000 mg | Freq: Once | INTRAMUSCULAR | Status: AC | PRN
  Administered 2013-01-23: 0.5 mg via INTRAVENOUS
  Filled 2013-01-23: qty 1.25

## 2013-01-23 MED ORDER — IRINOTECAN HCL CHEMO INJECTION 100 MG/5ML
130.0000 mg/m2 | Freq: Once | INTRAVENOUS | Status: AC
  Administered 2013-01-23: 220 mg via INTRAVENOUS
  Filled 2013-01-23: qty 11

## 2013-01-23 MED ORDER — SODIUM CHLORIDE 0.9 % IV SOLN
10.0000 mg/kg | Freq: Once | INTRAVENOUS | Status: AC
  Administered 2013-01-23: 650 mg via INTRAVENOUS
  Filled 2013-01-23: qty 26

## 2013-02-06 ENCOUNTER — Other Ambulatory Visit (HOSPITAL_COMMUNITY): Payer: Self-pay | Admitting: Oncology

## 2013-02-06 ENCOUNTER — Ambulatory Visit (HOSPITAL_BASED_OUTPATIENT_CLINIC_OR_DEPARTMENT_OTHER): Payer: Medicare Other | Admitting: Nurse Practitioner

## 2013-02-06 ENCOUNTER — Ambulatory Visit (HOSPITAL_COMMUNITY)
Admission: RE | Admit: 2013-02-06 | Discharge: 2013-02-06 | Disposition: A | Discharge status: Home / Self Care | Payer: Medicare Other | Source: Ambulatory Visit | Attending: Oncology | Admitting: Oncology

## 2013-02-06 ENCOUNTER — Ambulatory Visit (HOSPITAL_BASED_OUTPATIENT_CLINIC_OR_DEPARTMENT_OTHER): Payer: Medicare Other

## 2013-02-06 ENCOUNTER — Other Ambulatory Visit (HOSPITAL_BASED_OUTPATIENT_CLINIC_OR_DEPARTMENT_OTHER): Payer: Medicare Other | Admitting: Lab

## 2013-02-06 VITALS — BP 135/94 | HR 76 | Temp 97.0°F | Resp 18 | Ht 63.0 in | Wt 138.6 lb

## 2013-02-06 VITALS — BP 135/72 | HR 85

## 2013-02-06 DIAGNOSIS — M25562 Pain in left knee: Secondary | ICD-10-CM

## 2013-02-06 DIAGNOSIS — M79609 Pain in unspecified limb: Secondary | ICD-10-CM

## 2013-02-06 DIAGNOSIS — R609 Edema, unspecified: Secondary | ICD-10-CM | POA: Insufficient documentation

## 2013-02-06 DIAGNOSIS — C711 Malignant neoplasm of frontal lobe: Secondary | ICD-10-CM

## 2013-02-06 DIAGNOSIS — C713 Malignant neoplasm of parietal lobe: Secondary | ICD-10-CM

## 2013-02-06 DIAGNOSIS — R600 Localized edema: Secondary | ICD-10-CM

## 2013-02-06 DIAGNOSIS — R809 Proteinuria, unspecified: Secondary | ICD-10-CM

## 2013-02-06 DIAGNOSIS — Z5111 Encounter for antineoplastic chemotherapy: Secondary | ICD-10-CM

## 2013-02-06 DIAGNOSIS — C712 Malignant neoplasm of temporal lobe: Secondary | ICD-10-CM

## 2013-02-06 DIAGNOSIS — Z5112 Encounter for antineoplastic immunotherapy: Secondary | ICD-10-CM

## 2013-02-06 LAB — CBC WITH DIFFERENTIAL/PLATELET
BASO%: 0.4 % (ref 0.0–2.0)
EOS%: 2.3 % (ref 0.0–7.0)
HCT: 38 % (ref 34.8–46.6)
LYMPH%: 30.2 % (ref 14.0–49.7)
MCH: 30.4 pg (ref 25.1–34.0)
MCHC: 32.4 g/dL (ref 31.5–36.0)
MCV: 94.1 fL (ref 79.5–101.0)
MONO%: 14.6 % — ABNORMAL HIGH (ref 0.0–14.0)
NEUT%: 52.5 % (ref 38.4–76.8)
Platelets: 313 10*3/uL (ref 145–400)

## 2013-02-06 LAB — COMPREHENSIVE METABOLIC PANEL
ALT: 15 U/L (ref 0–35)
AST: 17 U/L (ref 0–37)
Albumin: 3.5 g/dL (ref 3.5–5.2)
Calcium: 9.7 mg/dL (ref 8.4–10.5)
Chloride: 101 mEq/L (ref 96–112)
Creatinine, Ser: 0.48 mg/dL — ABNORMAL LOW (ref 0.50–1.10)
Potassium: 3.9 mEq/L (ref 3.5–5.3)

## 2013-02-06 MED ORDER — ONDANSETRON 8 MG/50ML IVPB (CHCC)
8.0000 mg | Freq: Once | INTRAVENOUS | Status: AC
  Administered 2013-02-06: 8 mg via INTRAVENOUS

## 2013-02-06 MED ORDER — SODIUM CHLORIDE 0.9 % IV SOLN
Freq: Once | INTRAVENOUS | Status: AC
  Administered 2013-02-06: 16:00:00 via INTRAVENOUS

## 2013-02-06 MED ORDER — SODIUM CHLORIDE 0.9 % IV SOLN
10.0000 mg/kg | Freq: Once | INTRAVENOUS | Status: AC
  Administered 2013-02-06: 650 mg via INTRAVENOUS
  Filled 2013-02-06: qty 26

## 2013-02-06 MED ORDER — IRINOTECAN HCL CHEMO INJECTION 100 MG/5ML
130.0000 mg/m2 | Freq: Once | INTRAVENOUS | Status: AC
  Administered 2013-02-06: 220 mg via INTRAVENOUS
  Filled 2013-02-06: qty 11

## 2013-02-06 MED ORDER — ATROPINE SULFATE 0.4 MG/ML IJ SOLN
0.5000 mg | Freq: Once | INTRAMUSCULAR | Status: AC | PRN
  Administered 2013-02-06: 0.52 mg via INTRAVENOUS
  Filled 2013-02-06: qty 1.25

## 2013-02-06 NOTE — Patient Instructions (Addendum)
[facility] Discharge Instructions for Patients Receiving Chemotherapy  Today you received the following chemotherapy agents Carboplatin, avastin.  To help prevent nausea and vomiting after your treatment, we encourage you to take your nausea medication zofran. Begin taking it at anytime upon discharge and take it as often as prescribed for the next 72 hours and as needed.   If you develop nausea and vomiting that is not controlled by your nausea medication, call the clinic. If it is after clinic hours your family physician or the after hours number for the clinic or go to the Emergency Department.   BELOW ARE SYMPTOMS THAT SHOULD BE REPORTED IMMEDIATELY:  *FEVER GREATER THAN 100.5 F  *CHILLS WITH OR WITHOUT FEVER  NAUSEA AND VOMITING THAT IS NOT CONTROLLED WITH YOUR NAUSEA MEDICATION  *UNUSUAL SHORTNESS OF BREATH  *UNUSUAL BRUISING OR BLEEDING  TENDERNESS IN MOUTH AND THROAT WITH OR WITHOUT PRESENCE OF ULCERS  *URINARY PROBLEMS  *BOWEL PROBLEMS  UNUSUAL RASH Items with * indicate a potential emergency and should be followed up as soon as possible.  Please call to let a nurse know about any problems that you may have experienced. Feel free to call the clinic you have any questions or concerns. The clinic phone number is [phone].   I have been informed and understand all the instructions given to me. I know to contact the clinic, my physician, or go to the Emergency Department if any problems should occur. I do not have any questions at this time, but understand that I may call the clinic during office hours   should I have any questions or need assistance in obtaining follow up care.    __________________________________________  _____________  __________ Signature of Patient or Authorized Representative            Date                   Time    __________________________________________ Nurse's Signature

## 2013-02-06 NOTE — Progress Notes (Signed)
At 1510 awaiting patient's arrival from vascular lab.  Noted urine protein = 100.  Verbal order received and read back from Lonna Cobb PA to treat despite urine protein results.

## 2013-02-06 NOTE — Progress Notes (Signed)
OFFICE PROGRESS NOTE  Interval history:   [name] is a 52 year old woman diagnosed with a cystic high-grade glioblastoma of the right frontal and parietal lobe with extension to the basal ganglia in April 2010. She underwent debulking surgery. She was initially treated on a clinical trial of Temodar, irinotecan and Avastin. Course was complicated by bilateral pulmonary emboli and diffuse bilateral interstitial pulmonary infiltrates, suspected pneumocystis pneumonia in April 2010. She was subsequently treated off protocol with cranial radiation and concomitant low-dose oral Temodar 06/05/2009 through 07/16/2009. She was then treated with maintenance Temodar 200 mg per meter square for 5 days each month. There was evidence of early progression while on Temodar in December 2010. Avastin was resumed at that time with disease stabilization. She completed 12 cycles of adjuvant Temodar plus Avastin 08/11/2010. She then began maintenance Avastin 15 mg per kilogram every 4 weeks.   Followup MRI of the brain done at [facility] on 08/29/2012 showed signs of progression. It was recommended to start her back on Temodar 80 mg daily and to increase Avastin back to 10 mg per kilogram IV every 2 weeks. She was treated with Avastin on 09/05/2012 and 09/19/2012.  We received a call from [facility] on 09/22/2012 that the tumor was growing and they advised adding irinotecan 125 mg per meter squared with the next Avastin. Irinotecan was added on a 2 week schedule beginning 10/03/2012. Stable disease on MRI scan done at [facility] on 11/03/2012.  She is seen today for scheduled followup. She denies nausea/vomiting. No headaches. No mouth sores. No diarrhea. She has occasional abdominal pain which she feels is diet related. She denies shortness breath. No cough or chest pain. She recently noted swelling of the left foot. She is having pain at the left thigh.   Objective: Blood pressure 135/94, pulse 76, temperature 97 F (36.1 C),  temperature source Oral, resp. rate 18, height 5\' 3"  (1.6 m), weight 138 lb 9.6 oz (62.869 kg).  Pupils equal round and reactive to light. Extraocular movements intact. Sclera anicteric. Oropharynx without thrush or ulceration. Lungs are clear. Regular cardiac rhythm. Abdomen soft and nontender. No organomegaly. The entire left leg appears mildly edematous. The left calf has a full appearance as compared to the right calf. Motor strength 5 over 5. Knee DTRs 2+, symmetric.  Lab Results: Lab Results  Component Value Date   WBC 4.7 02/06/2013   HGB 12.3 02/06/2013   HCT 38.0 02/06/2013   MCV 94.1 02/06/2013   PLT 313 02/06/2013    Chemistry:    Chemistry      Component Value Date/Time   NA 133* 01/23/2013 1250   NA 133* 06/13/2012 1145   NA 138 03/20/2009 1454   K 4.0 01/23/2013 1250   K 4.0 06/13/2012 1145   K 3.8 03/20/2009 1454   CL 102 01/23/2013 1250   CL 95* 06/13/2012 1145   CL 100 03/20/2009 1454   CO2 22 01/23/2013 1250   CO2 27 06/13/2012 1145   CO2 27 03/20/2009 1454   BUN 11.9 01/23/2013 1250   BUN 14 06/13/2012 1145   BUN 18 03/20/2009 1454   CREATININE 0.6 01/23/2013 1250   CREATININE 0.51 11/28/2012 1401   CREATININE 0.53 06/13/2012 1145   CREATININE 0.6 03/20/2009 1454      Component Value Date/Time   CALCIUM 9.2 01/23/2013 1250   CALCIUM 10.1 06/13/2012 1145   CALCIUM 8.6 03/20/2009 1454   ALKPHOS 61 01/23/2013 1250   ALKPHOS 56 06/13/2012 1145   ALKPHOS 65 03/20/2009  1454   AST 16 01/23/2013 1250   AST 22 06/13/2012 1145   AST 14 03/20/2009 1454   ALT 18 01/23/2013 1250   ALT 19 06/13/2012 1145   BILITOT 0.21 01/23/2013 1250   BILITOT 0.3 06/13/2012 1145   BILITOT 0.50 03/20/2009 1454       Studies/Results: No results found.  Medications: I have reviewed the patient's current medications.  Assessment/Plan:  1. 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  She underwent debulking surgery. She was initially treated on a clinical trial with Temodar, irinotecan and Avastin. Course was complicated by development of bilateral pulmonary emboli and diffuse bilateral interstitial pulmonary infiltrates and suspected pneumocystis pneumonia April 2010. She was subsequently treated off protocol with cranial radiation with concomitant low-dose oral Temodar 07/21 through 07/16/2009. Subsequent maintenance Temodar 200 mg per meter squared 5 days each month. There was early progression while on Temodar December 2010. Avastin was added back at that time with disease stabilization. She completed 12 cycles of adjuvant Temodar plus Avastin 08/11/2010. Maintenance Avastin continued every 4 weeks. MRI of the brain at [facility] on 05/26/2012 with no evidence of disease progression. MRI of the brain at [facility] on 08/29/2012 with evidence of disease progression. Temodar resumed at a daily dose and Avastin change to 10 mg per kilogram IV every 2 weeks. Irinotecan added on a 2 week schedule beginning 10/03/2012. Restaging brain MRI 11/03/2012 with stable disease. 2. Bilateral pulmonary emboli occurring in July 2010. Coumadin was discontinued following an office visit 10/17/2012. 3. History of bilateral interstitial pneumonia occurring around the time of initial diagnosis of GBM. She was treated empirically for Pneumocystis. 4. Asymmetric leg edema 11/04/2012. Bilateral venous Doppler negative for DVT. 5. Intermittent proteinuria related to Avastin. Urine protein measured at 100 mg/dL today. She had a 24-hour urine in December 2013 after the urine protein returned at 300 mg/dL. The 24-hour urine showed 572 mg of protein excretion. 6. Left lower extremity pain and swelling. Refer for venous Doppler.  Disposition-continue daily Temodar and every 2 week irinotecan/Avastin pending her upcoming appointment at [facility]. She will return for a followup visit with Dr. Beryle Beams on 03/06/2013. We will  followup on the venous Doppler of the left leg from today.  Ned Card ANP/GNP-BC

## 2013-02-06 NOTE — Progress Notes (Signed)
*  PRELIMINARY RESULTS* Vascular Ultrasound Left lower extremity venous duplex has been completed.  Preliminary findings: Left = no evidence of DVT or baker's cyst.   Called report to [facility] at Dr. Patsy Lager office.  Farrel Demark, RDMS, RVT  02/06/2013, 3:46 PM

## 2013-02-08 ENCOUNTER — Telehealth: Payer: Self-pay | Admitting: *Deleted

## 2013-02-08 NOTE — Telephone Encounter (Signed)
Called pt & verified that she had already received this report when she was in the office that doppler was negative for new clot.

## 2013-02-08 NOTE — Telephone Encounter (Signed)
Message copied by Sabino Snipes on Wed Feb 08, 2013  1:46 PM ------      Message from: Levert Feinstein      Created: Wed Feb 08, 2013 12:12 AM       Call pt doppler negative for new clot ------

## 2013-02-20 ENCOUNTER — Other Ambulatory Visit: Payer: Self-pay | Admitting: Oncology

## 2013-02-20 ENCOUNTER — Other Ambulatory Visit (HOSPITAL_BASED_OUTPATIENT_CLINIC_OR_DEPARTMENT_OTHER): Payer: Medicare Other | Admitting: Lab

## 2013-02-20 ENCOUNTER — Ambulatory Visit (HOSPITAL_BASED_OUTPATIENT_CLINIC_OR_DEPARTMENT_OTHER): Payer: Medicare Other

## 2013-02-20 VITALS — BP 143/95 | HR 85 | Temp 97.8°F | Resp 18

## 2013-02-20 DIAGNOSIS — C711 Malignant neoplasm of frontal lobe: Secondary | ICD-10-CM

## 2013-02-20 DIAGNOSIS — C719 Malignant neoplasm of brain, unspecified: Secondary | ICD-10-CM

## 2013-02-20 DIAGNOSIS — Z5112 Encounter for antineoplastic immunotherapy: Secondary | ICD-10-CM

## 2013-02-20 DIAGNOSIS — C713 Malignant neoplasm of parietal lobe: Secondary | ICD-10-CM

## 2013-02-20 DIAGNOSIS — Z5111 Encounter for antineoplastic chemotherapy: Secondary | ICD-10-CM

## 2013-02-20 DIAGNOSIS — C712 Malignant neoplasm of temporal lobe: Secondary | ICD-10-CM

## 2013-02-20 LAB — CBC WITH DIFFERENTIAL/PLATELET
BASO%: 0.2 % (ref 0.0–2.0)
EOS%: 2.1 % (ref 0.0–7.0)
HCT: 37.3 % (ref 34.8–46.6)
MCH: 30.1 pg (ref 25.1–34.0)
MCHC: 33 g/dL (ref 31.5–36.0)
MONO%: 14.7 % — ABNORMAL HIGH (ref 0.0–14.0)
NEUT%: 61.1 % (ref 38.4–76.8)
lymph#: 1.3 10*3/uL (ref 0.9–3.3)

## 2013-02-20 MED ORDER — ONDANSETRON 8 MG/50ML IVPB (CHCC)
8.0000 mg | Freq: Once | INTRAVENOUS | Status: AC
  Administered 2013-02-20: 8 mg via INTRAVENOUS

## 2013-02-20 MED ORDER — DEXTROSE 5 % IV SOLN
130.0000 mg/m2 | Freq: Once | INTRAVENOUS | Status: AC
  Administered 2013-02-20: 220 mg via INTRAVENOUS
  Filled 2013-02-20: qty 11

## 2013-02-20 MED ORDER — SODIUM CHLORIDE 0.9 % IV SOLN
10.0000 mg/kg | Freq: Once | INTRAVENOUS | Status: AC
  Administered 2013-02-20: 650 mg via INTRAVENOUS
  Filled 2013-02-20: qty 26

## 2013-02-20 MED ORDER — ATROPINE SULFATE 0.4 MG/ML IJ SOLN
0.5000 mg | Freq: Once | INTRAMUSCULAR | Status: AC | PRN
  Administered 2013-02-20: 0.5 mg via INTRAVENOUS
  Filled 2013-02-20: qty 1.25

## 2013-02-20 MED ORDER — SODIUM CHLORIDE 0.9 % IV SOLN
Freq: Once | INTRAVENOUS | Status: AC
  Administered 2013-02-20: 14:00:00 via INTRAVENOUS

## 2013-02-21 ENCOUNTER — Encounter: Payer: Self-pay | Admitting: *Deleted

## 2013-02-21 ENCOUNTER — Other Ambulatory Visit: Payer: Self-pay | Admitting: Certified Registered Nurse Anesthetist

## 2013-02-21 NOTE — Progress Notes (Signed)
RECEIVED A FAX FROM [facility] CONCERNING A CONFIRMATION OF PRESCRIPTION SHIPMENT FOR TEMOZOLOMIDE ON 02/20/13.

## 2013-03-06 ENCOUNTER — Ambulatory Visit (HOSPITAL_BASED_OUTPATIENT_CLINIC_OR_DEPARTMENT_OTHER): Payer: Medicare Other

## 2013-03-06 ENCOUNTER — Ambulatory Visit (HOSPITAL_BASED_OUTPATIENT_CLINIC_OR_DEPARTMENT_OTHER): Payer: Medicare Other | Admitting: Oncology

## 2013-03-06 ENCOUNTER — Telehealth: Payer: Self-pay | Admitting: Oncology

## 2013-03-06 ENCOUNTER — Other Ambulatory Visit: Payer: Self-pay | Admitting: *Deleted

## 2013-03-06 ENCOUNTER — Other Ambulatory Visit (HOSPITAL_BASED_OUTPATIENT_CLINIC_OR_DEPARTMENT_OTHER): Payer: Medicare Other

## 2013-03-06 VITALS — BP 139/89 | HR 78 | Temp 97.0°F | Resp 20 | Ht 63.0 in | Wt 137.7 lb

## 2013-03-06 VITALS — BP 148/86 | HR 84 | Temp 98.0°F | Resp 20

## 2013-03-06 DIAGNOSIS — R809 Proteinuria, unspecified: Secondary | ICD-10-CM

## 2013-03-06 DIAGNOSIS — C713 Malignant neoplasm of parietal lobe: Secondary | ICD-10-CM

## 2013-03-06 DIAGNOSIS — C712 Malignant neoplasm of temporal lobe: Secondary | ICD-10-CM

## 2013-03-06 DIAGNOSIS — I2699 Other pulmonary embolism without acute cor pulmonale: Secondary | ICD-10-CM

## 2013-03-06 DIAGNOSIS — C711 Malignant neoplasm of frontal lobe: Secondary | ICD-10-CM

## 2013-03-06 DIAGNOSIS — Z5112 Encounter for antineoplastic immunotherapy: Secondary | ICD-10-CM

## 2013-03-06 DIAGNOSIS — Z7901 Long term (current) use of anticoagulants: Secondary | ICD-10-CM

## 2013-03-06 DIAGNOSIS — E039 Hypothyroidism, unspecified: Secondary | ICD-10-CM

## 2013-03-06 DIAGNOSIS — C719 Malignant neoplasm of brain, unspecified: Secondary | ICD-10-CM

## 2013-03-06 DIAGNOSIS — Z5111 Encounter for antineoplastic chemotherapy: Secondary | ICD-10-CM

## 2013-03-06 DIAGNOSIS — Z86718 Personal history of other venous thrombosis and embolism: Secondary | ICD-10-CM

## 2013-03-06 LAB — COMPREHENSIVE METABOLIC PANEL (CC13)
ALT: 20 U/L (ref 0–55)
Alkaline Phosphatase: 64 U/L (ref 40–150)
Sodium: 141 mEq/L (ref 136–145)
Total Bilirubin: 0.24 mg/dL (ref 0.20–1.20)
Total Protein: 7.3 g/dL (ref 6.4–8.3)

## 2013-03-06 LAB — CBC WITH DIFFERENTIAL/PLATELET
BASO%: 0.6 % (ref 0.0–2.0)
EOS%: 2.5 % (ref 0.0–7.0)
MCH: 30.1 pg (ref 25.1–34.0)
MCHC: 32.9 g/dL (ref 31.5–36.0)
MCV: 91.4 fL (ref 79.5–101.0)
MONO%: 17 % — ABNORMAL HIGH (ref 0.0–14.0)
NEUT#: 1.9 10*3/uL (ref 1.5–6.5)
RBC: 4.09 10*6/uL (ref 3.70–5.45)
RDW: 16.5 % — ABNORMAL HIGH (ref 11.2–14.5)

## 2013-03-06 LAB — UA PROTEIN, DIPSTICK - CHCC: Protein, ur: 100 mg/dL

## 2013-03-06 MED ORDER — VALACYCLOVIR HCL 500 MG PO TABS: 500.0000 mg | ORAL_TABLET | Freq: Every day | ORAL | Status: DC

## 2013-03-06 MED ORDER — SODIUM CHLORIDE 0.9 % IV SOLN
10.0000 mg/kg | Freq: Once | INTRAVENOUS | Status: AC
  Administered 2013-03-06: 650 mg via INTRAVENOUS
  Filled 2013-03-06: qty 26

## 2013-03-06 MED ORDER — ATROPINE SULFATE 1 MG/ML IJ SOLN
0.5000 mg | Freq: Once | INTRAMUSCULAR | Status: AC | PRN
  Administered 2013-03-06: 0.5 mg via INTRAVENOUS

## 2013-03-06 MED ORDER — AMLODIPINE BESYLATE 10 MG PO TABS: 10.0000 mg | ORAL_TABLET | Freq: Every day | ORAL | Status: DC

## 2013-03-06 MED ORDER — ONDANSETRON 8 MG/50ML IVPB (CHCC)
8.0000 mg | Freq: Once | INTRAVENOUS | Status: AC
  Administered 2013-03-06: 8 mg via INTRAVENOUS

## 2013-03-06 MED ORDER — SODIUM CHLORIDE 0.9 % IV SOLN
Freq: Once | INTRAVENOUS | Status: AC
  Administered 2013-03-06: 12:00:00 via INTRAVENOUS

## 2013-03-06 MED ORDER — IRINOTECAN HCL CHEMO INJECTION 100 MG/5ML
130.0000 mg/m2 | Freq: Once | INTRAVENOUS | Status: AC
  Administered 2013-03-06: 220 mg via INTRAVENOUS
  Filled 2013-03-06: qty 11

## 2013-03-06 NOTE — Progress Notes (Signed)
Vital signs stable pre and post Avastin 

## 2013-03-06 NOTE — Progress Notes (Signed)
Hematology and Oncology Follow Up Visit  Mercedes Barnes [id number] 08-09-1961 52 y.o. 03/06/2013 2:10 PM   Principle Diagnosis: Encounter Diagnosis  Name Primary?  . 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 After a brief treatment on a clinical trial at [facility] on, due to complications and further progression, she was treated with standard Temodar/radiation and achieved a excellent stabilization. She had a first progression in December 2010. She was put back on treatment with Temodar. She was far enough out from her pulmonary embolism and still on full dose anticoagulation in the [facility] physicians felt it would be safe to put her on Avastin as well. She had a second progression in October 2013. A regimen of Temodar, Camptosar, and Avastin was recommended and initiated. She has tolerated this combination quite well with minimal myelosuppression or any other toxicities. She has had stable disease on serial two-month scans done at [facility] including most recent study done on 02/09/2013. There still residual tumor in the area of the basal ganglia. She some problems with a visual field cut lateral left eye. Her memory and concentration are otherwise stable. No seizure activity. She is not on any anticoagulation at this time. She still has swelling of her left calf. Recent survey Doppler study done March 24 did not show any new clot.   Medications: reviewed  Allergies:  Allergies  Allergen Reactions  . Co-Trimoxazole Injection (Sulfamethoxazole-Trimethoprim) Hives  . Pentamidine     Hypotension & weakness    Review of Systems: Constitutional:   No constitutional symptoms. No fever or infection since last visit. Respiratory: No cough or  dyspnea Cardiovascular:  No chest pain or palpitations Gastrointestinal: No abdominal pain or change in bowel habit Genito-Urinary: No recurrent urinary tract symptoms. No recurrent vaginal infection. Musculoskeletal: No muscle bone or joint pain Neurologic: See above Skin: No rash or ecchymosis Remaining ROS negative.  Physical Exam: Blood pressure 139/89, pulse 78, temperature 97 F (36.1 C), temperature source Oral, resp. rate 20, height 5\' 3"  (1.6 m), weight 137 lb 11.2 oz (62.46 kg). Wt Readings from Last 3 Encounters:  03/06/13 137 lb 11.2 oz (62.46 kg)  02/06/13 138 lb 9.6 oz (62.869 kg)  01/09/13 142 lb 6.4 oz (64.592 kg)     General appearance: Petite Caucasian woman [name]: Pharynx no erythema or exudate Lymph nodes: No adenopathy Breasts: Lungs: Clear to auscultation resonant to percussion Heart: Regular rhythm no murmur Abdomen: Soft, nontender, no mass, no organomegaly Extremities: Asymmetric edema left calf versus right. No tenderness Vascular: No cyanosis Neurologic: She is alert and oriented, PERRLA, optic discs sharp vessels normal, cranial nerves with decreased vision left lateral field cut. Motor strength is 5 over 5. Reflexes 2+ symmetric. She is still clumsy on finger to finger and rapid alternating movements of her left hand which is a chronic finding. No ataxia. Skin: No rash or ecchymosis  Lab Results: Lab Results  Component Value Date   WBC 3.6* 03/06/2013   HGB 12.3 03/06/2013   HCT 37.4 03/06/2013   MCV 91.4 03/06/2013   PLT 311 03/06/2013     Chemistry      Component Value Date/Time   NA 141 03/06/2013 1012   NA 134* 02/06/2013 1632   NA 138 03/20/2009 1454   K  4.4 03/06/2013 1012   K 3.9 02/06/2013 1632   K 3.8 03/20/2009 1454   CL 108* 03/06/2013 1012   CL 101 02/06/2013 1632   CL 100 03/20/2009 1454   CO2 24 03/06/2013 1012   CO2 24 02/06/2013 1632   CO2 27 03/20/2009 1454   BUN 9.9 03/06/2013 1012   BUN 15 02/06/2013 1632   BUN 18 03/20/2009 1454    CREATININE 0.7 03/06/2013 1012   CREATININE 0.48* 02/06/2013 1632   CREATININE 0.51 11/28/2012 1401   CREATININE 0.6 03/20/2009 1454      Component Value Date/Time   CALCIUM 9.9 03/06/2013 1012   CALCIUM 9.7 02/06/2013 1632   CALCIUM 8.6 03/20/2009 1454   ALKPHOS 64 03/06/2013 1012   ALKPHOS 53 02/06/2013 1632   ALKPHOS 65 03/20/2009 1454   AST 17 03/06/2013 1012   AST 17 02/06/2013 1632   AST 14 03/20/2009 1454   ALT 20 03/06/2013 1012   ALT 15 02/06/2013 1632   BILITOT 0.24 03/06/2013 1012   BILITOT 0.1* 02/06/2013 1632   BILITOT 0.50 03/20/2009 1454       Radiological Studies: No results found.  Impression and Plan: #1. Relapsed glioblastoma Stable disease on current regimen 2 minute or 80 mg daily, Avastin 10 mg per kilogram IV Q2 weeks, and Camptosar 130 mg per meter squared IV every 2 weeks. Plan: Continue the same  #2. Pulmonary embolus shortly after diagnosis in July 2010. Subsequent left lower extremity DVT. Currently not on anticoagulation.  #3. 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   CC:.    Annia Belt, MD 4/21/20142:10 PM

## 2013-03-06 NOTE — Telephone Encounter (Signed)
gv pt appt schedule for May thru June.

## 2013-03-06 NOTE — Patient Instructions (Signed)
Patient aware of next appointment; discharged home with no complaints. 

## 2013-03-07 LAB — PROTEIN, URINE, 24 HOUR: Protein, 24H Urine: 1764 mg/d — ABNORMAL HIGH (ref 50–100)

## 2013-03-08 ENCOUNTER — Telehealth: Payer: Self-pay | Admitting: *Deleted

## 2013-03-08 NOTE — Telephone Encounter (Signed)
Pt notified of urine protein results & possibility of holding next dose of avastin but Dr Cyndie Chime will discuss with [facility] physicians before that decision is made.  Pt expressed understanding.

## 2013-03-08 NOTE — Telephone Encounter (Signed)
Message copied by Sabino Snipes on Wed Mar 08, 2013 11:21 AM ------      Message from: Levert Feinstein      Created: Tue Mar 07, 2013  8:58 PM       Call patient: urine protein 1.8 grams - need to hold next dose of Avastin ------

## 2013-03-15 ENCOUNTER — Other Ambulatory Visit: Payer: Self-pay | Admitting: Oncology

## 2013-03-20 ENCOUNTER — Ambulatory Visit (HOSPITAL_BASED_OUTPATIENT_CLINIC_OR_DEPARTMENT_OTHER): Payer: Medicare Other

## 2013-03-20 ENCOUNTER — Other Ambulatory Visit (HOSPITAL_BASED_OUTPATIENT_CLINIC_OR_DEPARTMENT_OTHER): Payer: Medicare Other | Admitting: Lab

## 2013-03-20 VITALS — BP 129/89 | HR 82 | Temp 97.5°F | Resp 18

## 2013-03-20 DIAGNOSIS — C712 Malignant neoplasm of temporal lobe: Secondary | ICD-10-CM

## 2013-03-20 DIAGNOSIS — Z5111 Encounter for antineoplastic chemotherapy: Secondary | ICD-10-CM

## 2013-03-20 DIAGNOSIS — Z7901 Long term (current) use of anticoagulants: Secondary | ICD-10-CM

## 2013-03-20 DIAGNOSIS — I2699 Other pulmonary embolism without acute cor pulmonale: Secondary | ICD-10-CM

## 2013-03-20 DIAGNOSIS — C719 Malignant neoplasm of brain, unspecified: Secondary | ICD-10-CM

## 2013-03-20 DIAGNOSIS — C713 Malignant neoplasm of parietal lobe: Secondary | ICD-10-CM

## 2013-03-20 DIAGNOSIS — C711 Malignant neoplasm of frontal lobe: Secondary | ICD-10-CM

## 2013-03-20 DIAGNOSIS — Z5112 Encounter for antineoplastic immunotherapy: Secondary | ICD-10-CM

## 2013-03-20 LAB — CBC WITH DIFFERENTIAL/PLATELET
Eosinophils Absolute: 0.1 10*3/uL (ref 0.0–0.5)
HCT: 36.5 % (ref 34.8–46.6)
LYMPH%: 26.7 % (ref 14.0–49.7)
MONO#: 0.6 10*3/uL (ref 0.1–0.9)
NEUT#: 2.4 10*3/uL (ref 1.5–6.5)
NEUT%: 56.3 % (ref 38.4–76.8)
Platelets: 293 10*3/uL (ref 145–400)
RBC: 4.03 10*6/uL (ref 3.70–5.45)
WBC: 4.3 10*3/uL (ref 3.9–10.3)
lymph#: 1.2 10*3/uL (ref 0.9–3.3)

## 2013-03-20 LAB — COMPREHENSIVE METABOLIC PANEL (CC13)
ALT: 18 U/L (ref 0–55)
AST: 19 U/L (ref 5–34)
Albumin: 3.3 g/dL — ABNORMAL LOW (ref 3.5–5.0)
CO2: 24 mEq/L (ref 22–29)
Calcium: 9.4 mg/dL (ref 8.4–10.4)
Chloride: 107 mEq/L (ref 98–107)
Creatinine: 0.6 mg/dL (ref 0.6–1.1)
Potassium: 4 mEq/L (ref 3.5–5.1)

## 2013-03-20 LAB — UA PROTEIN, DIPSTICK - CHCC: Protein, ur: 30 mg/dL

## 2013-03-20 MED ORDER — SODIUM CHLORIDE 0.9 % IV SOLN
Freq: Once | INTRAVENOUS | Status: AC
  Administered 2013-03-20: 15:00:00 via INTRAVENOUS

## 2013-03-20 MED ORDER — SODIUM CHLORIDE 0.9 % IV SOLN
10.0000 mg/kg | Freq: Once | INTRAVENOUS | Status: AC
  Administered 2013-03-20: 650 mg via INTRAVENOUS
  Filled 2013-03-20: qty 26

## 2013-03-20 MED ORDER — ONDANSETRON 8 MG/50ML IVPB (CHCC)
8.0000 mg | Freq: Once | INTRAVENOUS | Status: AC
  Administered 2013-03-20: 8 mg via INTRAVENOUS

## 2013-03-20 MED ORDER — ATROPINE SULFATE 0.4 MG/ML IJ SOLN
0.5000 mg | Freq: Once | INTRAMUSCULAR | Status: AC | PRN
  Administered 2013-03-20: 0.5 mg via INTRAVENOUS
  Filled 2013-03-20: qty 1.25

## 2013-03-20 MED ORDER — IRINOTECAN HCL CHEMO INJECTION 100 MG/5ML
130.0000 mg/m2 | Freq: Once | INTRAVENOUS | Status: AC
  Administered 2013-03-20: 220 mg via INTRAVENOUS
  Filled 2013-03-20: qty 11

## 2013-03-20 NOTE — Patient Instructions (Addendum)
[facility] Discharge Instructions for Patients Receiving Chemotherapy  Today you received the following chemotherapy agents Camptosar and Avastin.  To help prevent nausea and vomiting after your treatment, we encourage you to take your nausea medication.   If you develop nausea and vomiting that is not controlled by your nausea medication, call the clinic. If it is after clinic hours your family physician or the after hours number for the clinic or go to the Emergency Department.   BELOW ARE SYMPTOMS THAT SHOULD BE REPORTED IMMEDIATELY:  *FEVER GREATER THAN 100.5 F  *CHILLS WITH OR WITHOUT FEVER  NAUSEA AND VOMITING THAT IS NOT CONTROLLED WITH YOUR NAUSEA MEDICATION  *UNUSUAL SHORTNESS OF BREATH  *UNUSUAL BRUISING OR BLEEDING  TENDERNESS IN MOUTH AND THROAT WITH OR WITHOUT PRESENCE OF ULCERS  *URINARY PROBLEMS  *BOWEL PROBLEMS  UNUSUAL RASH Items with * indicate a potential emergency and should be followed up as soon as possible.  One of the nurses will contact you 24 hours after your treatment. Please let the nurse know about any problems that you may have experienced. Feel free to call the clinic you have any questions or concerns. The clinic phone number is [phone].   I have been informed and understand all the instructions given to me. I know to contact the clinic, my physician, or go to the Emergency Department if any problems should occur. I do not have any questions at this time, but understand that I may call the clinic during office hours   should I have any questions or need assistance in obtaining follow up care.    __________________________________________  _____________  __________ Signature of Patient or Authorized Representative            Date                   Time    __________________________________________ Nurse's Signature

## 2013-03-21 ENCOUNTER — Encounter: Payer: Self-pay | Admitting: *Deleted

## 2013-03-21 NOTE — Progress Notes (Signed)
RECEIVED A FAX FROM [facility] CONCERNING A CONFIRMATION OF PRESCRIPTION SHIPMENT FOR TEMOZOLOMIDE ON 03/20/13.

## 2013-04-03 ENCOUNTER — Other Ambulatory Visit (HOSPITAL_BASED_OUTPATIENT_CLINIC_OR_DEPARTMENT_OTHER): Payer: Medicare Other

## 2013-04-03 ENCOUNTER — Ambulatory Visit (HOSPITAL_BASED_OUTPATIENT_CLINIC_OR_DEPARTMENT_OTHER): Payer: Medicare Other

## 2013-04-03 VITALS — BP 147/95 | HR 86 | Temp 97.6°F

## 2013-04-03 DIAGNOSIS — Z5112 Encounter for antineoplastic immunotherapy: Secondary | ICD-10-CM

## 2013-04-03 DIAGNOSIS — I2699 Other pulmonary embolism without acute cor pulmonale: Secondary | ICD-10-CM

## 2013-04-03 DIAGNOSIS — C712 Malignant neoplasm of temporal lobe: Secondary | ICD-10-CM

## 2013-04-03 DIAGNOSIS — C713 Malignant neoplasm of parietal lobe: Secondary | ICD-10-CM

## 2013-04-03 DIAGNOSIS — Z5111 Encounter for antineoplastic chemotherapy: Secondary | ICD-10-CM

## 2013-04-03 DIAGNOSIS — C711 Malignant neoplasm of frontal lobe: Secondary | ICD-10-CM

## 2013-04-03 DIAGNOSIS — C719 Malignant neoplasm of brain, unspecified: Secondary | ICD-10-CM

## 2013-04-03 DIAGNOSIS — Z7901 Long term (current) use of anticoagulants: Secondary | ICD-10-CM

## 2013-04-03 LAB — UA PROTEIN, DIPSTICK - CHCC: Protein, ur: 30 mg/dL

## 2013-04-03 LAB — CBC WITH DIFFERENTIAL/PLATELET
BASO%: 0.2 % (ref 0.0–2.0)
EOS%: 2.2 % (ref 0.0–7.0)
HCT: 36.1 % (ref 34.8–46.6)
MCH: 30.3 pg (ref 25.1–34.0)
MCHC: 33.5 g/dL (ref 31.5–36.0)
MONO#: 0.8 10*3/uL (ref 0.1–0.9)
NEUT%: 55.4 % (ref 38.4–76.8)
RBC: 4 10*6/uL (ref 3.70–5.45)
RDW: 16.8 % — ABNORMAL HIGH (ref 11.2–14.5)
WBC: 4.9 10*3/uL (ref 3.9–10.3)
lymph#: 1.3 10*3/uL (ref 0.9–3.3)

## 2013-04-03 LAB — COMPREHENSIVE METABOLIC PANEL (CC13)
ALT: 23 U/L (ref 0–55)
AST: 21 U/L (ref 5–34)
Albumin: 3.3 g/dL — ABNORMAL LOW (ref 3.5–5.0)
Alkaline Phosphatase: 64 U/L (ref 40–150)
BUN: 15.7 mg/dL (ref 7.0–26.0)
Calcium: 9.7 mg/dL (ref 8.4–10.4)
Chloride: 105 mEq/L (ref 98–107)
Potassium: 4.2 mEq/L (ref 3.5–5.1)
Sodium: 138 mEq/L (ref 136–145)

## 2013-04-03 MED ORDER — ONDANSETRON 8 MG/50ML IVPB (CHCC)
8.0000 mg | Freq: Once | INTRAVENOUS | Status: AC
  Administered 2013-04-03: 8 mg via INTRAVENOUS

## 2013-04-03 MED ORDER — SODIUM CHLORIDE 0.9 % IV SOLN
10.0000 mg/kg | Freq: Once | INTRAVENOUS | Status: AC
  Administered 2013-04-03: 650 mg via INTRAVENOUS
  Filled 2013-04-03: qty 26

## 2013-04-03 MED ORDER — IRINOTECAN HCL CHEMO INJECTION 100 MG/5ML
130.0000 mg/m2 | Freq: Once | INTRAVENOUS | Status: AC
  Administered 2013-04-03: 220 mg via INTRAVENOUS
  Filled 2013-04-03: qty 11

## 2013-04-03 MED ORDER — ATROPINE SULFATE 1 MG/ML IJ SOLN
0.5000 mg | Freq: Once | INTRAMUSCULAR | Status: AC | PRN
  Administered 2013-04-03: 0.5 mg via INTRAVENOUS

## 2013-04-03 MED ORDER — SODIUM CHLORIDE 0.9 % IV SOLN
Freq: Once | INTRAVENOUS | Status: AC
  Administered 2013-04-03: 13:00:00 via INTRAVENOUS

## 2013-04-03 NOTE — Patient Instructions (Signed)
 [facility] Discharge Instructions for Patients Receiving Chemotherapy  Today you received the following chemotherapy agents Avastin/Irinotecan To help prevent nausea and vomiting after your treatment, we encourage you to take your nausea medication as prescribed.  If you develop nausea and vomiting that is not controlled by your nausea medication, call the clinic. If it is after clinic hours your family physician or the after hours number for the clinic or go to the Emergency Department.   BELOW ARE SYMPTOMS THAT SHOULD BE REPORTED IMMEDIATELY:  *FEVER GREATER THAN 100.5 F  *CHILLS WITH OR WITHOUT FEVER  NAUSEA AND VOMITING THAT IS NOT CONTROLLED WITH YOUR NAUSEA MEDICATION  *UNUSUAL SHORTNESS OF BREATH  *UNUSUAL BRUISING OR BLEEDING  TENDERNESS IN MOUTH AND THROAT WITH OR WITHOUT PRESENCE OF ULCERS  *URINARY PROBLEMS  *BOWEL PROBLEMS  UNUSUAL RASH Items with * indicate a potential emergency and should be followed up as soon as possible.  Feel free to call the clinic you have any questions or concerns. The clinic phone number is [phone].   I have been informed and understand all the instructions given to me. I know to contact the clinic, my physician, or go to the Emergency Department if any problems should occur. I do not have any questions at this time, but understand that I may call the clinic during office hours   should I have any questions or need assistance in obtaining follow up care.    __________________________________________  _____________  __________ Signature of Patient or Authorized Representative            Date                   Time    __________________________________________ Nurse's Signature

## 2013-04-15 ENCOUNTER — Other Ambulatory Visit (HOSPITAL_COMMUNITY): Payer: Self-pay | Admitting: Oncology

## 2013-04-17 ENCOUNTER — Other Ambulatory Visit (HOSPITAL_BASED_OUTPATIENT_CLINIC_OR_DEPARTMENT_OTHER): Payer: Medicare Other | Admitting: Lab

## 2013-04-17 ENCOUNTER — Telehealth: Payer: Self-pay | Admitting: Oncology

## 2013-04-17 ENCOUNTER — Ambulatory Visit (HOSPITAL_BASED_OUTPATIENT_CLINIC_OR_DEPARTMENT_OTHER): Payer: Medicare Other | Admitting: Nurse Practitioner

## 2013-04-17 ENCOUNTER — Telehealth: Payer: Self-pay | Admitting: *Deleted

## 2013-04-17 ENCOUNTER — Ambulatory Visit (HOSPITAL_BASED_OUTPATIENT_CLINIC_OR_DEPARTMENT_OTHER): Payer: Medicare Other

## 2013-04-17 VITALS — BP 117/75 | HR 65 | Resp 20

## 2013-04-17 VITALS — BP 130/86 | HR 85 | Temp 97.0°F | Resp 18 | Ht 63.0 in | Wt 141.2 lb

## 2013-04-17 DIAGNOSIS — Z5111 Encounter for antineoplastic chemotherapy: Secondary | ICD-10-CM

## 2013-04-17 DIAGNOSIS — R809 Proteinuria, unspecified: Secondary | ICD-10-CM

## 2013-04-17 DIAGNOSIS — C711 Malignant neoplasm of frontal lobe: Secondary | ICD-10-CM

## 2013-04-17 DIAGNOSIS — C713 Malignant neoplasm of parietal lobe: Secondary | ICD-10-CM

## 2013-04-17 DIAGNOSIS — C712 Malignant neoplasm of temporal lobe: Secondary | ICD-10-CM

## 2013-04-17 DIAGNOSIS — M79609 Pain in unspecified limb: Secondary | ICD-10-CM

## 2013-04-17 DIAGNOSIS — I2699 Other pulmonary embolism without acute cor pulmonale: Secondary | ICD-10-CM

## 2013-04-17 DIAGNOSIS — C719 Malignant neoplasm of brain, unspecified: Secondary | ICD-10-CM

## 2013-04-17 DIAGNOSIS — Z7901 Long term (current) use of anticoagulants: Secondary | ICD-10-CM

## 2013-04-17 DIAGNOSIS — Z5112 Encounter for antineoplastic immunotherapy: Secondary | ICD-10-CM

## 2013-04-17 LAB — CBC WITH DIFFERENTIAL/PLATELET
BASO%: 0.4 % (ref 0.0–2.0)
EOS%: 2.4 % (ref 0.0–7.0)
HCT: 33.9 % — ABNORMAL LOW (ref 34.8–46.6)
LYMPH%: 23.3 % (ref 14.0–49.7)
MCH: 30.8 pg (ref 25.1–34.0)
MCHC: 33 g/dL (ref 31.5–36.0)
MCV: 93.1 fL (ref 79.5–101.0)
MONO%: 15.7 % — ABNORMAL HIGH (ref 0.0–14.0)
NEUT%: 58.2 % (ref 38.4–76.8)
Platelets: 299 10*3/uL (ref 145–400)
RBC: 3.64 10*6/uL — ABNORMAL LOW (ref 3.70–5.45)
WBC: 5.1 10*3/uL (ref 3.9–10.3)
nRBC: 0 % (ref 0–0)

## 2013-04-17 MED ORDER — SODIUM CHLORIDE 0.9 % IV SOLN
Freq: Once | INTRAVENOUS | Status: AC
  Administered 2013-04-17: 11:00:00 via INTRAVENOUS

## 2013-04-17 MED ORDER — IRINOTECAN HCL CHEMO INJECTION 100 MG/5ML
130.0000 mg/m2 | Freq: Once | INTRAVENOUS | Status: AC
  Administered 2013-04-17: 220 mg via INTRAVENOUS
  Filled 2013-04-17: qty 11

## 2013-04-17 MED ORDER — ATROPINE SULFATE 0.4 MG/ML IJ SOLN
0.5000 mg | Freq: Once | INTRAMUSCULAR | Status: AC | PRN
  Administered 2013-04-17: 12:00:00 via INTRAVENOUS
  Filled 2013-04-17: qty 1.25

## 2013-04-17 MED ORDER — SODIUM CHLORIDE 0.9 % IV SOLN
10.0000 mg/kg | Freq: Once | INTRAVENOUS | Status: AC
  Administered 2013-04-17: 650 mg via INTRAVENOUS
  Filled 2013-04-17: qty 26

## 2013-04-17 MED ORDER — ONDANSETRON 8 MG/50ML IVPB (CHCC)
8.0000 mg | Freq: Once | INTRAVENOUS | Status: AC
  Administered 2013-04-17: 8 mg via INTRAVENOUS

## 2013-04-17 NOTE — Telephone Encounter (Signed)
gv adn printed appt sched and avs for pt,,,,emailed MW to add tx .Marland KitchenMarland Kitchenpt aware

## 2013-04-17 NOTE — Telephone Encounter (Signed)
Per staff message and POF I have scheduled appts.  JMW  

## 2013-04-17 NOTE — Progress Notes (Signed)
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 She underwent debulking surgery. She was initially treated on a clinical trial of Temodar, irinotecan and Avastin. Course was complicated by bilateral pulmonary emboli and diffuse bilateral interstitial pulmonary infiltrates, suspected pneumocystis pneumonia in April 2010. She was subsequently treated off protocol with cranial radiation and concomitant low-dose oral Temodar 06/05/2009 through 07/16/2009. She was then treated with maintenance Temodar 200 mg per meter square for 5 days each month. There was evidence of early progression while on Temodar in December 2010. Avastin was resumed at that time with disease stabilization. She completed 12 cycles of adjuvant Temodar plus Avastin 08/11/2010. She then began maintenance Avastin 15 mg per kilogram every 4 weeks.   Followup MRI of the brain done at [facility] on 08/29/2012 showed signs of progression. It was recommended to start her back on Temodar 80 mg daily and to increase Avastin back to 10 mg per kilogram IV every 2 weeks. She was treated with Avastin on 09/05/2012 and 09/19/2012.   We received a call from [place] on 09/22/2012 that the tumor was growing and they advised adding irinotecan 125 mg per meter squared with the next Avastin. Irinotecan was added on a 2 week schedule beginning 10/03/2012. Stable disease on MRI scan done at [facility] on 02/09/2013.  She is seen today for scheduled followup. She reports a recent appointment at [facility] with an MRI scan. The scan was reported to her as stable. She overall feels well. No falls. No usual headaches. Stable visual field cut lateral left eye. She tends to have mild nausea following each chemotherapy lasting less than 24 hours. No vomiting. She has intermittent loose stools. She takes Imodium as needed.  She recently noted mild gum bleeding when she brushed her teeth. No shortness of breath or chest pain. Stable leg swelling that improves with elevation. No calf pain.   Objective: Blood pressure 130/86, pulse 85, temperature 97 F (36.1 C), temperature source Oral, resp. rate 18, height 5\' 3"  (1.6 m), weight 141 lb 3.2 oz (64.048 kg).  PERRLA. Extraocular movements intact. Left lateral visual field cut. No palpable cervical or supraclavicular lymph nodes. Lungs clear. Regular cardiac rhythm. Abdomen soft and nontender. No organomegaly. Trace bilateral lower leg edema left greater than right. Calves nontender. Motor strength 5 over 5. Knee DTRs 2+, symmetric. Clumsiness with finger-to-nose on the left. Alert and oriented.  Lab Results: Lab Results  Component Value Date   WBC 5.1 04/17/2013   HGB 11.2* 04/17/2013   HCT 33.9* 04/17/2013   MCV 93.1 04/17/2013   PLT 299 04/17/2013    Chemistry:    Chemistry      Component Value Date/Time   NA 138 04/03/2013 1214   NA 134* 02/06/2013 1632   NA 138 03/20/2009 1454   K 4.2 04/03/2013 1214   K 3.9 02/06/2013 1632   K 3.8 03/20/2009 1454   CL 105 04/03/2013 1214   CL 101 02/06/2013 1632   CL 100 03/20/2009 1454   CO2 24 04/03/2013 1214   CO2 24 02/06/2013 1632   CO2 27 03/20/2009 1454   BUN 15.7 04/03/2013 1214   BUN 15 02/06/2013 1632   BUN 18 03/20/2009 1454   CREATININE 0.6 04/03/2013 1214   CREATININE 0.48* 02/06/2013 1632   CREATININE 0.51 11/28/2012 1401   CREATININE 0.6 03/20/2009 1454  Component Value Date/Time   CALCIUM 9.7 04/03/2013 1214   CALCIUM 9.7 02/06/2013 1632   CALCIUM 8.6 03/20/2009 1454   ALKPHOS 64 04/03/2013 1214   ALKPHOS 53 02/06/2013 1632   ALKPHOS 65 03/20/2009 1454   AST 21 04/03/2013 1214   AST 17 02/06/2013 1632   AST 14 03/20/2009 1454   ALT 23 04/03/2013 1214   ALT 15 02/06/2013 1632   BILITOT 0.27 04/03/2013 1214   BILITOT 0.1* 02/06/2013 1632   BILITOT 0.50 03/20/2009 1454       Studies/Results: No results found.  Medications:  I have reviewed the patient's current medications.  Assessment/Plan:  1. 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 She underwent debulking surgery. She was initially treated on a clinical trial with Temodar, irinotecan and Avastin. Course was complicated by development of bilateral pulmonary emboli and diffuse bilateral interstitial pulmonary infiltrates and suspected pneumocystis pneumonia April 2010. She was subsequently treated off protocol with cranial radiation with concomitant low-dose oral Temodar 07/21 through 07/16/2009. Subsequent maintenance Temodar 200 mg per meter squared 5 days each month. There was early progression while on Temodar December 2010. Avastin was added back at that time with disease stabilization. She completed 12 cycles of adjuvant Temodar plus Avastin 08/11/2010. Maintenance Avastin continued every 4 weeks. MRI of the brain at [facility] on 05/26/2012 with no evidence of disease progression. MRI of the brain at [facility] on 08/29/2012 with evidence of disease progression. Temodar resumed at a daily dose and Avastin change to 10 mg per kilogram IV every 2 weeks. Irinotecan added on a 2 week schedule beginning 10/03/2012. Restaging brain MRI 02/09/2013 with stable disease. We do not have the most recent brain MRI report reported to patient as stable. 2. Bilateral pulmonary emboli occurring in July 2010. Coumadin was discontinued following an office visit 10/17/2012. 3. History of bilateral interstitial pneumonia occurring around the time of initial diagnosis of GBM. She was treated empirically for Pneumocystis. 4. Asymmetric leg edema 11/04/2012. Bilateral venous Doppler negative for DVT. 5. Intermittent proteinuria related to Avastin. Urine protein measured at 100 mg/dL today. She had a 24-hour urine in December 2013 after the urine protein returned at 300  mg/dL. The 24-hour urine showed 572 mg of protein excretion. 6. Left lower extremity pain and swelling 02/06/2013. Negative venous duplex Doppler.  Disposition-Ms. Felling appears stable. Plan to continue current regimen of irinotecan and Avastin every 2 weeks and Temodar 80 mg daily. She will return for a followup visit in 6 weeks. She continues close followup at [facility].  She will contact the office prior to her next visit with any problems.  Ned Card ANP/GNP-BC

## 2013-04-20 ENCOUNTER — Encounter: Payer: Self-pay | Admitting: *Deleted

## 2013-04-20 NOTE — Progress Notes (Signed)
RECEIVED A FAX FROM [facility] CONCERNING A CONFIRMATION OF PRESCRIPTION SHIPMENT FOR TEMOZOLOMIDE ON 04/19/13.

## 2013-05-01 ENCOUNTER — Other Ambulatory Visit (HOSPITAL_BASED_OUTPATIENT_CLINIC_OR_DEPARTMENT_OTHER): Payer: Medicare Other | Admitting: Lab

## 2013-05-01 ENCOUNTER — Ambulatory Visit (HOSPITAL_BASED_OUTPATIENT_CLINIC_OR_DEPARTMENT_OTHER): Payer: Medicare Other

## 2013-05-01 VITALS — BP 131/83 | HR 74 | Temp 98.0°F

## 2013-05-01 DIAGNOSIS — C712 Malignant neoplasm of temporal lobe: Secondary | ICD-10-CM

## 2013-05-01 DIAGNOSIS — C713 Malignant neoplasm of parietal lobe: Secondary | ICD-10-CM

## 2013-05-01 DIAGNOSIS — C719 Malignant neoplasm of brain, unspecified: Secondary | ICD-10-CM

## 2013-05-01 DIAGNOSIS — Z5112 Encounter for antineoplastic immunotherapy: Secondary | ICD-10-CM

## 2013-05-01 DIAGNOSIS — C711 Malignant neoplasm of frontal lobe: Secondary | ICD-10-CM

## 2013-05-01 DIAGNOSIS — Z5111 Encounter for antineoplastic chemotherapy: Secondary | ICD-10-CM

## 2013-05-01 LAB — CBC WITH DIFFERENTIAL/PLATELET
BASO%: 0.4 % (ref 0.0–2.0)
EOS%: 2.8 % (ref 0.0–7.0)
HGB: 10.9 g/dL — ABNORMAL LOW (ref 11.6–15.9)
MCH: 31.2 pg (ref 25.1–34.0)
MCHC: 33.1 g/dL (ref 31.5–36.0)
MCV: 94.3 fL (ref 79.5–101.0)
MONO%: 14.1 % — ABNORMAL HIGH (ref 0.0–14.0)
RBC: 3.49 10*6/uL — ABNORMAL LOW (ref 3.70–5.45)
RDW: 16.8 % — ABNORMAL HIGH (ref 11.2–14.5)
lymph#: 1.3 10*3/uL (ref 0.9–3.3)
nRBC: 0 % (ref 0–0)

## 2013-05-01 LAB — COMPREHENSIVE METABOLIC PANEL (CC13)
AST: 18 U/L (ref 5–34)
Albumin: 3.4 g/dL — ABNORMAL LOW (ref 3.5–5.0)
Alkaline Phosphatase: 69 U/L (ref 40–150)
BUN: 19.5 mg/dL (ref 7.0–26.0)
Calcium: 9.3 mg/dL (ref 8.4–10.4)
Chloride: 107 mEq/L (ref 98–107)
Glucose: 87 mg/dl (ref 70–99)
Potassium: 4.3 mEq/L (ref 3.5–5.1)
Sodium: 140 mEq/L (ref 136–145)
Total Protein: 6.6 g/dL (ref 6.4–8.3)

## 2013-05-01 MED ORDER — ONDANSETRON 8 MG/50ML IVPB (CHCC)
8.0000 mg | Freq: Once | INTRAVENOUS | Status: AC
  Administered 2013-05-01: 8 mg via INTRAVENOUS

## 2013-05-01 MED ORDER — ATROPINE SULFATE 0.4 MG/ML IJ SOLN
0.5000 mg | Freq: Once | INTRAMUSCULAR | Status: AC | PRN
  Administered 2013-05-01: 0.5 mg via INTRAVENOUS
  Filled 2013-05-01: qty 1.25

## 2013-05-01 MED ORDER — SODIUM CHLORIDE 0.9 % IV SOLN
10.0000 mg/kg | Freq: Once | INTRAVENOUS | Status: AC
  Administered 2013-05-01: 650 mg via INTRAVENOUS
  Filled 2013-05-01: qty 26

## 2013-05-01 MED ORDER — IRINOTECAN HCL CHEMO INJECTION 100 MG/5ML
130.0000 mg/m2 | Freq: Once | INTRAVENOUS | Status: AC
  Administered 2013-05-01: 220 mg via INTRAVENOUS
  Filled 2013-05-01: qty 11

## 2013-05-01 MED ORDER — SODIUM CHLORIDE 0.9 % IV SOLN
Freq: Once | INTRAVENOUS | Status: AC
  Administered 2013-05-01: 13:00:00 via INTRAVENOUS

## 2013-05-01 NOTE — Patient Instructions (Signed)
[facility] Discharge Instructions for Patients Receiving Chemotherapy  Today you received the following chemotherapy agents Irinotecan/Avastin To help prevent nausea and vomiting after your treatment, we encourage you to take your nausea medication as prescribed.   If you develop nausea and vomiting that is not controlled by your nausea medication, call the clinic.   BELOW ARE SYMPTOMS THAT SHOULD BE REPORTED IMMEDIATELY:  *FEVER GREATER THAN 100.5 F  *CHILLS WITH OR WITHOUT FEVER  NAUSEA AND VOMITING THAT IS NOT CONTROLLED WITH YOUR NAUSEA MEDICATION  *UNUSUAL SHORTNESS OF BREATH  *UNUSUAL BRUISING OR BLEEDING  TENDERNESS IN MOUTH AND THROAT WITH OR WITHOUT PRESENCE OF ULCERS  *URINARY PROBLEMS  *BOWEL PROBLEMS  UNUSUAL RASH Items with * indicate a potential emergency and should be followed up as soon as possible.  Feel free to call the clinic you have any questions or concerns. The clinic phone number is [phone].    

## 2013-05-01 NOTE — Progress Notes (Signed)
Ok to treat with urine protein 1+ per Dr. Cyndie Chime.

## 2013-05-15 ENCOUNTER — Other Ambulatory Visit (HOSPITAL_BASED_OUTPATIENT_CLINIC_OR_DEPARTMENT_OTHER): Payer: Medicare Other | Admitting: Lab

## 2013-05-15 ENCOUNTER — Other Ambulatory Visit: Payer: Self-pay | Admitting: Oncology

## 2013-05-15 ENCOUNTER — Ambulatory Visit (HOSPITAL_BASED_OUTPATIENT_CLINIC_OR_DEPARTMENT_OTHER): Payer: Medicare Other

## 2013-05-15 VITALS — BP 150/86 | HR 82 | Temp 97.8°F

## 2013-05-15 DIAGNOSIS — Z5111 Encounter for antineoplastic chemotherapy: Secondary | ICD-10-CM

## 2013-05-15 DIAGNOSIS — C711 Malignant neoplasm of frontal lobe: Secondary | ICD-10-CM

## 2013-05-15 DIAGNOSIS — C712 Malignant neoplasm of temporal lobe: Secondary | ICD-10-CM

## 2013-05-15 DIAGNOSIS — Z5112 Encounter for antineoplastic immunotherapy: Secondary | ICD-10-CM

## 2013-05-15 DIAGNOSIS — C719 Malignant neoplasm of brain, unspecified: Secondary | ICD-10-CM

## 2013-05-15 DIAGNOSIS — C713 Malignant neoplasm of parietal lobe: Secondary | ICD-10-CM

## 2013-05-15 LAB — CBC WITH DIFFERENTIAL/PLATELET
BASO%: 0.7 % (ref 0.0–2.0)
Eosinophils Absolute: 0.1 10*3/uL (ref 0.0–0.5)
LYMPH%: 26.2 % (ref 14.0–49.7)
MCHC: 32.9 g/dL (ref 31.5–36.0)
MCV: 95.6 fL (ref 79.5–101.0)
MONO#: 0.7 10*3/uL (ref 0.1–0.9)
MONO%: 14.7 % — ABNORMAL HIGH (ref 0.0–14.0)
NEUT#: 2.6 10*3/uL (ref 1.5–6.5)
RBC: 3.4 10*6/uL — ABNORMAL LOW (ref 3.70–5.45)
RDW: 15.8 % — ABNORMAL HIGH (ref 11.2–14.5)
WBC: 4.6 10*3/uL (ref 3.9–10.3)
nRBC: 0 % (ref 0–0)

## 2013-05-15 MED ORDER — SODIUM CHLORIDE 0.9 % IV SOLN
Freq: Once | INTRAVENOUS | Status: AC
  Administered 2013-05-15: 12:00:00 via INTRAVENOUS

## 2013-05-15 MED ORDER — ATROPINE SULFATE 1 MG/ML IJ SOLN
0.5000 mg | Freq: Once | INTRAMUSCULAR | Status: AC | PRN
  Administered 2013-05-15: 0.5 mg via INTRAVENOUS

## 2013-05-15 MED ORDER — SODIUM CHLORIDE 0.9 % IV SOLN
10.0000 mg/kg | Freq: Once | INTRAVENOUS | Status: AC
  Administered 2013-05-15: 650 mg via INTRAVENOUS
  Filled 2013-05-15: qty 26

## 2013-05-15 MED ORDER — ONDANSETRON 8 MG/50ML IVPB (CHCC)
8.0000 mg | Freq: Once | INTRAVENOUS | Status: AC
  Administered 2013-05-15: 8 mg via INTRAVENOUS

## 2013-05-15 MED ORDER — IRINOTECAN HCL CHEMO INJECTION 100 MG/5ML
130.0000 mg/m2 | Freq: Once | INTRAVENOUS | Status: AC
  Administered 2013-05-15: 220 mg via INTRAVENOUS
  Filled 2013-05-15: qty 11

## 2013-05-15 NOTE — Patient Instructions (Addendum)
[facility] Discharge Instructions for Patients Receiving Chemotherapy  Today you received the following chemotherapy agents: irinotecan, avastin  To help prevent nausea and vomiting after your treatment, we encourage you to take your nausea medication.  Take it as often as prescribed.     If you develop nausea and vomiting that is not controlled by your nausea medication, call the clinic. If it is after clinic hours your family physician or the after hours number for the clinic or go to the Emergency Department.   BELOW ARE SYMPTOMS THAT SHOULD BE REPORTED IMMEDIATELY:  *FEVER GREATER THAN 100.5 F  *CHILLS WITH OR WITHOUT FEVER  NAUSEA AND VOMITING THAT IS NOT CONTROLLED WITH YOUR NAUSEA MEDICATION  *UNUSUAL SHORTNESS OF BREATH  *UNUSUAL BRUISING OR BLEEDING  TENDERNESS IN MOUTH AND THROAT WITH OR WITHOUT PRESENCE OF ULCERS  *URINARY PROBLEMS  *BOWEL PROBLEMS  UNUSUAL RASH Items with * indicate a potential emergency and should be followed up as soon as possible.  Feel free to call the clinic you have any questions or concerns. The clinic phone number is [phone].   I have been informed and understand all the instructions given to me. I know to contact the clinic, my physician, or go to the Emergency Department if any problems should occur. I do not have any questions at this time, but understand that I may call the clinic during office hours   should I have any questions or need assistance in obtaining follow up care.    __________________________________________  _____________  __________ Signature of Patient or Authorized Representative            Date                   Time    __________________________________________ Nurse's Signature

## 2013-05-18 ENCOUNTER — Encounter: Payer: Self-pay | Admitting: *Deleted

## 2013-05-18 NOTE — Progress Notes (Signed)
RECEIVED A FAX FROM [facility] CONCERNING A CONFIRMATION OF PRESCRIPTION SHIPMENT FOR TEMOZOLOMIDE ON 05/17/13.

## 2013-05-29 ENCOUNTER — Ambulatory Visit (HOSPITAL_BASED_OUTPATIENT_CLINIC_OR_DEPARTMENT_OTHER): Payer: Medicare Other | Admitting: Oncology

## 2013-05-29 ENCOUNTER — Ambulatory Visit (HOSPITAL_BASED_OUTPATIENT_CLINIC_OR_DEPARTMENT_OTHER): Payer: Medicare Other

## 2013-05-29 ENCOUNTER — Telehealth: Payer: Self-pay | Admitting: Oncology

## 2013-05-29 ENCOUNTER — Other Ambulatory Visit (HOSPITAL_BASED_OUTPATIENT_CLINIC_OR_DEPARTMENT_OTHER): Payer: Medicare Other

## 2013-05-29 VITALS — BP 130/86 | HR 72 | Temp 97.0°F | Resp 20 | Ht 63.0 in | Wt 137.5 lb

## 2013-05-29 DIAGNOSIS — C713 Malignant neoplasm of parietal lobe: Secondary | ICD-10-CM

## 2013-05-29 DIAGNOSIS — Z5112 Encounter for antineoplastic immunotherapy: Secondary | ICD-10-CM

## 2013-05-29 DIAGNOSIS — Z5111 Encounter for antineoplastic chemotherapy: Secondary | ICD-10-CM

## 2013-05-29 DIAGNOSIS — C711 Malignant neoplasm of frontal lobe: Secondary | ICD-10-CM

## 2013-05-29 DIAGNOSIS — C712 Malignant neoplasm of temporal lobe: Secondary | ICD-10-CM

## 2013-05-29 DIAGNOSIS — E039 Hypothyroidism, unspecified: Secondary | ICD-10-CM

## 2013-05-29 DIAGNOSIS — Z86718 Personal history of other venous thrombosis and embolism: Secondary | ICD-10-CM

## 2013-05-29 LAB — COMPREHENSIVE METABOLIC PANEL (CC13)
ALT: 20 U/L (ref 0–55)
AST: 19 U/L (ref 5–34)
CO2: 22 mEq/L (ref 22–29)
Chloride: 108 mEq/L (ref 98–109)
Creatinine: 0.6 mg/dL (ref 0.6–1.1)
Sodium: 139 mEq/L (ref 136–145)
Total Bilirubin: 0.21 mg/dL (ref 0.20–1.20)
Total Protein: 6.6 g/dL (ref 6.4–8.3)

## 2013-05-29 LAB — CBC WITH DIFFERENTIAL/PLATELET
Basophils Absolute: 0 10*3/uL (ref 0.0–0.1)
EOS%: 2.5 % (ref 0.0–7.0)
Eosinophils Absolute: 0.1 10*3/uL (ref 0.0–0.5)
HGB: 11.2 g/dL — ABNORMAL LOW (ref 11.6–15.9)
NEUT#: 2.2 10*3/uL (ref 1.5–6.5)
RBC: 3.57 10*6/uL — ABNORMAL LOW (ref 3.70–5.45)
RDW: 15.2 % — ABNORMAL HIGH (ref 11.2–14.5)
lymph#: 0.8 10*3/uL — ABNORMAL LOW (ref 0.9–3.3)
nRBC: 0 % (ref 0–0)

## 2013-05-29 LAB — UA PROTEIN, DIPSTICK - CHCC: Protein, ur: 100 mg/dL

## 2013-05-29 MED ORDER — ONDANSETRON 8 MG/50ML IVPB (CHCC)
8.0000 mg | Freq: Once | INTRAVENOUS | Status: AC
  Administered 2013-05-29: 8 mg via INTRAVENOUS

## 2013-05-29 MED ORDER — ATROPINE SULFATE 1 MG/ML IJ SOLN
0.5000 mg | Freq: Once | INTRAMUSCULAR | Status: AC | PRN
  Administered 2013-05-29: 13:00:00 via INTRAVENOUS

## 2013-05-29 MED ORDER — IRINOTECAN HCL CHEMO INJECTION 100 MG/5ML
130.0000 mg/m2 | Freq: Once | INTRAVENOUS | Status: AC
  Administered 2013-05-29: 220 mg via INTRAVENOUS
  Filled 2013-05-29: qty 11

## 2013-05-29 MED ORDER — SODIUM CHLORIDE 0.9 % IV SOLN
Freq: Once | INTRAVENOUS | Status: AC
  Administered 2013-05-29: 12:00:00 via INTRAVENOUS

## 2013-05-29 MED ORDER — SODIUM CHLORIDE 0.9 % IV SOLN
10.0000 mg/kg | Freq: Once | INTRAVENOUS | Status: AC
  Administered 2013-05-29: 650 mg via INTRAVENOUS
  Filled 2013-05-29: qty 26

## 2013-05-29 NOTE — Progress Notes (Signed)
OK to treat with Avastin despite urine protein=100, per Dr Cyndie Chime.

## 2013-05-29 NOTE — Telephone Encounter (Signed)
Gave pt appt  calendar for lab, Ml, and MD  for July and August 2014 emailed Marcelino Duster regarding chemo appts

## 2013-05-29 NOTE — Patient Instructions (Addendum)
[facility] Discharge Instructions for Patients Receiving Chemotherapy  Today you received the following chemotherapy agents Camptosar/Avastin.  To help prevent nausea and vomiting after your treatment, we encourage you to take your nausea medication as directed.   If you develop nausea and vomiting that is not controlled by your nausea medication, call the clinic.   BELOW ARE SYMPTOMS THAT SHOULD BE REPORTED IMMEDIATELY:  *FEVER GREATER THAN 100.5 F  *CHILLS WITH OR WITHOUT FEVER  NAUSEA AND VOMITING THAT IS NOT CONTROLLED WITH YOUR NAUSEA MEDICATION  *UNUSUAL SHORTNESS OF BREATH  *UNUSUAL BRUISING OR BLEEDING  TENDERNESS IN MOUTH AND THROAT WITH OR WITHOUT PRESENCE OF ULCERS  *URINARY PROBLEMS  *BOWEL PROBLEMS  UNUSUAL RASH Items with * indicate a potential emergency and should be followed up as soon as possible.  Feel free to call the clinic you have any questions or concerns. The clinic phone number is [phone].

## 2013-05-30 NOTE — Progress Notes (Signed)
Hematology and Oncology Follow Up Visit  Mercedes Barnes [id number] [birth year] 52 y.o. 05/30/2013 7:16 PM   Principle Diagnosis: Encounter Diagnoses  Name Primary?  . Glioblastoma multiforme of temporal lobe Yes  . 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 After a brief treatment on a clinical trial at [facility] , due to complications and further progression, she was treated with standard Temodar/radiation and achieved a excellent stabilization. She had a first progression in December 2010. She was put back on treatment with Temodar. She was far enough out from her pulmonary embolism and still on full dose anticoagulation and the [place] physicians felt it would be safe to put her on Avastin as well. She had a second progression in October 2013. A regimen of Temodar, Camptosar, and Avastin was recommended and initiated in November 2013. She has tolerated this combination extremely well with minimal myelosuppression and no other major toxicities. She has some mild, intermittent, proteinuria on the Avastin and we have rarely had to hold a dose. She has had stable disease on serial two-month scans done at [facility] including most recent study done on 02/09/2013. There still residual tumor in the area of the basal ganglia.  Clinically she remains stable. She still has a peripheral vision field cut on the left which started at time of most recent progression. It has not improved over time. No response to a trial of Decadron in the past. She reports no confusion and this is corroborated by her husband. Gait remains unsteady primarily due to her visual defect. No seizure activity. No focal weakness or slurred speech. No interim infections.   Medications: reviewed  Allergies:  Allergies  Allergen  Reactions  . Co-Trimoxazole Injection (Sulfamethoxazole-Trimethoprim) Hives  . Pentamidine     Hypotension & weakness    Review of Systems: Constitutional:   No constitutional symptoms Respiratory: No cough or dyspnea Cardiovascular:  No chest pain or palpitations Gastrointestinal: No change in bowel habit Genito-Urinary: No recent infection Musculoskeletal: No muscle bone or joint pain Neurologic: See above Skin: No rash Remaining ROS negative.  Physical Exam: Blood pressure 130/86, pulse 72, temperature 97 F (36.1 C), resp. rate 20, height 5\' 3"  (1.6 m), weight 137 lb 8 oz (62.37 kg). Wt Readings from Last 3 Encounters:  05/29/13 137 lb 8 oz (62.37 kg)  04/17/13 141 lb 3.2 oz (64.048 kg)  03/06/13 137 lb 11.2 oz (62.46 kg)     General appearance: Well-nourished Caucasian woman [name]: Pharynx no erythema exudate or ulcer Lymph nodes: No adenopathy Breasts: Lungs: Clear to auscultation resonant to percussion Heart: Regular rhythm no murmur Abdomen: Soft, nontender, no mass, no organomegaly Extremities: No edema, no calf tenderness Musculoskeletal: No joint deformities GU: Vascular: No carotid bruits, no cyanosis Neurologic: Alert and oriented, she could spell simple words backwards with disease, PERRLA, optic disc sharp and vessels normal, no hemorrhage or exudate, motor strength 5 over 5, reflexes 1+ symmetric, cranial nerves grossly normal. Formal visual testing not done. Known peripheral vision cut on the left. Finger to finger exam clumsy on the left hand which is a chronic finding. Finger to nose normal. Gait is hesitant but not ataxic. Skin: No rash or ecchymosis  Lab Results: Lab Results  Component Value Date   WBC 4.1 05/29/2013   HGB 11.2*  05/29/2013   HCT 34.2* 05/29/2013   MCV 95.8 05/29/2013   PLT 294 05/29/2013     Chemistry      Component Value Date/Time   NA 139 05/29/2013 0953   NA 134* 02/06/2013 1632   NA 138 03/20/2009 1454   K 4.3 05/29/2013 0953   K  3.9 02/06/2013 1632   K 3.8 03/20/2009 1454   CL 107 05/01/2013 1241   CL 101 02/06/2013 1632   CL 100 03/20/2009 1454   CO2 22 05/29/2013 0953   CO2 24 02/06/2013 1632   CO2 27 03/20/2009 1454   BUN 18.2 05/29/2013 0953   BUN 15 02/06/2013 1632   BUN 18 03/20/2009 1454   CREATININE 0.6 05/29/2013 0953   CREATININE 0.48* 02/06/2013 1632   CREATININE 0.51 11/28/2012 1401   CREATININE 0.6 03/20/2009 1454      Component Value Date/Time   CALCIUM 9.5 05/29/2013 0953   CALCIUM 9.7 02/06/2013 1632   CALCIUM 8.6 03/20/2009 1454   ALKPHOS 65 05/29/2013 0953   ALKPHOS 53 02/06/2013 1632   ALKPHOS 65 03/20/2009 1454   AST 19 05/29/2013 0953   AST 17 02/06/2013 1632   AST 14 03/20/2009 1454   ALT 20 05/29/2013 0953   ALT 15 02/06/2013 1632   BILITOT 0.21 05/29/2013 0953   BILITOT 0.1* 02/06/2013 1632   BILITOT 0.50 03/20/2009 1454       Radiological Studies: Most recent MRI brain 02/09/2013 results as noted above.   Impression: #1. Relapsed cystic glioblastoma  Stable disease now out over 4 years from original diagnosis in April 2010 and 10 months from progression in October 2013.Marland Kitchen Plan: Continue current regimen Temodar 80 mg daily, Avastin 10 mg per kilogram IV Q2 weeks, and Camptosar 130 mg per meter squared IV every 2 weeks.     #2. Pulmonary embolus shortly after diagnosis in July 2010. Subsequent left lower extremity DVT.  Currently not on anticoagulation.   #3. 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      CC:.    Annia Belt, MD 7/15/20147:16 PM

## 2013-05-31 ENCOUNTER — Other Ambulatory Visit: Payer: Self-pay | Admitting: *Deleted

## 2013-05-31 DIAGNOSIS — C712 Malignant neoplasm of temporal lobe: Secondary | ICD-10-CM

## 2013-05-31 MED ORDER — TEMOZOLOMIDE 20 MG PO CAPS: 80.0000 mg | ORAL_CAPSULE | Freq: Every day | ORAL | Status: DC

## 2013-06-08 ENCOUNTER — Telehealth: Payer: Self-pay | Admitting: Oncology

## 2013-06-08 NOTE — Telephone Encounter (Signed)
Gave pt appt for lab,MD and chemo August 2014

## 2013-06-12 ENCOUNTER — Ambulatory Visit (HOSPITAL_BASED_OUTPATIENT_CLINIC_OR_DEPARTMENT_OTHER): Payer: Medicare Other

## 2013-06-12 ENCOUNTER — Other Ambulatory Visit (HOSPITAL_BASED_OUTPATIENT_CLINIC_OR_DEPARTMENT_OTHER): Payer: Medicare Other | Admitting: Lab

## 2013-06-12 ENCOUNTER — Telehealth: Payer: Self-pay | Admitting: *Deleted

## 2013-06-12 VITALS — BP 141/82 | HR 84 | Temp 97.4°F | Resp 18

## 2013-06-12 DIAGNOSIS — C712 Malignant neoplasm of temporal lobe: Secondary | ICD-10-CM

## 2013-06-12 DIAGNOSIS — Z5112 Encounter for antineoplastic immunotherapy: Secondary | ICD-10-CM

## 2013-06-12 DIAGNOSIS — C713 Malignant neoplasm of parietal lobe: Secondary | ICD-10-CM

## 2013-06-12 DIAGNOSIS — C711 Malignant neoplasm of frontal lobe: Secondary | ICD-10-CM

## 2013-06-12 DIAGNOSIS — C719 Malignant neoplasm of brain, unspecified: Secondary | ICD-10-CM

## 2013-06-12 DIAGNOSIS — Z5111 Encounter for antineoplastic chemotherapy: Secondary | ICD-10-CM

## 2013-06-12 LAB — CBC WITH DIFFERENTIAL/PLATELET
BASO%: 0.2 % (ref 0.0–2.0)
LYMPH%: 28.7 % (ref 14.0–49.7)
MCHC: 32.5 g/dL (ref 31.5–36.0)
MONO#: 0.8 10*3/uL (ref 0.1–0.9)
Platelets: 280 10*3/uL (ref 145–400)
RBC: 3.36 10*6/uL — ABNORMAL LOW (ref 3.70–5.45)
RDW: 15.1 % — ABNORMAL HIGH (ref 11.2–14.5)
WBC: 4.1 10*3/uL (ref 3.9–10.3)

## 2013-06-12 LAB — UA PROTEIN, DIPSTICK - CHCC: Protein, ur: 100 mg/dL

## 2013-06-12 MED ORDER — ATROPINE SULFATE 1 MG/ML IJ SOLN
0.5000 mg | Freq: Once | INTRAMUSCULAR | Status: AC | PRN
  Administered 2013-06-12: 0.5 mg via INTRAVENOUS

## 2013-06-12 MED ORDER — IRINOTECAN HCL CHEMO INJECTION 100 MG/5ML
130.0000 mg/m2 | Freq: Once | INTRAVENOUS | Status: AC
  Administered 2013-06-12: 220 mg via INTRAVENOUS
  Filled 2013-06-12: qty 11

## 2013-06-12 MED ORDER — SODIUM CHLORIDE 0.9 % IV SOLN
10.0000 mg/kg | Freq: Once | INTRAVENOUS | Status: AC
  Administered 2013-06-12: 650 mg via INTRAVENOUS
  Filled 2013-06-12: qty 26

## 2013-06-12 MED ORDER — ONDANSETRON 8 MG/50ML IVPB (CHCC)
8.0000 mg | Freq: Once | INTRAVENOUS | Status: AC
  Administered 2013-06-12: 8 mg via INTRAVENOUS

## 2013-06-12 MED ORDER — SODIUM CHLORIDE 0.9 % IV SOLN
Freq: Once | INTRAVENOUS | Status: AC
  Administered 2013-06-12: 12:00:00 via INTRAVENOUS

## 2013-06-12 NOTE — Telephone Encounter (Signed)
Per patient's husband she is to have more treatments scheduled. I have looked at the POF I have scheduled appts

## 2013-06-12 NOTE — Progress Notes (Signed)
Ok to treat with Avastin with urine protein of 100 per Dr. Cyndie Chime.

## 2013-06-12 NOTE — Patient Instructions (Addendum)
[facility] Discharge Instructions for Patients Receiving Chemotherapy  Today you received the following chemotherapy agents Avastin/Camptosar.  To help prevent nausea and vomiting after your treatment, we encourage you to take your nausea medication as prescribed.   If you develop nausea and vomiting that is not controlled by your nausea medication, call the clinic.   BELOW ARE SYMPTOMS THAT SHOULD BE REPORTED IMMEDIATELY:  *FEVER GREATER THAN 100.5 F  *CHILLS WITH OR WITHOUT FEVER  NAUSEA AND VOMITING THAT IS NOT CONTROLLED WITH YOUR NAUSEA MEDICATION  *UNUSUAL SHORTNESS OF BREATH  *UNUSUAL BRUISING OR BLEEDING  TENDERNESS IN MOUTH AND THROAT WITH OR WITHOUT PRESENCE OF ULCERS  *URINARY PROBLEMS  *BOWEL PROBLEMS  UNUSUAL RASH Items with * indicate a potential emergency and should be followed up as soon as possible.  Feel free to call the clinic you have any questions or concerns. The clinic phone number is [phone].

## 2013-06-15 ENCOUNTER — Telehealth: Payer: Self-pay | Admitting: *Deleted

## 2013-06-15 NOTE — Telephone Encounter (Signed)
Received call from Jill/NP/[facility] stating that pt was seen there today & states that she has recently had more issues with nausea.  Noreene Larsson thought that this may be some anticipatory nausea & has given her a script for ativan to take 30 min before treatment.  She wanted to know what pt has been receiving antiemetic-wise.  Informed that she received zofran & atropine.  Noreene Larsson suggested some aloxi for better coverage & will include this in her note to Dr Cyndie Chime.  This note also relayed to Dr Cyndie Chime.

## 2013-06-19 ENCOUNTER — Other Ambulatory Visit: Payer: Self-pay | Admitting: Oncology

## 2013-06-20 NOTE — Telephone Encounter (Signed)
[facility] sent facsimile confirmation of Temozolomide prescription shipment.  temodar was shipped on 06-19-2013 with next business day delivery.

## 2013-06-26 ENCOUNTER — Telehealth: Payer: Self-pay | Admitting: Oncology

## 2013-06-26 ENCOUNTER — Ambulatory Visit (HOSPITAL_BASED_OUTPATIENT_CLINIC_OR_DEPARTMENT_OTHER): Payer: Medicare Other | Admitting: Nurse Practitioner

## 2013-06-26 ENCOUNTER — Ambulatory Visit (HOSPITAL_BASED_OUTPATIENT_CLINIC_OR_DEPARTMENT_OTHER): Payer: Medicare Other

## 2013-06-26 ENCOUNTER — Other Ambulatory Visit (HOSPITAL_BASED_OUTPATIENT_CLINIC_OR_DEPARTMENT_OTHER): Payer: Medicare Other | Admitting: Lab

## 2013-06-26 VITALS — BP 128/84 | HR 77 | Temp 97.5°F | Resp 18 | Ht 63.0 in | Wt 134.5 lb

## 2013-06-26 DIAGNOSIS — C712 Malignant neoplasm of temporal lobe: Secondary | ICD-10-CM

## 2013-06-26 DIAGNOSIS — C711 Malignant neoplasm of frontal lobe: Secondary | ICD-10-CM

## 2013-06-26 DIAGNOSIS — Z5112 Encounter for antineoplastic immunotherapy: Secondary | ICD-10-CM

## 2013-06-26 DIAGNOSIS — C713 Malignant neoplasm of parietal lobe: Secondary | ICD-10-CM

## 2013-06-26 DIAGNOSIS — Z5111 Encounter for antineoplastic chemotherapy: Secondary | ICD-10-CM

## 2013-06-26 DIAGNOSIS — C719 Malignant neoplasm of brain, unspecified: Secondary | ICD-10-CM

## 2013-06-26 DIAGNOSIS — R110 Nausea: Secondary | ICD-10-CM

## 2013-06-26 LAB — COMPREHENSIVE METABOLIC PANEL (CC13)
ALT: 17 U/L (ref 0–55)
AST: 18 U/L (ref 5–34)
Calcium: 9.3 mg/dL (ref 8.4–10.4)
Chloride: 110 mEq/L — ABNORMAL HIGH (ref 98–109)
Creatinine: 0.7 mg/dL (ref 0.6–1.1)
Sodium: 141 mEq/L (ref 136–145)
Total Bilirubin: <0.2 mg/dL (ref 0.20–1.20)
Total Protein: 6.7 g/dL (ref 6.4–8.3)

## 2013-06-26 LAB — CBC WITH DIFFERENTIAL/PLATELET
BASO%: 0.3 % (ref 0.0–2.0)
EOS%: 3.5 % (ref 0.0–7.0)
HCT: 34 % — ABNORMAL LOW (ref 34.8–46.6)
MCH: 30.9 pg (ref 25.1–34.0)
MCHC: 32.6 g/dL (ref 31.5–36.0)
MONO#: 0.9 10*3/uL (ref 0.1–0.9)
NEUT%: 55.1 % (ref 38.4–76.8)
RBC: 3.59 10*6/uL — ABNORMAL LOW (ref 3.70–5.45)
WBC: 4 10*3/uL (ref 3.9–10.3)
lymph#: 0.8 10*3/uL — ABNORMAL LOW (ref 0.9–3.3)

## 2013-06-26 MED ORDER — ATROPINE SULFATE 1 MG/ML IJ SOLN: 0.5000 mg | Freq: Once | INTRAMUSCULAR | Status: DC | PRN

## 2013-06-26 MED ORDER — SODIUM CHLORIDE 0.9 % IV SOLN
Freq: Once | INTRAVENOUS | Status: AC
  Administered 2013-06-26: 11:00:00 via INTRAVENOUS

## 2013-06-26 MED ORDER — IRINOTECAN HCL CHEMO INJECTION 100 MG/5ML
130.0000 mg/m2 | Freq: Once | INTRAVENOUS | Status: AC
  Administered 2013-06-26: 220 mg via INTRAVENOUS
  Filled 2013-06-26: qty 11

## 2013-06-26 MED ORDER — SODIUM CHLORIDE 0.9 % IV SOLN
10.0000 mg/kg | Freq: Once | INTRAVENOUS | Status: AC
  Administered 2013-06-26: 650 mg via INTRAVENOUS
  Filled 2013-06-26: qty 26

## 2013-06-26 MED ORDER — PALONOSETRON HCL INJECTION 0.25 MG/5ML
0.2500 mg | Freq: Once | INTRAVENOUS | Status: AC
  Administered 2013-06-26: 0.25 mg via INTRAVENOUS

## 2013-06-26 NOTE — Patient Instructions (Addendum)
[facility] Discharge Instructions for Patients Receiving Chemotherapy  Today you received the following chemotherapy agents Avastin/Camptosar.  To help prevent nausea and vomiting after your treatment, we encourage you to take your nausea medication as needed.   If you develop nausea and vomiting that is not controlled by your nausea medication, call the clinic.   BELOW ARE SYMPTOMS THAT SHOULD BE REPORTED IMMEDIATELY:  *FEVER GREATER THAN 100.5 F  *CHILLS WITH OR WITHOUT FEVER  NAUSEA AND VOMITING THAT IS NOT CONTROLLED WITH YOUR NAUSEA MEDICATION  *UNUSUAL SHORTNESS OF BREATH  *UNUSUAL BRUISING OR BLEEDING  TENDERNESS IN MOUTH AND THROAT WITH OR WITHOUT PRESENCE OF ULCERS  *URINARY PROBLEMS  *BOWEL PROBLEMS  UNUSUAL RASH Items with * indicate a potential emergency and should be followed up as soon as possible.  Feel free to call the clinic you have any questions or concerns. The clinic phone number is [phone].

## 2013-06-26 NOTE — Telephone Encounter (Signed)
Gave pt appt for lab, ML and chemo for August and September 2015

## 2013-06-26 NOTE — Progress Notes (Signed)
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 She underwent debulking surgery. She was initially treated on a clinical trial of Temodar, irinotecan and Avastin. Course was complicated by bilateral pulmonary emboli and diffuse bilateral interstitial pulmonary infiltrates, suspected pneumocystis pneumonia in April 2010. She was subsequently treated off protocol with cranial radiation and concomitant low-dose oral Temodar 06/05/2009 through 07/16/2009. She was then treated with maintenance Temodar 200 mg per meter square for 5 days each month. There was evidence of early progression while on Temodar in December 2010. Avastin was resumed at that time with disease stabilization. She completed 12 cycles of adjuvant Temodar plus Avastin 08/11/2010. She then began maintenance Avastin 15 mg per kilogram every 4 weeks.   Followup MRI of the brain done at [facility] on 08/29/2012 showed signs of progression. It was recommended to start her back on Temodar 80 mg daily and to increase Avastin back to 10 mg per kilogram IV every 2 weeks. She was treated with Avastin on 09/05/2012 and 09/19/2012.   We received a call from [facility] on 09/22/2012 that the tumor was growing and they advised adding irinotecan 125 mg per meter squared with the next Avastin. Irinotecan was added on a 2 week schedule beginning 10/03/2012. Stable disease on MRI scan done at [facility] on 02/09/2013.  She reports a visit and scan at [facility] approximately 2 weeks ago. The scan showed "improvement". Recommendation was to continue the current treatment. She was given a prescription for Ativan to take 30 minutes prior to treatment due to concern for anticipatory nausea. She had single episode of nausea/vomiting following the last 2 treatments. She is having intermittent loose stools. The loose stools  did not require an antidiarrheal. She has occasional headaches. Stable visual deficit with peripheral field cut on the left. No falls. She continues to note an unsteady gait. No fever, cough, shortness of breath.   Objective: Blood pressure 128/84, pulse 77, temperature 97.5 F (36.4 C), temperature source Oral, resp. rate 18, height 5\' 3"  (1.6 m), weight 134 lb 8 oz (61.009 kg), SpO2 100.00%.  PERRLA. Oropharynx is without thrush or ulceration. Lungs are clear. Regular cardiac rhythm. Abdomen soft and nontender. No organomegaly. Trace lower leg edema bilaterally left slightly more than right. Calves soft and nontender. Motor strength 5 over 5. Knee DTRs 2+, symmetric. Finger to nose testing is normal. Rapid alternating movement clumsy on the left hand.  Lab Results: Lab Results  Component Value Date   WBC 4.0 06/26/2013   HGB 11.1* 06/26/2013   HCT 34.0* 06/26/2013   MCV 94.7 06/26/2013   PLT 309 06/26/2013    Chemistry:    Chemistry      Component Value Date/Time   NA 139 05/29/2013 0953   NA 134* 02/06/2013 1632   NA 138 03/20/2009 1454   K 4.3 05/29/2013 0953   K 3.9 02/06/2013 1632   K 3.8 03/20/2009 1454   CL 107 05/01/2013 1241   CL 101 02/06/2013 1632   CL 100 03/20/2009 1454   CO2 22 05/29/2013 0953   CO2 24 02/06/2013 1632   CO2 27 03/20/2009 1454   BUN 18.2 05/29/2013 0953   BUN 15 02/06/2013 1632   BUN 18 03/20/2009 1454   CREATININE 0.6 05/29/2013 0953   CREATININE 0.48* 02/06/2013 1632   CREATININE 0.51 11/28/2012 1401   CREATININE 0.6  03/20/2009 1454      Component Value Date/Time   CALCIUM 9.5 05/29/2013 0953   CALCIUM 9.7 02/06/2013 1632   CALCIUM 8.6 03/20/2009 1454   ALKPHOS 65 05/29/2013 0953   ALKPHOS 53 02/06/2013 1632   ALKPHOS 65 03/20/2009 1454   AST 19 05/29/2013 0953   AST 17 02/06/2013 1632   AST 14 03/20/2009 1454   ALT 20 05/29/2013 0953   ALT 15 02/06/2013 1632   ALT 21 03/20/2009 1454   BILITOT 0.21 05/29/2013 0953   BILITOT 0.1* 02/06/2013 1632   BILITOT 0.50 03/20/2009 1454        Studies/Results: No results found.  Medications: I have reviewed the patient's current medications.  Assessment/Plan:  1. 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 She underwent debulking surgery. She was initially treated on a clinical trial with Temodar, irinotecan and Avastin. Course was complicated by development of bilateral pulmonary emboli and diffuse bilateral interstitial pulmonary infiltrates and suspected pneumocystis pneumonia April 2010. She was subsequently treated off protocol with cranial radiation with concomitant low-dose oral Temodar 07/21 through 07/16/2009. Subsequent maintenance Temodar 200 mg per meter squared 5 days each month. There was early progression while on Temodar December 2010. Avastin was added back at that time with disease stabilization. She completed 12 cycles of adjuvant Temodar plus Avastin 08/11/2010. Maintenance Avastin continued every 4 weeks. MRI of the brain at [facility] on 05/26/2012 with no evidence of disease progression. MRI of the brain at [facility] on 08/29/2012 with evidence of disease progression. Temodar resumed at a daily dose and Avastin change to 10 mg per kilogram IV every 2 weeks. Irinotecan added on a 2 week schedule beginning 10/03/2012. Restaging brain MRI 02/09/2013 with stable disease. Most recent study approximately 2 weeks ago reported to patient as improved. 2. Bilateral pulmonary emboli occurring in July 2010. Coumadin was discontinued following an office visit 10/17/2012. 3. History of bilateral interstitial pneumonia occurring around the time of initial diagnosis of GBM. She was treated empirically for Pneumocystis. 4. Asymmetric leg edema 11/04/2012. Bilateral venous Doppler negative for DVT. 5. Intermittent proteinuria related to Avastin. Urine protein measured at 100 mg/dL today. She had a 24-hour  urine in December 2013 after the urine protein returned at 300 mg/dL. The 24-hour urine showed 572 mg of protein excretion. 6. Left lower extremity pain and swelling 02/06/2013. Negative venous duplex Doppler.  Disposition-she appears stable. Plan to continue current regimen of Temodar 80 mg daily, Avastin 10 mg per kilogram IV every 2 weeks and Camptosar 130 mg per meter squared IV every 2 weeks. Premedication regimen adjusted to include Aloxi. She also has Ativan for nausea.  She will return for a followup visit with Dr. Beryle Beams on 07/24/2013. She will contact the office in the interim with any problems.  Plan reviewed with Dr. Beryle Beams.  Ned Card ANP/GNP-BC

## 2013-06-26 NOTE — Telephone Encounter (Signed)
Gave pt appt for lab, ML and chemo for August and September 2015 °

## 2013-06-27 ENCOUNTER — Telehealth: Payer: Self-pay | Admitting: Oncology

## 2013-06-27 NOTE — Telephone Encounter (Signed)
talked to pt's husband he is aware of appt for lab, MD and chemo for August and September 2014

## 2013-07-10 ENCOUNTER — Ambulatory Visit (HOSPITAL_BASED_OUTPATIENT_CLINIC_OR_DEPARTMENT_OTHER): Payer: Medicare Other

## 2013-07-10 ENCOUNTER — Other Ambulatory Visit: Payer: Self-pay | Admitting: Nurse Practitioner

## 2013-07-10 ENCOUNTER — Other Ambulatory Visit (HOSPITAL_BASED_OUTPATIENT_CLINIC_OR_DEPARTMENT_OTHER): Payer: Medicare Other | Admitting: Lab

## 2013-07-10 VITALS — BP 143/94 | HR 78 | Temp 96.8°F | Resp 16

## 2013-07-10 DIAGNOSIS — Z5112 Encounter for antineoplastic immunotherapy: Secondary | ICD-10-CM

## 2013-07-10 DIAGNOSIS — Z5111 Encounter for antineoplastic chemotherapy: Secondary | ICD-10-CM

## 2013-07-10 DIAGNOSIS — C713 Malignant neoplasm of parietal lobe: Secondary | ICD-10-CM

## 2013-07-10 DIAGNOSIS — C712 Malignant neoplasm of temporal lobe: Secondary | ICD-10-CM

## 2013-07-10 DIAGNOSIS — C719 Malignant neoplasm of brain, unspecified: Secondary | ICD-10-CM

## 2013-07-10 DIAGNOSIS — C711 Malignant neoplasm of frontal lobe: Secondary | ICD-10-CM

## 2013-07-10 DIAGNOSIS — E039 Hypothyroidism, unspecified: Secondary | ICD-10-CM

## 2013-07-10 LAB — CBC WITH DIFFERENTIAL/PLATELET
Eosinophils Absolute: 0.1 10*3/uL (ref 0.0–0.5)
MONO#: 0.8 10*3/uL (ref 0.1–0.9)
NEUT#: 2.8 10*3/uL (ref 1.5–6.5)
RBC: 3.57 10*6/uL — ABNORMAL LOW (ref 3.70–5.45)
RDW: 15.1 % — ABNORMAL HIGH (ref 11.2–14.5)
WBC: 4.7 10*3/uL (ref 3.9–10.3)
lymph#: 1 10*3/uL (ref 0.9–3.3)
nRBC: 0 % (ref 0–0)

## 2013-07-10 LAB — UA PROTEIN, DIPSTICK - CHCC: Protein, ur: 30 mg/dL

## 2013-07-10 LAB — T4, FREE: Free T4: 1.57 ng/dL (ref 0.80–1.80)

## 2013-07-10 MED ORDER — IRINOTECAN HCL CHEMO INJECTION 100 MG/5ML
130.0000 mg/m2 | Freq: Once | INTRAVENOUS | Status: AC
  Administered 2013-07-10: 220 mg via INTRAVENOUS
  Filled 2013-07-10: qty 11

## 2013-07-10 MED ORDER — ATROPINE SULFATE 1 MG/ML IJ SOLN
0.5000 mg | Freq: Once | INTRAMUSCULAR | Status: AC | PRN
  Administered 2013-07-10: 0.5 mg via INTRAVENOUS

## 2013-07-10 MED ORDER — SODIUM CHLORIDE 0.9 % IV SOLN
10.0000 mg/kg | Freq: Once | INTRAVENOUS | Status: AC
  Administered 2013-07-10: 650 mg via INTRAVENOUS
  Filled 2013-07-10: qty 26

## 2013-07-10 MED ORDER — PALONOSETRON HCL INJECTION 0.25 MG/5ML
0.2500 mg | Freq: Once | INTRAVENOUS | Status: AC
  Administered 2013-07-10: 0.25 mg via INTRAVENOUS

## 2013-07-10 MED ORDER — SODIUM CHLORIDE 0.9 % IV SOLN
Freq: Once | INTRAVENOUS | Status: AC
  Administered 2013-07-10: 13:00:00 via INTRAVENOUS

## 2013-07-10 NOTE — Patient Instructions (Addendum)
[facility] Discharge Instructions for Patients Receiving Chemotherapy  Today you received the following chemotherapy agents AVASTIN, IRINOTECAN  Pt had ALOXI so she will not need her ondansetron(Zofran) for 2-3 days for nausea.   If you develop nausea and vomiting that is not controlled by your nausea medication, call the clinic.   BELOW ARE SYMPTOMS THAT SHOULD BE REPORTED IMMEDIATELY:  *FEVER GREATER THAN 100.5 F  *CHILLS WITH OR WITHOUT FEVER  NAUSEA AND VOMITING THAT IS NOT CONTROLLED WITH YOUR NAUSEA MEDICATION  *UNUSUAL SHORTNESS OF BREATH  *UNUSUAL BRUISING OR BLEEDING  TENDERNESS IN MOUTH AND THROAT WITH OR WITHOUT PRESENCE OF ULCERS  *URINARY PROBLEMS  *BOWEL PROBLEMS  UNUSUAL RASH Items with * indicate a potential emergency and should be followed up as soon as possible.  Feel free to call the clinic you have any questions or concerns. The clinic phone number is [phone].

## 2013-07-17 ENCOUNTER — Other Ambulatory Visit: Payer: Self-pay | Admitting: Oncology

## 2013-07-20 ENCOUNTER — Telehealth: Payer: Self-pay | Admitting: *Deleted

## 2013-07-20 NOTE — Telephone Encounter (Signed)
Received confirmation fax from [employer] stating temozolomide was shipped 07/19/13 for delivery next business day.

## 2013-07-24 ENCOUNTER — Telehealth: Payer: Self-pay | Admitting: *Deleted

## 2013-07-24 ENCOUNTER — Ambulatory Visit (HOSPITAL_BASED_OUTPATIENT_CLINIC_OR_DEPARTMENT_OTHER): Payer: Medicare Other | Admitting: Oncology

## 2013-07-24 ENCOUNTER — Ambulatory Visit (HOSPITAL_BASED_OUTPATIENT_CLINIC_OR_DEPARTMENT_OTHER): Payer: Medicare Other

## 2013-07-24 ENCOUNTER — Other Ambulatory Visit: Payer: Self-pay | Admitting: Certified Registered Nurse Anesthetist

## 2013-07-24 ENCOUNTER — Other Ambulatory Visit (HOSPITAL_BASED_OUTPATIENT_CLINIC_OR_DEPARTMENT_OTHER): Payer: Medicare Other | Admitting: Lab

## 2013-07-24 VITALS — BP 138/88

## 2013-07-24 VITALS — BP 134/88 | HR 79 | Temp 97.0°F | Resp 20 | Ht 63.0 in | Wt 135.0 lb

## 2013-07-24 DIAGNOSIS — C713 Malignant neoplasm of parietal lobe: Secondary | ICD-10-CM

## 2013-07-24 DIAGNOSIS — C712 Malignant neoplasm of temporal lobe: Secondary | ICD-10-CM

## 2013-07-24 DIAGNOSIS — C711 Malignant neoplasm of frontal lobe: Secondary | ICD-10-CM

## 2013-07-24 DIAGNOSIS — Z86711 Personal history of pulmonary embolism: Secondary | ICD-10-CM

## 2013-07-24 DIAGNOSIS — Z5112 Encounter for antineoplastic immunotherapy: Secondary | ICD-10-CM

## 2013-07-24 DIAGNOSIS — Z86718 Personal history of other venous thrombosis and embolism: Secondary | ICD-10-CM

## 2013-07-24 DIAGNOSIS — C719 Malignant neoplasm of brain, unspecified: Secondary | ICD-10-CM

## 2013-07-24 DIAGNOSIS — Z5111 Encounter for antineoplastic chemotherapy: Secondary | ICD-10-CM

## 2013-07-24 LAB — CBC WITH DIFFERENTIAL/PLATELET
EOS%: 3.9 % (ref 0.0–7.0)
Eosinophils Absolute: 0.1 10*3/uL (ref 0.0–0.5)
LYMPH%: 28.4 % (ref 14.0–49.7)
MCH: 30.7 pg (ref 25.1–34.0)
MCHC: 32.8 g/dL (ref 31.5–36.0)
MCV: 93.6 fL (ref 79.5–101.0)
MONO%: 13.8 % (ref 0.0–14.0)
Platelets: 296 10*3/uL (ref 145–400)
RBC: 3.58 10*6/uL — ABNORMAL LOW (ref 3.70–5.45)
RDW: 15.3 % — ABNORMAL HIGH (ref 11.2–14.5)
nRBC: 0 % (ref 0–0)

## 2013-07-24 LAB — COMPREHENSIVE METABOLIC PANEL (CC13)
ALT: 19 U/L (ref 0–55)
Alkaline Phosphatase: 67 U/L (ref 40–150)
CO2: 25 mEq/L (ref 22–29)
Creatinine: 0.7 mg/dL (ref 0.6–1.1)
Sodium: 142 mEq/L (ref 136–145)
Total Bilirubin: <0.2 mg/dL (ref 0.20–1.20)
Total Protein: 6.1 g/dL — ABNORMAL LOW (ref 6.4–8.3)

## 2013-07-24 MED ORDER — IRINOTECAN HCL CHEMO INJECTION 100 MG/5ML
130.0000 mg/m2 | Freq: Once | INTRAVENOUS | Status: AC
  Administered 2013-07-24: 220 mg via INTRAVENOUS
  Filled 2013-07-24: qty 11

## 2013-07-24 MED ORDER — SODIUM CHLORIDE 0.9 % IV SOLN
Freq: Once | INTRAVENOUS | Status: AC
  Administered 2013-07-24: 12:00:00 via INTRAVENOUS

## 2013-07-24 MED ORDER — PALONOSETRON HCL INJECTION 0.25 MG/5ML
INTRAVENOUS | Status: AC
  Administered 2013-07-24: 0.25 mg
  Filled 2013-07-24: qty 5

## 2013-07-24 MED ORDER — ATROPINE SULFATE 1 MG/ML IJ SOLN
INTRAMUSCULAR | Status: AC
  Filled 2013-07-24: qty 1

## 2013-07-24 MED ORDER — ATROPINE SULFATE 1 MG/ML IJ SOLN
0.5000 mg | Freq: Once | INTRAMUSCULAR | Status: AC | PRN
  Administered 2013-07-24: 0.5 mg via INTRAVENOUS

## 2013-07-24 MED ORDER — SODIUM CHLORIDE 0.9 % IV SOLN
10.0000 mg/kg | Freq: Once | INTRAVENOUS | Status: AC
  Administered 2013-07-24: 650 mg via INTRAVENOUS
  Filled 2013-07-24: qty 26

## 2013-07-24 MED ORDER — PALONOSETRON HCL INJECTION 0.25 MG/5ML: 0.2500 mg | Freq: Once | INTRAVENOUS | Status: DC

## 2013-07-24 NOTE — Progress Notes (Signed)
Hematology and Oncology Follow Up Visit  Mercedes Barnes [id number] 06-04-1961 52 y.o. 07/24/2013 11:46 AM   Principle Diagnosis: Encounter Diagnosis  Name Primary?  . 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 After a brief treatment on a clinical trial at [facility] , due to complications and further progression, she was treated with standard Temodar/radiation and achieved an excellent stabilization. She had her first progression in December 2010. She was put back on treatment with Temodar. She was far enough out from her pulmonary embolism and still on full dose anticoagulation and the [facility] physicians felt it would be safe to put her on Avastin as well. She had a prolonged remission with this regimen and was continued on maintenance Avastin. She had a second progression in October 2013. A regimen of Temodar, Camptosar, and Avastin was recommended and initiated. She has tolerated this combination quite well with minimal myelosuppression or any other toxicities. She has had stable disease on serial two-month scans done at [facility] including most recent study done on 06/20/2013 and compared to a prior study of May 29.. There still residual tumor in the area of the basal ganglia.  She has a chronic left lateral visual field cut. Changes in her vision affect her gait. She has not had any falls since her last visit here. She was able to take a nice trip to the beach with her family and another trip to the mountains.  She denies any severe headache. No new changes in her vision. No seizures. No focal weakness. No slurred speech.   Medications: reviewed  Allergies:  Allergies  Allergen Reactions  . Co-Trimoxazole Injection [Sulfamethoxazole-Trimethoprim] Hives   . Pentamidine     Hypotension & weakness    Review of Systems: Constitutional:   No constitutional symptoms HEENT no sore throat. Chronic rhinitis. Respiratory: No cough or dyspnea Cardiovascular:  No chest pain or palpitations Gastrointestinal: No abdominal pain or change in bowel habit Genito-Urinary: No urinary tract symptoms Musculoskeletal: No muscle bone or joint pain Neurologic: See above Skin: No rash Remaining ROS negative.     Physical Exam: Blood pressure 134/88, pulse 79, temperature 97 F (36.1 C), temperature source Oral, resp. rate 20, height 5\' 3"  (1.6 m), weight 135 lb (61.236 kg). Wt Readings from Last 3 Encounters:  07/24/13 135 lb (61.236 kg)  06/26/13 134 lb 8 oz (61.009 kg)  05/29/13 137 lb 8 oz (62.37 kg)     General appearance: Frail Caucasian woman. There is now near total alopecia [name]: Pharynx no erythema exudate or ulcer Lymph nodes: No adenopathy  Breasts: Lungs: Clear to auscultation resonant to percussion Heart: Regular rhythm no murmur Abdomen: Soft, nontender, no mass, no organomegaly Extremities: No edema. Left calf 35 cm right 34 no calf tenderness Musculoskeletal: No joint deformities GU: Vascular: No cyanosis Neurologic: Mental status intact, cranial nerves grossly normal except for left lateral visual field cut. Motor strength 5 over 5. Left hand is clumsy rapid alternating movements and finger to finger exam. Reflexes 2+ symmetric Skin: No rash or ecchymosis  Lab Results: Lab Results  Component Value Date   WBC 3.3* 07/24/2013   HGB 11.0* 07/24/2013   HCT 33.5* 07/24/2013   MCV 93.6 07/24/2013   PLT 296 07/24/2013     Chemistry  Component Value Date/Time   NA 141 06/26/2013 0916   NA 134* 02/06/2013 1632   NA 138 03/20/2009 1454   K 4.6 06/26/2013 0916   K 3.9 02/06/2013 1632   K 3.8 03/20/2009 1454   CL 107 05/01/2013 1241   CL 101 02/06/2013 1632   CL 100 03/20/2009 1454   CO2 22 06/26/2013 0916   CO2 24 02/06/2013 1632   CO2 27  03/20/2009 1454   BUN 13.0 06/26/2013 0916   BUN 15 02/06/2013 1632   BUN 18 03/20/2009 1454   CREATININE 0.7 06/26/2013 0916   CREATININE 0.48* 02/06/2013 1632   CREATININE 0.51 11/28/2012 1401   CREATININE 0.6 03/20/2009 1454      Component Value Date/Time   CALCIUM 9.3 06/26/2013 0916   CALCIUM 9.7 02/06/2013 1632   CALCIUM 8.6 03/20/2009 1454   ALKPHOS 72 06/26/2013 0916   ALKPHOS 53 02/06/2013 1632   ALKPHOS 65 03/20/2009 1454   AST 18 06/26/2013 0916   AST 17 02/06/2013 1632   AST 14 03/20/2009 1454   ALT 17 06/26/2013 0916   ALT 15 02/06/2013 1632   ALT 21 03/20/2009 1454   BILITOT <0.20 06/26/2013 0916   BILITOT 0.1* 02/06/2013 1632   BILITOT 0.50 03/20/2009 1454       Radiological Studies: See discussion above  Impression: #1. Cystic glioblastoma multiforme in second remission now out over 4 years from initial diagnosis in April 2010. She has had stable disease on irinotecan, Temodar, and Avastin now for almost one year. She is due to be seen at [facility] at the end of this month. If she is radiographically stable, I would really like to modify her regimen and drop the irinotecan. She will discuss this with her doctors at [facility]  #2. Pulmonary embolus shortly after diagnosis in July 2010. Subsequent left lower extremity DVT.  Initial prolonged, full dose, anticoagulation. Currently not on anticoagulation.   #3. 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    CC:.    Annia Belt, MD 9/8/201411:46 AM

## 2013-07-24 NOTE — Telephone Encounter (Signed)
Per staff message and POF I have scheduled appts.  JMW  

## 2013-07-24 NOTE — Patient Instructions (Signed)
[facility] Discharge Instructions for Patients Receiving Chemotherapy  Today you received the following chemotherapy agents : Camptosar & Avastin  To help prevent nausea and vomiting after your treatment, we encourage you to take your nausea medications as needed   If you develop nausea and vomiting that is not controlled by your nausea medication, call the clinic.   BELOW ARE SYMPTOMS THAT SHOULD BE REPORTED IMMEDIATELY:  *FEVER GREATER THAN 100.5 F  *CHILLS WITH OR WITHOUT FEVER  NAUSEA AND VOMITING THAT IS NOT CONTROLLED WITH YOUR NAUSEA MEDICATION  *UNUSUAL SHORTNESS OF BREATH  *UNUSUAL BRUISING OR BLEEDING  TENDERNESS IN MOUTH AND THROAT WITH OR WITHOUT PRESENCE OF ULCERS  *URINARY PROBLEMS  *BOWEL PROBLEMS  UNUSUAL RASH Items with * indicate a potential emergency and should be followed up as soon as possible.  Feel free to call the clinic you have any questions or concerns. The clinic phone number is [phone].

## 2013-08-04 ENCOUNTER — Other Ambulatory Visit: Payer: Self-pay | Admitting: *Deleted

## 2013-08-04 ENCOUNTER — Telehealth: Payer: Self-pay | Admitting: Oncology

## 2013-08-04 NOTE — Telephone Encounter (Signed)
Talked to pt and gave her appt for lab before chemo on 09/22

## 2013-08-07 ENCOUNTER — Ambulatory Visit (HOSPITAL_BASED_OUTPATIENT_CLINIC_OR_DEPARTMENT_OTHER): Payer: Medicare Other

## 2013-08-07 ENCOUNTER — Other Ambulatory Visit (HOSPITAL_BASED_OUTPATIENT_CLINIC_OR_DEPARTMENT_OTHER): Payer: Medicare Other | Admitting: Lab

## 2013-08-07 VITALS — BP 134/84 | Temp 97.1°F | Resp 18

## 2013-08-07 DIAGNOSIS — Z5111 Encounter for antineoplastic chemotherapy: Secondary | ICD-10-CM

## 2013-08-07 DIAGNOSIS — C713 Malignant neoplasm of parietal lobe: Secondary | ICD-10-CM

## 2013-08-07 DIAGNOSIS — C712 Malignant neoplasm of temporal lobe: Secondary | ICD-10-CM

## 2013-08-07 DIAGNOSIS — C719 Malignant neoplasm of brain, unspecified: Secondary | ICD-10-CM

## 2013-08-07 DIAGNOSIS — C711 Malignant neoplasm of frontal lobe: Secondary | ICD-10-CM

## 2013-08-07 DIAGNOSIS — Z5112 Encounter for antineoplastic immunotherapy: Secondary | ICD-10-CM

## 2013-08-07 LAB — UA PROTEIN, DIPSTICK - CHCC: Protein, ur: 100 mg/dL

## 2013-08-07 LAB — COMPREHENSIVE METABOLIC PANEL (CC13)
BUN: 14.5 mg/dL (ref 7.0–26.0)
CO2: 24 mEq/L (ref 22–29)
Calcium: 9.3 mg/dL (ref 8.4–10.4)
Chloride: 110 mEq/L — ABNORMAL HIGH (ref 98–109)
Creatinine: 0.7 mg/dL (ref 0.6–1.1)
Total Bilirubin: 0.32 mg/dL (ref 0.20–1.20)

## 2013-08-07 LAB — CBC WITH DIFFERENTIAL/PLATELET
Eosinophils Absolute: 0.1 10*3/uL (ref 0.0–0.5)
HCT: 32.5 % — ABNORMAL LOW (ref 34.8–46.6)
LYMPH%: 26.7 % (ref 14.0–49.7)
MCHC: 32.6 g/dL (ref 31.5–36.0)
MCV: 93.9 fL (ref 79.5–101.0)
MONO#: 0.6 10*3/uL (ref 0.1–0.9)
MONO%: 16.8 % — ABNORMAL HIGH (ref 0.0–14.0)
NEUT#: 2 10*3/uL (ref 1.5–6.5)
NEUT%: 53.7 % (ref 38.4–76.8)
Platelets: 231 10*3/uL (ref 145–400)
WBC: 3.6 10*3/uL — ABNORMAL LOW (ref 3.9–10.3)

## 2013-08-07 MED ORDER — SODIUM CHLORIDE 0.9 % IV SOLN
Freq: Once | INTRAVENOUS | Status: AC
  Administered 2013-08-07: 11:00:00 via INTRAVENOUS

## 2013-08-07 MED ORDER — IRINOTECAN HCL CHEMO INJECTION 100 MG/5ML
130.0000 mg/m2 | Freq: Once | INTRAVENOUS | Status: AC
  Administered 2013-08-07: 220 mg via INTRAVENOUS
  Filled 2013-08-07: qty 11

## 2013-08-07 MED ORDER — PALONOSETRON HCL INJECTION 0.25 MG/5ML
INTRAVENOUS | Status: AC
  Filled 2013-08-07: qty 5

## 2013-08-07 MED ORDER — ATROPINE SULFATE 1 MG/ML IJ SOLN
0.5000 mg | Freq: Once | INTRAMUSCULAR | Status: AC | PRN
  Administered 2013-08-07: 0.5 mg via INTRAVENOUS

## 2013-08-07 MED ORDER — PALONOSETRON HCL INJECTION 0.25 MG/5ML
0.2500 mg | Freq: Once | INTRAVENOUS | Status: AC
  Administered 2013-08-07: 0.25 mg via INTRAVENOUS

## 2013-08-07 MED ORDER — ATROPINE SULFATE 1 MG/ML IJ SOLN
INTRAMUSCULAR | Status: AC
  Filled 2013-08-07: qty 1

## 2013-08-07 MED ORDER — SODIUM CHLORIDE 0.9 % IV SOLN
10.0000 mg/kg | Freq: Once | INTRAVENOUS | Status: AC
  Administered 2013-08-07: 650 mg via INTRAVENOUS
  Filled 2013-08-07: qty 26

## 2013-08-07 NOTE — Patient Instructions (Addendum)
[facility] Discharge Instructions for Patients Receiving Chemotherapy  Today you received the following chemotherapy agents Avastin and Camptosar.  To help prevent nausea and vomiting after your treatment, we encourage you to take your nausea medication as prescribed.    If you develop nausea and vomiting that is not controlled by your nausea medication, call the clinic.   BELOW ARE SYMPTOMS THAT SHOULD BE REPORTED IMMEDIATELY:  *FEVER GREATER THAN 100.5 F  *CHILLS WITH OR WITHOUT FEVER  NAUSEA AND VOMITING THAT IS NOT CONTROLLED WITH YOUR NAUSEA MEDICATION  *UNUSUAL SHORTNESS OF BREATH  *UNUSUAL BRUISING OR BLEEDING  TENDERNESS IN MOUTH AND THROAT WITH OR WITHOUT PRESENCE OF ULCERS  *URINARY PROBLEMS  *BOWEL PROBLEMS  UNUSUAL RASH Items with * indicate a potential emergency and should be followed up as soon as possible.  Feel free to call the clinic you have any questions or concerns. The clinic phone number is [phone].

## 2013-08-18 ENCOUNTER — Encounter: Payer: Self-pay | Admitting: *Deleted

## 2013-08-18 NOTE — Progress Notes (Signed)
RECEIVED A FAX FROM [employer] CONCERNING A CONFIRMATION OF PRESCRIPTION SHIPMENT FOR TEMOZOLOMIDE ON 08/17/13.

## 2013-08-21 ENCOUNTER — Other Ambulatory Visit (HOSPITAL_BASED_OUTPATIENT_CLINIC_OR_DEPARTMENT_OTHER): Payer: Medicare Other | Admitting: Lab

## 2013-08-21 ENCOUNTER — Telehealth: Payer: Self-pay | Admitting: Oncology

## 2013-08-21 ENCOUNTER — Ambulatory Visit (HOSPITAL_BASED_OUTPATIENT_CLINIC_OR_DEPARTMENT_OTHER): Payer: Medicare Other | Admitting: Nurse Practitioner

## 2013-08-21 ENCOUNTER — Ambulatory Visit (HOSPITAL_BASED_OUTPATIENT_CLINIC_OR_DEPARTMENT_OTHER): Payer: Medicare Other

## 2013-08-21 VITALS — BP 137/91 | HR 82 | Temp 96.7°F | Resp 18 | Ht 63.0 in | Wt 135.4 lb

## 2013-08-21 DIAGNOSIS — C713 Malignant neoplasm of parietal lobe: Secondary | ICD-10-CM

## 2013-08-21 DIAGNOSIS — Z5112 Encounter for antineoplastic immunotherapy: Secondary | ICD-10-CM

## 2013-08-21 DIAGNOSIS — Z86711 Personal history of pulmonary embolism: Secondary | ICD-10-CM

## 2013-08-21 DIAGNOSIS — C711 Malignant neoplasm of frontal lobe: Secondary | ICD-10-CM

## 2013-08-21 DIAGNOSIS — C712 Malignant neoplasm of temporal lobe: Secondary | ICD-10-CM

## 2013-08-21 DIAGNOSIS — C719 Malignant neoplasm of brain, unspecified: Secondary | ICD-10-CM

## 2013-08-21 DIAGNOSIS — Z5111 Encounter for antineoplastic chemotherapy: Secondary | ICD-10-CM

## 2013-08-21 LAB — COMPREHENSIVE METABOLIC PANEL (CC13)
ALT: 16 U/L (ref 0–55)
Albumin: 3.3 g/dL — ABNORMAL LOW (ref 3.5–5.0)
BUN: 14.5 mg/dL (ref 7.0–26.0)
CO2: 24 mEq/L (ref 22–29)
Chloride: 108 mEq/L (ref 98–109)
Creatinine: 0.6 mg/dL (ref 0.6–1.1)
Total Bilirubin: 0.23 mg/dL (ref 0.20–1.20)

## 2013-08-21 LAB — CBC WITH DIFFERENTIAL/PLATELET
Eosinophils Absolute: 0.1 10*3/uL (ref 0.0–0.5)
LYMPH%: 29.3 % (ref 14.0–49.7)
MCH: 30.6 pg (ref 25.1–34.0)
MCHC: 32.5 g/dL (ref 31.5–36.0)
MCV: 94.2 fL (ref 79.5–101.0)
MONO%: 17.1 % — ABNORMAL HIGH (ref 0.0–14.0)
NEUT#: 1.9 10*3/uL (ref 1.5–6.5)
NEUT%: 50 % (ref 38.4–76.8)
Platelets: 303 10*3/uL (ref 145–400)
RBC: 3.59 10*6/uL — ABNORMAL LOW (ref 3.70–5.45)
RDW: 16 % — ABNORMAL HIGH (ref 11.2–14.5)
WBC: 3.9 10*3/uL (ref 3.9–10.3)
nRBC: 0 % (ref 0–0)

## 2013-08-21 LAB — UA PROTEIN, DIPSTICK - CHCC: Protein, ur: 100 mg/dL

## 2013-08-21 MED ORDER — PALONOSETRON HCL INJECTION 0.25 MG/5ML
0.2500 mg | Freq: Once | INTRAVENOUS | Status: AC
  Administered 2013-08-21: 0.25 mg via INTRAVENOUS

## 2013-08-21 MED ORDER — IRINOTECAN HCL CHEMO INJECTION 100 MG/5ML
130.0000 mg/m2 | Freq: Once | INTRAVENOUS | Status: AC
  Administered 2013-08-21: 220 mg via INTRAVENOUS
  Filled 2013-08-21: qty 11

## 2013-08-21 MED ORDER — SODIUM CHLORIDE 0.9 % IV SOLN
Freq: Once | INTRAVENOUS | Status: AC
  Administered 2013-08-21: 12:00:00 via INTRAVENOUS

## 2013-08-21 MED ORDER — SODIUM CHLORIDE 0.9 % IV SOLN
10.0000 mg/kg | Freq: Once | INTRAVENOUS | Status: AC
  Administered 2013-08-21: 650 mg via INTRAVENOUS
  Filled 2013-08-21: qty 26

## 2013-08-21 MED ORDER — PALONOSETRON HCL INJECTION 0.25 MG/5ML
INTRAVENOUS | Status: AC
  Filled 2013-08-21: qty 5

## 2013-08-21 NOTE — Telephone Encounter (Signed)
gv and printed appt sch3ed and avs for pt for OCT and NOV....MW added tx.

## 2013-08-21 NOTE — Progress Notes (Signed)
OK to treat despite urine protein 100 per Lonna Cobb NP

## 2013-08-21 NOTE — Progress Notes (Signed)
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 She underwent debulking surgery. She was initially treated on a clinical trial of Temodar, irinotecan and Avastin. Course was complicated by bilateral pulmonary emboli and diffuse bilateral interstitial pulmonary infiltrates, suspected pneumocystis pneumonia in April 2010. She was subsequently treated off protocol with cranial radiation and concomitant low-dose oral Temodar 06/05/2009 through 07/16/2009. She was then treated with maintenance Temodar 200 mg per meter square for 5 days each month. There was evidence of early progression while on Temodar in December 2010. Avastin was resumed at that time with disease stabilization. She completed 12 cycles of adjuvant Temodar plus Avastin 08/11/2010. She then began maintenance Avastin 15 mg per kilogram every 4 weeks.   Followup MRI of the brain done at [facility] on 08/29/2012 showed signs of progression. It was recommended to start her back on Temodar 80 mg daily and to increase Avastin back to 10 mg per kilogram IV every 2 weeks. She was treated with Avastin on 09/05/2012 and 09/19/2012.   We received a call from [facility] on 09/22/2012 that the tumor was growing and they advised adding irinotecan 125 mg per meter squared with the next Avastin. Irinotecan was added on a 2 week schedule beginning 10/03/2012.   She is followed closely at [facility] with routine scans.  She presents today for scheduled followup. She reports the MRI scan done at [facility] on 08/10/2013 looked "good". Her husband reports the plan is to continue the current treatment through 09/18/2013 and then drop irinotecan out of the regimen.  Mr. and Mrs. Mccarney both feel she is doing well. She has a stable left lateral visual field deficit. She has mild intermittent headaches that are relieved  with Tylenol. No nausea or vomiting. No mouth sores. She denies diarrhea. No skin rash. She denies shortness of breath and chest pain. She has stable leg edema. She denies any bleeding. No falls. She notes occasional tingling in the right hand. She reports a good appetite.   Objective: Blood pressure 137/91, pulse 82, temperature 96.7 F (35.9 C), temperature source Oral, resp. rate 18, height 5\' 3"  (1.6 m), weight 135 lb 6.4 oz (61.417 kg), SpO2 100.00%.  Pupils equal round and reactive to light. Extraocular movements intact. Sclera anicteric. Oropharynx is without thrush or ulceration. No palpable cervical,supraclavicular or axillary lymph nodes. Lungs are clear. No wheezes or rales. Regular cardiac rhythm. No murmur. Abdomen is soft and nontender. No organomegaly. Chronic bilateral lower leg edema left greater than right. Calves are nontender. Motor strength 5 over 5. Knee DTRs 2+, symmetric. Finger to nose intact. Clumsiness with rapid alternating movement of the left hand. Alert and oriented. Follows commands. Persistent left lateral visual field cut.  Lab Results: Lab Results  Component Value Date   WBC 3.9 08/21/2013   HGB 11.0* 08/21/2013   HCT 33.8* 08/21/2013   MCV 94.2 08/21/2013   PLT 303 08/21/2013    Chemistry:    Chemistry      Component Value Date/Time   NA 142 08/07/2013 1018   NA 134* 02/06/2013 1632   NA 138 03/20/2009 1454   K 4.2 08/07/2013 1018   K 3.9 02/06/2013 1632   K 3.8 03/20/2009 1454   CL 107 05/01/2013 1241   CL 101 02/06/2013 1632   CL 100 03/20/2009 1454   CO2 24 08/07/2013 1018  CO2 24 02/06/2013 1632   CO2 27 03/20/2009 1454   BUN 14.5 08/07/2013 1018   BUN 15 02/06/2013 1632   BUN 18 03/20/2009 1454   CREATININE 0.7 08/07/2013 1018   CREATININE 0.48* 02/06/2013 1632   CREATININE 0.51 11/28/2012 1401   CREATININE 0.6 03/20/2009 1454      Component Value Date/Time   CALCIUM 9.3 08/07/2013 1018   CALCIUM 9.7 02/06/2013 1632   CALCIUM 8.6 03/20/2009 1454   ALKPHOS 75  08/07/2013 1018   ALKPHOS 53 02/06/2013 1632   ALKPHOS 65 03/20/2009 1454   AST 24 08/07/2013 1018   AST 17 02/06/2013 1632   AST 14 03/20/2009 1454   ALT 20 08/07/2013 1018   ALT 15 02/06/2013 1632   ALT 21 03/20/2009 1454   BILITOT 0.32 08/07/2013 1018   BILITOT 0.1* 02/06/2013 1632   BILITOT 0.50 03/20/2009 1454       Studies/Results: No results found.  Medications: I have reviewed the patient's current medications.  Assessment/Plan:  1. 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 She underwent debulking surgery. She was initially treated on a clinical trial with Temodar, irinotecan and Avastin. Course was complicated by development of bilateral pulmonary emboli and diffuse bilateral interstitial pulmonary infiltrates and suspected pneumocystis pneumonia April 2010. She was subsequently treated off protocol with cranial radiation with concomitant low-dose oral Temodar 07/21 through 07/16/2009. Subsequent maintenance Temodar 200 mg per meter squared 5 days each month. There was early progression while on Temodar December 2010. Avastin was added back at that time with disease stabilization. She completed 12 cycles of adjuvant Temodar plus Avastin 08/11/2010. Maintenance Avastin continued every 4 weeks. MRI [facility] the brain at [facility] on 05/26/2012 with no evidence of disease progression. MRI of the brain at [facility] on 08/29/2012 with evidence of disease progression. Temodar resumed at a daily dose and Avastin change to 10 mg per kilogram IV every 2 weeks. Irinotecan added on a 2 week schedule beginning 10/03/2012. Restaging brain MRI 02/09/2013 with stable disease. Most recent study 08/10/2013 reported to patient as "good". 2. Bilateral pulmonary emboli occurring in July 2010. Coumadin was discontinued following an office visit 10/17/2012. 3. History of bilateral interstitial  pneumonia occurring around the time of initial diagnosis of GBM. She was treated empirically for Pneumocystis. 4. Asymmetric leg edema 11/04/2012. Bilateral venous Doppler negative for DVT. 5. Intermittent proteinuria related to Avastin. Urine protein measured at 100 mg/dL today. She had a 24-hour urine in December 2013 after the urine protein returned at 300 mg/dL. The 24-hour urine showed 572 mg of protein excretion. 6. Left lower extremity pain and swelling 02/06/2013. Negative venous duplex Doppler.  Disposition-Ms. Despain appears stable. Plan to continue the current regimen of Temodar 80 mg daily, Avastin 10 mg per kilogram IV every 2 weeks and Camptosar 130 mg per meter squared IV every 2 weeks with possible discontinuation of the Camptosar from the regimen after treatment 09/18/2013. We will await formal notification of the plan from [place]. She will return for a followup visit with Dr. Beryle Beams on 10/02/2013. She will contact the office in the interim with any problems.  Ned Card ANP/GNP-BC

## 2013-08-28 ENCOUNTER — Ambulatory Visit: Payer: Medicare Other | Admitting: Nurse Practitioner

## 2013-09-04 ENCOUNTER — Other Ambulatory Visit: Payer: Medicare Other | Admitting: Lab

## 2013-09-04 ENCOUNTER — Other Ambulatory Visit: Payer: Self-pay | Admitting: Oncology

## 2013-09-04 ENCOUNTER — Ambulatory Visit (HOSPITAL_BASED_OUTPATIENT_CLINIC_OR_DEPARTMENT_OTHER): Payer: Medicare Other

## 2013-09-04 ENCOUNTER — Other Ambulatory Visit (HOSPITAL_BASED_OUTPATIENT_CLINIC_OR_DEPARTMENT_OTHER): Payer: Medicare Other | Admitting: Lab

## 2013-09-04 VITALS — BP 141/86 | HR 68 | Temp 97.1°F | Resp 18

## 2013-09-04 DIAGNOSIS — C712 Malignant neoplasm of temporal lobe: Secondary | ICD-10-CM

## 2013-09-04 DIAGNOSIS — Z5111 Encounter for antineoplastic chemotherapy: Secondary | ICD-10-CM

## 2013-09-04 DIAGNOSIS — C711 Malignant neoplasm of frontal lobe: Secondary | ICD-10-CM

## 2013-09-04 DIAGNOSIS — C719 Malignant neoplasm of brain, unspecified: Secondary | ICD-10-CM

## 2013-09-04 DIAGNOSIS — C713 Malignant neoplasm of parietal lobe: Secondary | ICD-10-CM

## 2013-09-04 DIAGNOSIS — Z5112 Encounter for antineoplastic immunotherapy: Secondary | ICD-10-CM

## 2013-09-04 LAB — CBC WITH DIFFERENTIAL/PLATELET
Basophils Absolute: 0 10*3/uL (ref 0.0–0.1)
Eosinophils Absolute: 0.1 10*3/uL (ref 0.0–0.5)
HCT: 32.4 % — ABNORMAL LOW (ref 34.8–46.6)
LYMPH%: 27.1 % (ref 14.0–49.7)
MCV: 93.1 fL (ref 79.5–101.0)
MONO#: 0.6 10*3/uL (ref 0.1–0.9)
MONO%: 15 % — ABNORMAL HIGH (ref 0.0–14.0)
NEUT#: 2.2 10*3/uL (ref 1.5–6.5)
NEUT%: 55.5 % (ref 38.4–76.8)
Platelets: 302 10*3/uL (ref 145–400)
RBC: 3.48 10*6/uL — ABNORMAL LOW (ref 3.70–5.45)
WBC: 3.9 10*3/uL (ref 3.9–10.3)

## 2013-09-04 MED ORDER — SODIUM CHLORIDE 0.9 % IV SOLN
Freq: Once | INTRAVENOUS | Status: AC
  Administered 2013-09-04: 13:00:00 via INTRAVENOUS

## 2013-09-04 MED ORDER — ATROPINE SULFATE 1 MG/ML IJ SOLN
0.5000 mg | Freq: Once | INTRAMUSCULAR | Status: AC | PRN
  Administered 2013-09-04: 0.5 mg via INTRAVENOUS

## 2013-09-04 MED ORDER — ATROPINE SULFATE 1 MG/ML IJ SOLN
INTRAMUSCULAR | Status: AC
  Filled 2013-09-04: qty 1

## 2013-09-04 MED ORDER — SODIUM CHLORIDE 0.9 % IV SOLN
10.0000 mg/kg | Freq: Once | INTRAVENOUS | Status: AC
  Administered 2013-09-04: 650 mg via INTRAVENOUS
  Filled 2013-09-04: qty 26

## 2013-09-04 MED ORDER — PALONOSETRON HCL INJECTION 0.25 MG/5ML
INTRAVENOUS | Status: AC
  Filled 2013-09-04: qty 5

## 2013-09-04 MED ORDER — IRINOTECAN HCL CHEMO INJECTION 100 MG/5ML
130.0000 mg/m2 | Freq: Once | INTRAVENOUS | Status: AC
  Administered 2013-09-04: 220 mg via INTRAVENOUS
  Filled 2013-09-04: qty 11

## 2013-09-04 MED ORDER — PALONOSETRON HCL INJECTION 0.25 MG/5ML
0.2500 mg | Freq: Once | INTRAVENOUS | Status: AC
  Administered 2013-09-04: 0.25 mg via INTRAVENOUS

## 2013-09-04 NOTE — Patient Instructions (Signed)
[facility] Discharge Instructions for Patients Receiving Chemotherapy  Today you received the following chemotherapy agents:  Avastin, irinotecan   To help prevent nausea and vomiting after your treatment, we encourage you to take your nausea medication.  Take it as often as prescribed.     If you develop nausea and vomiting that is not controlled by your nausea medication, call the clinic. If it is after clinic hours your family physician or the after hours number for the clinic or go to the Emergency Department.   BELOW ARE SYMPTOMS THAT SHOULD BE REPORTED IMMEDIATELY:  *FEVER GREATER THAN 100.5 F  *CHILLS WITH OR WITHOUT FEVER  NAUSEA AND VOMITING THAT IS NOT CONTROLLED WITH YOUR NAUSEA MEDICATION  *UNUSUAL SHORTNESS OF BREATH  *UNUSUAL BRUISING OR BLEEDING  TENDERNESS IN MOUTH AND THROAT WITH OR WITHOUT PRESENCE OF ULCERS  *URINARY PROBLEMS  *BOWEL PROBLEMS  UNUSUAL RASH Items with * indicate a potential emergency and should be followed up as soon as possible.  Feel free to call the clinic you have any questions or concerns. The clinic phone number is [phone].   I have been informed and understand all the instructions given to me. I know to contact the clinic, my physician, or go to the Emergency Department if any problems should occur. I do not have any questions at this time, but understand that I may call the clinic during office hours   should I have any questions or need assistance in obtaining follow up care.    __________________________________________  _____________  __________ Signature of Patient or Authorized Representative            Date                   Time    __________________________________________ Nurse's Signature

## 2013-09-18 ENCOUNTER — Ambulatory Visit (HOSPITAL_BASED_OUTPATIENT_CLINIC_OR_DEPARTMENT_OTHER): Payer: Medicare Other

## 2013-09-18 ENCOUNTER — Other Ambulatory Visit (HOSPITAL_BASED_OUTPATIENT_CLINIC_OR_DEPARTMENT_OTHER): Payer: Medicare Other | Admitting: Lab

## 2013-09-18 ENCOUNTER — Other Ambulatory Visit: Payer: Medicare Other | Admitting: Lab

## 2013-09-18 VITALS — BP 144/89 | HR 76 | Temp 97.5°F | Resp 18

## 2013-09-18 DIAGNOSIS — C711 Malignant neoplasm of frontal lobe: Secondary | ICD-10-CM

## 2013-09-18 DIAGNOSIS — C719 Malignant neoplasm of brain, unspecified: Secondary | ICD-10-CM

## 2013-09-18 DIAGNOSIS — C712 Malignant neoplasm of temporal lobe: Secondary | ICD-10-CM

## 2013-09-18 DIAGNOSIS — Z5112 Encounter for antineoplastic immunotherapy: Secondary | ICD-10-CM

## 2013-09-18 DIAGNOSIS — C713 Malignant neoplasm of parietal lobe: Secondary | ICD-10-CM

## 2013-09-18 DIAGNOSIS — Z5111 Encounter for antineoplastic chemotherapy: Secondary | ICD-10-CM

## 2013-09-18 LAB — CBC WITH DIFFERENTIAL/PLATELET
BASO%: 0.4 % (ref 0.0–2.0)
EOS%: 2.4 % (ref 0.0–7.0)
Eosinophils Absolute: 0.1 10*3/uL (ref 0.0–0.5)
HCT: 32 % — ABNORMAL LOW (ref 34.8–46.6)
LYMPH%: 24.4 % (ref 14.0–49.7)
MCH: 31.4 pg (ref 25.1–34.0)
MCHC: 33.4 g/dL (ref 31.5–36.0)
MCV: 93.8 fL (ref 79.5–101.0)
MONO#: 0.5 10*3/uL (ref 0.1–0.9)
NEUT#: 2.9 10*3/uL (ref 1.5–6.5)
Platelets: 307 10*3/uL (ref 145–400)
RBC: 3.41 10*6/uL — ABNORMAL LOW (ref 3.70–5.45)
WBC: 4.7 10*3/uL (ref 3.9–10.3)
lymph#: 1.1 10*3/uL (ref 0.9–3.3)
nRBC: 0 % (ref 0–0)

## 2013-09-18 LAB — COMPREHENSIVE METABOLIC PANEL (CC13)
ALT: 15 U/L (ref 0–55)
AST: 19 U/L (ref 5–34)
Albumin: 3.1 g/dL — ABNORMAL LOW (ref 3.5–5.0)
Anion Gap: 11 mEq/L (ref 3–11)
CO2: 21 mEq/L — ABNORMAL LOW (ref 22–29)
Calcium: 9.3 mg/dL (ref 8.4–10.4)
Chloride: 109 mEq/L (ref 98–109)
Creatinine: 0.6 mg/dL (ref 0.6–1.1)
Glucose: 87 mg/dl (ref 70–140)
Potassium: 4 mEq/L (ref 3.5–5.1)
Total Protein: 6.4 g/dL (ref 6.4–8.3)

## 2013-09-18 LAB — UA PROTEIN, DIPSTICK - CHCC: Protein, ur: 100 mg/dL

## 2013-09-18 MED ORDER — SODIUM CHLORIDE 0.9 % IV SOLN
10.0000 mg/kg | Freq: Once | INTRAVENOUS | Status: AC
  Administered 2013-09-18: 650 mg via INTRAVENOUS
  Filled 2013-09-18: qty 26

## 2013-09-18 MED ORDER — SODIUM CHLORIDE 0.9 % IV SOLN
Freq: Once | INTRAVENOUS | Status: AC
  Administered 2013-09-18: 12:00:00 via INTRAVENOUS

## 2013-09-18 MED ORDER — IRINOTECAN HCL CHEMO INJECTION 100 MG/5ML
130.0000 mg/m2 | Freq: Once | INTRAVENOUS | Status: AC
  Administered 2013-09-18: 220 mg via INTRAVENOUS
  Filled 2013-09-18: qty 11

## 2013-09-18 MED ORDER — PALONOSETRON HCL INJECTION 0.25 MG/5ML
INTRAVENOUS | Status: AC
  Filled 2013-09-18: qty 5

## 2013-09-18 MED ORDER — ATROPINE SULFATE 1 MG/ML IJ SOLN
0.5000 mg | Freq: Once | INTRAMUSCULAR | Status: AC | PRN
  Administered 2013-09-18: 0.5 mg via INTRAVENOUS

## 2013-09-18 MED ORDER — PALONOSETRON HCL INJECTION 0.25 MG/5ML
0.2500 mg | Freq: Once | INTRAVENOUS | Status: AC
  Administered 2013-09-18: 0.25 mg via INTRAVENOUS

## 2013-09-18 MED ORDER — ATROPINE SULFATE 1 MG/ML IJ SOLN
INTRAMUSCULAR | Status: AC
  Filled 2013-09-18: qty 1

## 2013-09-18 NOTE — Patient Instructions (Signed)
[facility] Discharge Instructions for Patients Receiving Chemotherapy  Today you received the following chemotherapy agents Avastin and Irinotecan.  To help prevent nausea and vomiting after your treatment, we encourage you to take your nausea medication.   If you develop nausea and vomiting that is not controlled by your nausea medication, call the clinic.   BELOW ARE SYMPTOMS THAT SHOULD BE REPORTED IMMEDIATELY:  *FEVER GREATER THAN 100.5 F  *CHILLS WITH OR WITHOUT FEVER  NAUSEA AND VOMITING THAT IS NOT CONTROLLED WITH YOUR NAUSEA MEDICATION  *UNUSUAL SHORTNESS OF BREATH  *UNUSUAL BRUISING OR BLEEDING  TENDERNESS IN MOUTH AND THROAT WITH OR WITHOUT PRESENCE OF ULCERS  *URINARY PROBLEMS  *BOWEL PROBLEMS  UNUSUAL RASH Items with * indicate a potential emergency and should be followed up as soon as possible.  Feel free to call the clinic you have any questions or concerns. The clinic phone number is [phone].

## 2013-09-20 ENCOUNTER — Telehealth: Payer: Self-pay | Admitting: *Deleted

## 2013-09-20 NOTE — Telephone Encounter (Signed)
Temozolomide shipped 09/19/13 for next day delivery.

## 2013-10-02 ENCOUNTER — Telehealth: Payer: Self-pay | Admitting: Oncology

## 2013-10-02 ENCOUNTER — Ambulatory Visit (HOSPITAL_BASED_OUTPATIENT_CLINIC_OR_DEPARTMENT_OTHER): Payer: Medicare Other

## 2013-10-02 ENCOUNTER — Telehealth: Payer: Self-pay | Admitting: *Deleted

## 2013-10-02 ENCOUNTER — Other Ambulatory Visit (HOSPITAL_BASED_OUTPATIENT_CLINIC_OR_DEPARTMENT_OTHER): Payer: Medicare Other | Admitting: Lab

## 2013-10-02 ENCOUNTER — Ambulatory Visit (HOSPITAL_BASED_OUTPATIENT_CLINIC_OR_DEPARTMENT_OTHER): Payer: Medicare Other | Admitting: Oncology

## 2013-10-02 VITALS — BP 121/76 | HR 71 | Temp 97.9°F | Resp 18 | Ht 63.0 in | Wt 130.3 lb

## 2013-10-02 DIAGNOSIS — C713 Malignant neoplasm of parietal lobe: Secondary | ICD-10-CM

## 2013-10-02 DIAGNOSIS — Z5112 Encounter for antineoplastic immunotherapy: Secondary | ICD-10-CM

## 2013-10-02 DIAGNOSIS — C711 Malignant neoplasm of frontal lobe: Secondary | ICD-10-CM

## 2013-10-02 DIAGNOSIS — C712 Malignant neoplasm of temporal lobe: Secondary | ICD-10-CM

## 2013-10-02 DIAGNOSIS — Z7901 Long term (current) use of anticoagulants: Secondary | ICD-10-CM

## 2013-10-02 DIAGNOSIS — Z86711 Personal history of pulmonary embolism: Secondary | ICD-10-CM

## 2013-10-02 DIAGNOSIS — C719 Malignant neoplasm of brain, unspecified: Secondary | ICD-10-CM

## 2013-10-02 DIAGNOSIS — E039 Hypothyroidism, unspecified: Secondary | ICD-10-CM

## 2013-10-02 DIAGNOSIS — I2699 Other pulmonary embolism without acute cor pulmonale: Secondary | ICD-10-CM

## 2013-10-02 DIAGNOSIS — Z86718 Personal history of other venous thrombosis and embolism: Secondary | ICD-10-CM

## 2013-10-02 LAB — CBC WITH DIFFERENTIAL/PLATELET
Basophils Absolute: 0 10*3/uL (ref 0.0–0.1)
HCT: 32.1 % — ABNORMAL LOW (ref 34.8–46.6)
HGB: 10.4 g/dL — ABNORMAL LOW (ref 11.6–15.9)
MCH: 30.6 pg (ref 25.1–34.0)
MONO#: 0.8 10*3/uL (ref 0.1–0.9)
NEUT#: 3.2 10*3/uL (ref 1.5–6.5)
NEUT%: 62.6 % (ref 38.4–76.8)
RDW: 16.2 % — ABNORMAL HIGH (ref 11.2–14.5)
WBC: 5.1 10*3/uL (ref 3.9–10.3)
lymph#: 1 10*3/uL (ref 0.9–3.3)
nRBC: 0 % (ref 0–0)

## 2013-10-02 LAB — UA PROTEIN, DIPSTICK - CHCC: Protein, ur: 100 mg/dL

## 2013-10-02 MED ORDER — AMLODIPINE BESYLATE 10 MG PO TABS: 10.0000 mg | ORAL_TABLET | Freq: Every day | ORAL | Status: DC

## 2013-10-02 MED ORDER — SODIUM CHLORIDE 0.9 % IV SOLN
Freq: Once | INTRAVENOUS | Status: AC
  Administered 2013-10-02: 14:00:00 via INTRAVENOUS

## 2013-10-02 MED ORDER — LEVETIRACETAM 500 MG PO TABS: 500.0000 mg | ORAL_TABLET | Freq: Every day | ORAL | Status: DC

## 2013-10-02 MED ORDER — SODIUM CHLORIDE 0.9 % IV SOLN
10.0000 mg/kg | Freq: Once | INTRAVENOUS | Status: AC
  Administered 2013-10-02: 650 mg via INTRAVENOUS
  Filled 2013-10-02: qty 26

## 2013-10-02 MED ORDER — VALACYCLOVIR HCL 500 MG PO TABS: 500.0000 mg | ORAL_TABLET | Freq: Every day | ORAL | Status: DC

## 2013-10-02 NOTE — Telephone Encounter (Signed)
Pt will get appt calendar for chemo,md,labs  @ chemo room with Marcelino Duster

## 2013-10-02 NOTE — Progress Notes (Signed)
OK to treat with urine protein 100 per Dr Cyndie Chime

## 2013-10-02 NOTE — Patient Instructions (Signed)
[facility] Discharge Instructions for Patients Receiving Chemotherapy  Today you received the following chemotherapy agents avastin  To help prevent nausea and vomiting after your treatment, we encourage you to take your nausea medication if needed.   If you develop nausea and vomiting that is not controlled by your nausea medication, call the clinic.   BELOW ARE SYMPTOMS THAT SHOULD BE REPORTED IMMEDIATELY:  *FEVER GREATER THAN 100.5 F  *CHILLS WITH OR WITHOUT FEVER  NAUSEA AND VOMITING THAT IS NOT CONTROLLED WITH YOUR NAUSEA MEDICATION  *UNUSUAL SHORTNESS OF BREATH  *UNUSUAL BRUISING OR BLEEDING  TENDERNESS IN MOUTH AND THROAT WITH OR WITHOUT PRESENCE OF ULCERS  *URINARY PROBLEMS  *BOWEL PROBLEMS  UNUSUAL RASH Items with * indicate a potential emergency and should be followed up as soon as possible.  Feel free to call the clinic you have any questions or concerns. The clinic phone number is [phone].    

## 2013-10-02 NOTE — Telephone Encounter (Signed)
Per staff message and POF I have scheduled appts.  JMW  

## 2013-10-05 NOTE — Progress Notes (Signed)
Hematology and Oncology Follow Up Visit  Mercedes Barnes [id number] Mar 16, 1961 52 y.o. 10/05/2013 4:52 PM   Principle Diagnosis: Encounter Diagnoses  Name Primary?  . Glioblastoma multiforme of temporal lobe Yes  . Pulmonary embolism   . Hypothyroid   . Chronic anticoagulation      Interim History:   Followup visit for this pleasant 52 year old woman with relapsed glioblastoma multiforme. She has been stable now for one year on a regimen of every two-week irinotecan plus Avastin along with daily low-dose oral Temodar. Husband reports that she is actually had a good month. Mentation is good. No progressive visual changes. No seizure activity. No focal weakness or slurred speech. No interim infections.  Medications: reviewed  Allergies:  Allergies  Allergen Reactions  . Co-Trimoxazole Injection [Sulfamethoxazole-Trimethoprim] Hives  . Pentamidine     Hypotension & weakness    Review of Systems:  ENT ROS: No sore throat Breast ROS:  Respiratory ROS: No cough or dyspnea Cardiovascular ROS:   No chest pain or palpitations Gastrointestinal ROS: No abdominal pain or change in bowel habit Genito-Urinary ROS: No urinary tract symptoms. No vaginal discharge Musculoskeletal ROS: No muscle bone or joint pain Neurological ROS: See above Dermatological ROS: No rash or ecchymosis Remaining ROS negative.  Physical Exam: Blood pressure 121/76, pulse 71, temperature 97.9 F (36.6 C), temperature source Oral, resp. rate 18, height 5\' 3"  (1.6 m), weight 130 lb 4.8 oz (59.104 kg), SpO2 100.00%. Wt Readings from Last 3 Encounters:  10/02/13 130 lb 4.8 oz (59.104 kg)  08/21/13 135 lb 6.4 oz (61.417 kg)  07/24/13 135 lb (61.236 kg)     General appearance: Petite Caucasian woman alert and oriented [name]: Pharynx no erythema, exudate, mass, or ulcer. No thyromegaly or thyroid nodules Lymph nodes: No cervical, supraclavicular, or axillary lymphadenopathy Breasts:  Lungs: Clear to  auscultation, resonant to percussion throughout Heart: Regular rhythm, no murmur, no gallop, no rub, no click, no edema Abdomen: Soft, nontender, normal bowel sounds, no mass, no organomegaly Extremities: No edema, chronic brawny edema of her left calf. Repetitive Doppler studies have not shown recurrent DVT. Musculoskeletal: no joint deformities GU:  Vascular: Carotid pulses 2+, no bruits, distal pulses: Dorsalis pedis 1+ symmetric Neurologic: Alert, oriented, PERRLA, optic discs sharp and vessels normal, no hemorrhage or exudate, cranial nerves grossly normal except decreased peripheral vision left eye., motor strength 5 over 5, reflexes 1+ symmetric, upper body coordination normal, except for clumsiness on finger finger movements of her left hand which is chronic. gait normal, Skin: No rash or ecchymosis  Lab Results: CBC W/Diff    Component Value Date/Time   WBC 5.1 10/02/2013 0954   WBC 10.3 06/02/2009 0530   WBC 9.5 03/20/2009 1454   RBC 3.40* 10/02/2013 0954   RBC 3.57* 06/02/2009 0530   HGB 10.4* 10/02/2013 0954   HGB 10.7* 06/02/2009 0530   HGB 11.6 03/20/2009 1454   HCT 32.1* 10/02/2013 0954   HCT 31.7* 06/02/2009 0530   HCT 33.9* 03/20/2009 1454   PLT 304 10/02/2013 0954   PLT 263 06/02/2009 0530   PLT 355 03/20/2009 1454   MCV 94.4 10/02/2013 0954   MCV 88.7 06/02/2009 0530   MCV 82 03/20/2009 1454   MCH 30.6 10/02/2013 0954   MCH 31.0 12/01/2010 1300   MCH 28.1 03/20/2009 1454   MCHC 32.4 10/02/2013 0954   MCHC 33.8 06/02/2009 0530   MCHC 34.1 03/20/2009 1454   RDW 16.2* 10/02/2013 0954   RDW 22.3* 06/02/2009 0530   RDW 14.9*  03/20/2009 1454   LYMPHSABS 1.0 10/02/2013 0954   LYMPHSABS 1.4 06/01/2009 0409   LYMPHSABS 1.3 03/20/2009 1454   MONOABS 0.8 10/02/2013 0954   MONOABS 0.5 06/01/2009 0409   EOSABS 0.1 10/02/2013 0954   EOSABS 0.0 06/01/2009 0409   EOSABS 0.1 03/20/2009 1454   BASOSABS 0.0 10/02/2013 0954   BASOSABS 0.0 06/01/2009 0409   BASOSABS 0.1 03/20/2009 1454     Chemistry       Component Value Date/Time   NA 141 09/18/2013 1015   NA 134* 02/06/2013 1632   NA 138 03/20/2009 1454   K 4.0 09/18/2013 1015   K 3.9 02/06/2013 1632   K 3.8 03/20/2009 1454   CL 107 05/01/2013 1241   CL 101 02/06/2013 1632   CL 100 03/20/2009 1454   CO2 21* 09/18/2013 1015   CO2 24 02/06/2013 1632   CO2 27 03/20/2009 1454   BUN 11.6 09/18/2013 1015   BUN 15 02/06/2013 1632   BUN 18 03/20/2009 1454   CREATININE 0.6 09/18/2013 1015   CREATININE 0.48* 02/06/2013 1632   CREATININE 0.51 11/28/2012 1401   CREATININE 0.6 03/20/2009 1454      Component Value Date/Time   CALCIUM 9.3 09/18/2013 1015   CALCIUM 9.7 02/06/2013 1632   CALCIUM 8.6 03/20/2009 1454   ALKPHOS 69 09/18/2013 1015   ALKPHOS 53 02/06/2013 1632   ALKPHOS 65 03/20/2009 1454   AST 19 09/18/2013 1015   AST 17 02/06/2013 1632   AST 14 03/20/2009 1454   ALT 15 09/18/2013 1015   ALT 15 02/06/2013 1632   ALT 21 03/20/2009 1454   BILITOT <0.20 09/18/2013 1015   BILITOT 0.1* 02/06/2013 1632   BILITOT 0.50 03/20/2009 1454       Radiological Studies: Followup MRI to be done at [facility] later this week   Impression:  #1. Recurrent GBM stable on current treatment plan. She'll ask her doctors at [facility] if she can stop the irinotecan at this time. She is at one year of therapy with this drug. I anticipate we will continue maintenance Avastin and also stop her Temodar but I will wait until her evaluation at [facility] later this week. She would also like to stop her seizure medications. She never had a seizure but continues on the [place].  #2. History of pulmonary embolus July 2010. Subsequent left lower extremity DVT. Currently off all anticoagulation.  #3. Bilateral interstitial pneumonia at time of diagnosis of her brain cancer while she was on radiation, steroids, and chemotherapy. Treated as pneumocystis. Complicated by concomitant pulmonary embolus.   CC: Patient Care Team: Abigail Miyamoto, MD as PCP - General (Family Medicine) Aldine Contes as Referring  Physician (Neurology) Blair Promise, MD as Consulting Physician (Radiation Oncology) Annia Belt, MD as Consulting Physician (Oncology)   Annia Belt, MD 11/20/20144:52 PM

## 2013-10-06 ENCOUNTER — Telehealth: Payer: Self-pay | Admitting: *Deleted

## 2013-10-06 NOTE — Telephone Encounter (Signed)
Received call from [facility] P/NP/[facility] stating that results of MRI/PET done yesterday was indeterminate & plans were discussed & due to pt's level of fatigue decided to d/c CPT-11 but continue temador daily & q 2 wk avastin.  A note from Dr Rexene Alberts should follow.

## 2013-10-16 ENCOUNTER — Encounter (INDEPENDENT_AMBULATORY_CARE_PROVIDER_SITE_OTHER): Payer: Self-pay

## 2013-10-16 ENCOUNTER — Other Ambulatory Visit (HOSPITAL_BASED_OUTPATIENT_CLINIC_OR_DEPARTMENT_OTHER): Payer: Medicare Other | Admitting: Lab

## 2013-10-16 ENCOUNTER — Ambulatory Visit (HOSPITAL_BASED_OUTPATIENT_CLINIC_OR_DEPARTMENT_OTHER): Payer: Medicare Other

## 2013-10-16 VITALS — BP 150/88 | Temp 97.7°F | Resp 18

## 2013-10-16 DIAGNOSIS — C713 Malignant neoplasm of parietal lobe: Secondary | ICD-10-CM

## 2013-10-16 DIAGNOSIS — Z5112 Encounter for antineoplastic immunotherapy: Secondary | ICD-10-CM

## 2013-10-16 DIAGNOSIS — C712 Malignant neoplasm of temporal lobe: Secondary | ICD-10-CM

## 2013-10-16 DIAGNOSIS — C711 Malignant neoplasm of frontal lobe: Secondary | ICD-10-CM

## 2013-10-16 DIAGNOSIS — C719 Malignant neoplasm of brain, unspecified: Secondary | ICD-10-CM

## 2013-10-16 LAB — CBC WITH DIFFERENTIAL/PLATELET
Basophils Absolute: 0 10*3/uL (ref 0.0–0.1)
Eosinophils Absolute: 0.1 10*3/uL (ref 0.0–0.5)
HGB: 11 g/dL — ABNORMAL LOW (ref 11.6–15.9)
LYMPH%: 23.3 % (ref 14.0–49.7)
MCV: 94.6 fL (ref 79.5–101.0)
MONO%: 15 % — ABNORMAL HIGH (ref 0.0–14.0)
NEUT#: 3.4 10*3/uL (ref 1.5–6.5)
RDW: 16 % — ABNORMAL HIGH (ref 11.2–14.5)
lymph#: 1.3 10*3/uL (ref 0.9–3.3)

## 2013-10-16 LAB — UA PROTEIN, DIPSTICK - CHCC: Protein, ur: 100 mg/dL

## 2013-10-16 MED ORDER — SODIUM CHLORIDE 0.9 % IV SOLN
Freq: Once | INTRAVENOUS | Status: AC
  Administered 2013-10-16: 12:00:00 via INTRAVENOUS

## 2013-10-16 MED ORDER — SODIUM CHLORIDE 0.9 % IV SOLN
10.0000 mg/kg | Freq: Once | INTRAVENOUS | Status: AC
  Administered 2013-10-16: 650 mg via INTRAVENOUS
  Filled 2013-10-16: qty 26

## 2013-10-16 NOTE — Patient Instructions (Signed)
[facility] Discharge Instructions for Patients Receiving Chemotherapy  Today you received the following chemotherapy agents Avastin.  To help prevent nausea and vomiting after your treatment, we encourage you to take your nausea medication as prescribed.   If you develop nausea and vomiting that is not controlled by your nausea medication, call the clinic.   BELOW ARE SYMPTOMS THAT SHOULD BE REPORTED IMMEDIATELY:  *FEVER GREATER THAN 100.5 F  *CHILLS WITH OR WITHOUT FEVER  NAUSEA AND VOMITING THAT IS NOT CONTROLLED WITH YOUR NAUSEA MEDICATION  *UNUSUAL SHORTNESS OF BREATH  *UNUSUAL BRUISING OR BLEEDING  TENDERNESS IN MOUTH AND THROAT WITH OR WITHOUT PRESENCE OF ULCERS  *URINARY PROBLEMS  *BOWEL PROBLEMS  UNUSUAL RASH Items with * indicate a potential emergency and should be followed up as soon as possible.  Feel free to call the clinic you have any questions or concerns. The clinic phone number is [phone].    

## 2013-10-19 ENCOUNTER — Encounter: Payer: Self-pay | Admitting: *Deleted

## 2013-10-19 NOTE — Progress Notes (Signed)
RECEIVED A FAX FROM [facility] CONCERNING A CONFIRMATION OF PRESCRIPTION SHIPMENT FOR TEMOZOLOMIDE ON 10/16/13.

## 2013-10-24 ENCOUNTER — Other Ambulatory Visit: Payer: Self-pay | Admitting: Oncology

## 2013-10-25 ENCOUNTER — Telehealth: Payer: Self-pay | Admitting: *Deleted

## 2013-10-25 NOTE — Telephone Encounter (Signed)
Called patient for clarification of Norvasc request.  Report the 10-02-2013 Norvasc refill request to [facility] was not received as they do not use [facility] for this medication.  Request the local [facility]  Be used and she is on this because of Avastin treatments.  Refill submitted.

## 2013-10-30 ENCOUNTER — Other Ambulatory Visit (HOSPITAL_BASED_OUTPATIENT_CLINIC_OR_DEPARTMENT_OTHER): Payer: Medicare Other

## 2013-10-30 ENCOUNTER — Telehealth: Payer: Self-pay | Admitting: Nurse Practitioner

## 2013-10-30 ENCOUNTER — Other Ambulatory Visit: Payer: Self-pay | Admitting: Nurse Practitioner

## 2013-10-30 ENCOUNTER — Ambulatory Visit (HOSPITAL_BASED_OUTPATIENT_CLINIC_OR_DEPARTMENT_OTHER): Payer: Medicare Other

## 2013-10-30 ENCOUNTER — Ambulatory Visit (HOSPITAL_BASED_OUTPATIENT_CLINIC_OR_DEPARTMENT_OTHER): Payer: Medicare Other | Admitting: Nurse Practitioner

## 2013-10-30 ENCOUNTER — Telehealth: Payer: Self-pay | Admitting: *Deleted

## 2013-10-30 VITALS — BP 138/89 | HR 69 | Temp 97.1°F | Resp 18 | Ht 63.0 in | Wt 129.6 lb

## 2013-10-30 DIAGNOSIS — C711 Malignant neoplasm of frontal lobe: Secondary | ICD-10-CM

## 2013-10-30 DIAGNOSIS — C712 Malignant neoplasm of temporal lobe: Secondary | ICD-10-CM

## 2013-10-30 DIAGNOSIS — C713 Malignant neoplasm of parietal lobe: Secondary | ICD-10-CM

## 2013-10-30 DIAGNOSIS — Z5112 Encounter for antineoplastic immunotherapy: Secondary | ICD-10-CM

## 2013-10-30 DIAGNOSIS — C719 Malignant neoplasm of brain, unspecified: Secondary | ICD-10-CM

## 2013-10-30 DIAGNOSIS — Z86711 Personal history of pulmonary embolism: Secondary | ICD-10-CM

## 2013-10-30 LAB — CBC WITH DIFFERENTIAL/PLATELET
Basophils Absolute: 0 10*3/uL (ref 0.0–0.1)
EOS%: 2.5 % (ref 0.0–7.0)
Eosinophils Absolute: 0.2 10*3/uL (ref 0.0–0.5)
HCT: 35.3 % (ref 34.8–46.6)
HGB: 11.6 g/dL (ref 11.6–15.9)
LYMPH%: 25.6 % (ref 14.0–49.7)
MCH: 31.2 pg (ref 25.1–34.0)
MCV: 94.9 fL (ref 79.5–101.0)
MONO%: 13.3 % (ref 0.0–14.0)
NEUT%: 58.3 % (ref 38.4–76.8)
Platelets: 331 10*3/uL (ref 145–400)
RDW: 15.3 % — ABNORMAL HIGH (ref 11.2–14.5)

## 2013-10-30 LAB — COMPREHENSIVE METABOLIC PANEL (CC13)
AST: 25 U/L (ref 5–34)
Albumin: 2.9 g/dL — ABNORMAL LOW (ref 3.5–5.0)
Alkaline Phosphatase: 64 U/L (ref 40–150)
Anion Gap: 8 mEq/L (ref 3–11)
BUN: 15 mg/dL (ref 7.0–26.0)
CO2: 21 mEq/L — ABNORMAL LOW (ref 22–29)
Calcium: 9 mg/dL (ref 8.4–10.4)
Creatinine: 0.6 mg/dL (ref 0.6–1.1)
Glucose: 85 mg/dl (ref 70–140)
Sodium: 133 mEq/L — ABNORMAL LOW (ref 136–145)
Total Protein: 6.3 g/dL — ABNORMAL LOW (ref 6.4–8.3)

## 2013-10-30 LAB — UA PROTEIN, DIPSTICK - CHCC: Protein, ur: 100 mg/dL

## 2013-10-30 MED ORDER — SODIUM CHLORIDE 0.9 % IV SOLN
10.0000 mg/kg | Freq: Once | INTRAVENOUS | Status: AC
  Administered 2013-10-30: 600 mg via INTRAVENOUS
  Filled 2013-10-30: qty 24

## 2013-10-30 MED ORDER — SODIUM CHLORIDE 0.9 % IV SOLN
Freq: Once | INTRAVENOUS | Status: AC
  Administered 2013-10-30: 14:00:00 via INTRAVENOUS

## 2013-10-30 NOTE — Telephone Encounter (Signed)
appts made per 12/15 POF Email to MW to add txs AVS and Cal given to pt shh

## 2013-10-30 NOTE — Patient Instructions (Signed)
[facility] Discharge Instructions for Patients Receiving Chemotherapy  Today you received the following chemotherapy agents: Avastin.  To help prevent nausea and vomiting after your treatment, we encourage you to take your nausea medication: Zofran 8 mg every 8 hours as needed.   If you develop nausea and vomiting that is not controlled by your nausea medication, call the clinic.   BELOW ARE SYMPTOMS THAT SHOULD BE REPORTED IMMEDIATELY:  *FEVER GREATER THAN 100.5 F  *CHILLS WITH OR WITHOUT FEVER  NAUSEA AND VOMITING THAT IS NOT CONTROLLED WITH YOUR NAUSEA MEDICATION  *UNUSUAL SHORTNESS OF BREATH  *UNUSUAL BRUISING OR BLEEDING  TENDERNESS IN MOUTH AND THROAT WITH OR WITHOUT PRESENCE OF ULCERS  *URINARY PROBLEMS  *BOWEL PROBLEMS  UNUSUAL RASH Items with * indicate a potential emergency and should be followed up as soon as possible.  Feel free to call the clinic you have any questions or concerns. The clinic phone number is [phone].    

## 2013-10-30 NOTE — Progress Notes (Signed)
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 She underwent debulking surgery. She was initially treated on a clinical trial of Temodar, irinotecan and Avastin. Course was complicated by bilateral pulmonary emboli and diffuse bilateral interstitial pulmonary infiltrates, suspected pneumocystis pneumonia in April 2010. She was subsequently treated off protocol with cranial radiation and concomitant low-dose oral Temodar 06/05/2009 through 07/16/2009. She was then treated with maintenance Temodar 200 mg per meter square for 5 days each month. There was evidence of early progression while on Temodar in December 2010. Avastin was resumed at that time with disease stabilization. She completed 12 cycles of adjuvant Temodar plus Avastin 08/11/2010. She then began maintenance Avastin 15 mg per kilogram every 4 weeks.   Followup MRI of the brain done at [facility] on 08/29/2012 showed signs of progression. It was recommended to start her back on Temodar 80 mg daily and to increase Avastin back to 10 mg per kilogram IV every 2 weeks.   We received a call from [facility] on 09/22/2012 that the tumor was growing and they advised adding irinotecan 125 mg per meter squared with the next Avastin. Irinotecan was added on a 2 week schedule beginning 10/03/2012.   We received a call from the [facility] on 10/06/2013 reporting the results of an MRI/PET done 10/05/2013 were indeterminate. Due to the patient's level of fatigue it was decided to discontinue irinotecan and continue daily Temodar and every 2 week Avastin.  She is seen today for scheduled followup. She notes that her hair is growing sincere irinotecan was discontinued. She denies nausea/vomiting. No constipation or diarrhea. She has noted no improvement in her energy level. No shortness of  breath or cough. No falls. She denies any unusual headaches. She has a stable left visual field deficit. She has occasional neck and lower back pain. The pain is intermittently relieved with Tylenol.   Objective: Blood pressure 138/89, pulse 69, temperature 97.1 F (36.2 C), temperature source Oral, resp. rate 18, height 5\' 3"  (1.6 m), weight 129 lb 9.6 oz (58.786 kg).  Pupils equal round and reactive to light. Decreased peripheral vision left eye. No palpable cervical, subclavicular axillary lymph nodes. Lungs are clear. No wheezes or rales. Regular cardiac rhythm. Abdomen soft and nontender. No hepatomegaly. Bilateral lower leg edema left greater than right. Calves soft and nontender. Motor strength 5 over 5. Knee DTRs 2+, symmetric. Finger to nose intact. Alert and oriented.  Lab Results: Lab Results  Component Value Date   WBC 6.3 10/30/2013   HGB 11.6 10/30/2013   HCT 35.3 10/30/2013   MCV 94.9 10/30/2013   PLT 331 10/30/2013    Chemistry:    Chemistry      Component Value Date/Time   NA 141 09/18/2013 1015   NA 134* 02/06/2013 1632   NA 138 03/20/2009 1454   K 4.0 09/18/2013 1015   K 3.9 02/06/2013 1632   K 3.8 03/20/2009 1454   CL 107 05/01/2013 1241   CL 101 02/06/2013 1632   CL 100 03/20/2009 1454   CO2 21* 09/18/2013 1015   CO2 24 02/06/2013 1632   CO2 27 03/20/2009 1454   BUN 11.6 09/18/2013 1015   BUN 15 02/06/2013 1632   BUN 18 03/20/2009 1454   CREATININE 0.6 09/18/2013 1015   CREATININE 0.48* 02/06/2013 1632   CREATININE 0.51 11/28/2012  1401   CREATININE 0.6 03/20/2009 1454      Component Value Date/Time   CALCIUM 9.3 09/18/2013 1015   CALCIUM 9.7 02/06/2013 1632   CALCIUM 8.6 03/20/2009 1454   ALKPHOS 69 09/18/2013 1015   ALKPHOS 53 02/06/2013 1632   ALKPHOS 65 03/20/2009 1454   AST 19 09/18/2013 1015   AST 17 02/06/2013 1632   AST 14 03/20/2009 1454   ALT 15 09/18/2013 1015   ALT 15 02/06/2013 1632   ALT 21 03/20/2009 1454   BILITOT <0.20 09/18/2013 1015   BILITOT 0.1* 02/06/2013 1632    BILITOT 0.50 03/20/2009 1454       Studies/Results: No results found.  Medications: I have reviewed the patient's current medications.  Assessment/Plan:  1. 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 She underwent debulking surgery. She was initially treated on a clinical trial with Temodar, irinotecan and Avastin. Course was complicated by development of bilateral pulmonary emboli and diffuse bilateral interstitial pulmonary infiltrates and suspected pneumocystis pneumonia April 2010. She was subsequently treated off protocol with cranial radiation with concomitant low-dose oral Temodar 07/21 through 07/16/2009. Subsequent maintenance Temodar 200 mg per meter squared 5 days each month. There was early progression while on Temodar December 2010. Avastin was added back at that time with disease stabilization. She completed 12 cycles of adjuvant Temodar plus Avastin 08/11/2010. Maintenance Avastin continued every 4 weeks. [facility] the brain [facility] on 05/26/2012 with no evidence of disease progression. MRI of the brain at [facility] on 08/29/2012 with evidence of disease progression. Temodar resumed at a daily dose and Avastin change to 10 mg per kilogram IV every 2 weeks. Irinotecan added on a 2 week schedule beginning 10/03/2012. Restaging brain MRI 02/09/2013 with stable disease. Most recent study 10/05/2013 "indeterminate". Irinotecan discontinued due to fatigue. Daily Temodar and every 2 week Avastin continued. 2. Bilateral pulmonary emboli occurring in July 2010. Coumadin was discontinued following an office visit 10/17/2012. 3. History of bilateral interstitial pneumonia occurring around the time of initial diagnosis of GBM. She was treated empirically for Pneumocystis. 4. Asymmetric leg edema 11/04/2012. Bilateral venous Doppler negative for DVT. 5. Intermittent  proteinuria related to Avastin. Urine protein measured at 100 mg/dL today. She had a 24-hour urine in December 2013 after the urine protein returned at 300 mg/dL. The 24-hour urine showed 572 mg of protein excretion. 6. Left lower extremity pain and swelling 02/06/2013. Negative venous duplex Doppler.  Disposition-she appears stable. Plan to continue daily Temodar and every 2 week Avastin as per [facility]. She will return for a followup visit in approximately one month. She will contact the office in the interim with any problems.   Ned Card ANP/GNP-BC

## 2013-10-30 NOTE — Telephone Encounter (Signed)
Per staff message and POF I have scheduled appts.  JMW  

## 2013-10-31 ENCOUNTER — Telehealth: Payer: Self-pay | Admitting: Dietician

## 2013-10-31 NOTE — Telephone Encounter (Signed)
Brief Outpatient Oncology Nutrition Note  Patient has been identified to be at risk on malnutrition screen.  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  Called and spoke with patient's husband regarding patient's intake and weight.  Patient has lost 9% of her usual weight  In the past 9 months.  Husband reports that patient is eating well and has no questions at this time.    Oran Rein, RD, LDN

## 2013-11-06 ENCOUNTER — Other Ambulatory Visit: Payer: Self-pay | Admitting: Oncology

## 2013-11-10 ENCOUNTER — Ambulatory Visit: Payer: Medicare Other

## 2013-11-13 ENCOUNTER — Other Ambulatory Visit (HOSPITAL_BASED_OUTPATIENT_CLINIC_OR_DEPARTMENT_OTHER): Payer: Medicare Other

## 2013-11-13 ENCOUNTER — Other Ambulatory Visit: Payer: Self-pay | Admitting: Nurse Practitioner

## 2013-11-13 ENCOUNTER — Ambulatory Visit: Payer: Medicare Other

## 2013-11-13 DIAGNOSIS — C712 Malignant neoplasm of temporal lobe: Secondary | ICD-10-CM

## 2013-11-13 DIAGNOSIS — C713 Malignant neoplasm of parietal lobe: Secondary | ICD-10-CM

## 2013-11-13 DIAGNOSIS — R809 Proteinuria, unspecified: Secondary | ICD-10-CM

## 2013-11-13 DIAGNOSIS — C711 Malignant neoplasm of frontal lobe: Secondary | ICD-10-CM

## 2013-11-13 LAB — CBC WITH DIFFERENTIAL/PLATELET
BASO%: 0.2 % (ref 0.0–2.0)
Eosinophils Absolute: 0.3 10*3/uL (ref 0.0–0.5)
LYMPH%: 15.8 % (ref 14.0–49.7)
MCHC: 32.9 g/dL (ref 31.5–36.0)
MONO#: 1.1 10*3/uL — ABNORMAL HIGH (ref 0.1–0.9)
RBC: 3.74 10*6/uL (ref 3.70–5.45)
RDW: 14.8 % — ABNORMAL HIGH (ref 11.2–14.5)
WBC: 9.2 10*3/uL (ref 3.9–10.3)
lymph#: 1.5 10*3/uL (ref 0.9–3.3)
nRBC: 0 % (ref 0–0)

## 2013-11-13 NOTE — Progress Notes (Signed)
Per Lonna Cobb, NP Hold tx today.  Instructed pt to go by lab and pick up container for 24 hour urine collection.  Pt and husband refused; stated they wanted to do 24 hour collection Sunday and return on Monday to drop it off and then get treatment.  Per Lonna Cobb, NP; informed pt that 24 hours urine results will not be available that fast & pt could start collection Tuesday; bring back Wednesday and then Dr. Cyndie Chime will be back in office and will follow-up with them.  Pt and husband stated they do no want to do this and pt stated "I'll just skip this one and be back in two weeks and bring a regular sample then.  Lonna Cobb, NP made aware.

## 2013-11-15 ENCOUNTER — Encounter: Payer: Self-pay | Admitting: *Deleted

## 2013-11-15 NOTE — Progress Notes (Signed)
RECEIVED A FAX FROM [facility] CONCERNING A CONFIRMATION OF PRESCRIPTION SHIPMENT FOR TEMOZOLOMIDE ON 11/14/13.

## 2013-11-27 ENCOUNTER — Ambulatory Visit (HOSPITAL_BASED_OUTPATIENT_CLINIC_OR_DEPARTMENT_OTHER): Payer: Medicare Other | Admitting: Oncology

## 2013-11-27 ENCOUNTER — Ambulatory Visit: Payer: Medicare Other

## 2013-11-27 ENCOUNTER — Telehealth: Payer: Self-pay | Admitting: Oncology

## 2013-11-27 ENCOUNTER — Telehealth: Payer: Self-pay | Admitting: *Deleted

## 2013-11-27 ENCOUNTER — Other Ambulatory Visit (HOSPITAL_BASED_OUTPATIENT_CLINIC_OR_DEPARTMENT_OTHER): Payer: Medicare Other

## 2013-11-27 VITALS — BP 135/81 | HR 91 | Temp 97.9°F | Resp 20 | Ht 63.0 in | Wt 128.3 lb

## 2013-11-27 DIAGNOSIS — E039 Hypothyroidism, unspecified: Secondary | ICD-10-CM

## 2013-11-27 DIAGNOSIS — Z5112 Encounter for antineoplastic immunotherapy: Secondary | ICD-10-CM

## 2013-11-27 DIAGNOSIS — C711 Malignant neoplasm of frontal lobe: Secondary | ICD-10-CM

## 2013-11-27 DIAGNOSIS — C712 Malignant neoplasm of temporal lobe: Secondary | ICD-10-CM

## 2013-11-27 DIAGNOSIS — Z86711 Personal history of pulmonary embolism: Secondary | ICD-10-CM

## 2013-11-27 DIAGNOSIS — C713 Malignant neoplasm of parietal lobe: Secondary | ICD-10-CM

## 2013-11-27 DIAGNOSIS — C719 Malignant neoplasm of brain, unspecified: Secondary | ICD-10-CM

## 2013-11-27 LAB — CBC WITH DIFFERENTIAL/PLATELET
BASO%: 0.2 % (ref 0.0–2.0)
Basophils Absolute: 0 10*3/uL (ref 0.0–0.1)
EOS%: 1.6 % (ref 0.0–7.0)
Eosinophils Absolute: 0.1 10*3/uL (ref 0.0–0.5)
HCT: 35.8 % (ref 34.8–46.6)
HGB: 11.5 g/dL — ABNORMAL LOW (ref 11.6–15.9)
LYMPH%: 13 % — AB (ref 14.0–49.7)
MCH: 30.3 pg (ref 25.1–34.0)
MCHC: 32.1 g/dL (ref 31.5–36.0)
MCV: 94.5 fL (ref 79.5–101.0)
MONO#: 0.9 10*3/uL (ref 0.1–0.9)
MONO%: 11.3 % (ref 0.0–14.0)
NEUT#: 6.1 10*3/uL (ref 1.5–6.5)
NEUT%: 73.9 % (ref 38.4–76.8)
PLATELETS: 494 10*3/uL — AB (ref 145–400)
RBC: 3.79 10*6/uL (ref 3.70–5.45)
RDW: 14.7 % — ABNORMAL HIGH (ref 11.2–14.5)
WBC: 8.3 10*3/uL (ref 3.9–10.3)
lymph#: 1.1 10*3/uL (ref 0.9–3.3)

## 2013-11-27 LAB — COMPREHENSIVE METABOLIC PANEL (CC13)
ALK PHOS: 95 U/L (ref 40–150)
ALT: 10 U/L (ref 0–55)
AST: 17 U/L (ref 5–34)
Albumin: 2.3 g/dL — ABNORMAL LOW (ref 3.5–5.0)
Anion Gap: 12 mEq/L — ABNORMAL HIGH (ref 3–11)
BUN: 11.5 mg/dL (ref 7.0–26.0)
CO2: 22 mEq/L (ref 22–29)
CREATININE: 0.8 mg/dL (ref 0.6–1.1)
Calcium: 9.5 mg/dL (ref 8.4–10.4)
Chloride: 102 mEq/L (ref 98–109)
Glucose: 98 mg/dl (ref 70–140)
Potassium: 4.9 mEq/L (ref 3.5–5.1)
Sodium: 136 mEq/L (ref 136–145)
Total Bilirubin: <0.2 mg/dL (ref 0.20–1.20)
Total Protein: 7 g/dL (ref 6.4–8.3)

## 2013-11-27 LAB — UA PROTEIN, DIPSTICK - CHCC: Protein, ur: 100 mg/dL

## 2013-11-27 MED ORDER — SODIUM CHLORIDE 0.9 % IV SOLN
Freq: Once | INTRAVENOUS | Status: AC
  Administered 2013-11-27: 12:00:00 via INTRAVENOUS

## 2013-11-27 MED ORDER — SODIUM CHLORIDE 0.9 % IV SOLN
10.0000 mg/kg | Freq: Once | INTRAVENOUS | Status: AC
  Administered 2013-11-27: 600 mg via INTRAVENOUS
  Filled 2013-11-27: qty 24

## 2013-11-27 NOTE — Progress Notes (Signed)
Hematology and Oncology Follow Up Visit  Mercedes Barnes [id number] 07-20-1961 53 y.o. 11/27/2013 7:04 PM   Principle Diagnosis: Encounter Diagnoses  Name Primary?  . Glioblastoma multiforme of temporal lobe Yes  . Hypothyroid      Interim History: Followup visit for this pleasant 53 year old woman with a history of a cystic glioblastoma multiforme of the right frontal and parietal lobes of her brain with extension to the basal ganglia initially diagnosed in April 2010. After a brief treatment on a clinical trial at [facility] , due to complications and further progression, she was treated with standard Temodar/radiation and achieved an excellent stabilization. She had her first progression in December 2010. She was put back on treatment with Temodar. She was far enough out from her pulmonary embolism and still on full dose anticoagulation and the [place] physicians felt it would be safe to put her on Avastin as well. She had a prolonged remission with this regimen and was continued on maintenance Avastin. She had a second progression in October 2013. A regimen of Temodar, Camptosar, and Avastin was recommended and initiated. She  tolerated this combination quite well with minimal myelosuppression or any other toxicities. She has had stable disease on serial two-month scans done at [facility] including most recent study done on 10/05/2013.  Marland Kitchen There still residual tumor in the area of the basal ganglia. Due to increasing fatigue and the fact that she received the irinotecan/Temodar/Avastin for one year, we get the okay from [facility] to stop her irinotecan and last dose was given on 09/18/2013. She continues on Temodar 80 mg daily and Avastin 10 mg per kilogram IV every 2 weeks. There have been fluctuations in her urine protein. She has a chronic left lateral visual field cut. Changes in her vision affect her gait.   Husband reports that since her visit here on December 15, she is having more problems with concentration  and more problems with coordination of her left upper extremity. She is dropping objects. He also feels that her left lateral visual field cut is getting worse and that her vision is "more narrow". She's not having any headaches. No seizure activity. No interim infections. She is still quite interactive.       Medications: reviewed  Allergies:  Allergies  Allergen Reactions  . Co-Trimoxazole Injection [Sulfamethoxazole-Trimethoprim] Hives  . Pentamidine     Hypotension & weakness    Review of Systems: Hematology:  No bleeding ENT ROS: No sore throat Breast ROS:  Respiratory ROS: No cough or dyspnea Cardiovascular ROS:   No chest pain or palpitations Gastrointestinal ROS:   No abdominal pain or change in bowel habit Genito-Urinary ROS: No urinary tract symptoms Musculoskeletal ROS: No muscle or bone pain Neurological ROS: See above Dermatological ROS: No rash Remaining ROS negative.  Physical Exam: Blood pressure 135/81, pulse 91, temperature 97.9 F (36.6 C), temperature source Oral, resp. rate 20, height 5\' 3"  (1.6 m), weight 128 lb 4.8 oz (58.196 kg), SpO2 96.00%. Wt Readings from Last 3 Encounters:  11/27/13 128 lb 4.8 oz (58.196 kg)  10/30/13 129 lb 9.6 oz (58.786 kg)  10/02/13 130 lb 4.8 oz (59.104 kg)     General appearance: Frail Caucasian woman [name]: Pharynx no erythema, exudate, mass, or ulcer. No thyromegaly or thyroid nodules Lymph nodes: No cervical, supraclavicular, or axillary lymphadenopathy Breasts:  Lungs: Clear to auscultation, resonant to percussion throughout Heart: Regular rhythm, no murmur, no gallop, no rub, no click, no edema Abdomen: Soft, nontender, normal bowel sounds, no mass,  no organomegaly Extremities: No edema, no calf tenderness Musculoskeletal: no joint deformities GU Vascular: Carotid pulses 2+, no bruits, Neurologic: Alert, oriented, she was able to do simple calculations. She was able to remember two out of three items after  approximate three-minute interval. left pupil 2 mm greater than right pupil. Both reactive. optic discs sharp and vessels normal, no hemorrhage or exudate, cranial nerves grossly normal, motor strength 5 over 5, reflexes 1+ symmetric, upper body coordination clumsiness on finger to finger and rapid alternating movements of her left hand. Hesitant gait. Skin: No rash or ecchymosis   Lab Results: CBC W/Diff    Component Value Date/Time   WBC 8.3 11/27/2013 1009   WBC 10.3 06/02/2009 0530   WBC 9.5 03/20/2009 1454   RBC 3.79 11/27/2013 1009   RBC 3.57* 06/02/2009 0530   HGB 11.5* 11/27/2013 1009   HGB 10.7* 06/02/2009 0530   HGB 11.6 03/20/2009 1454   HCT 35.8 11/27/2013 1009   HCT 31.7* 06/02/2009 0530   HCT 33.9* 03/20/2009 1454   PLT 494* 11/27/2013 1009   PLT 263 06/02/2009 0530   PLT 355 03/20/2009 1454   MCV 94.5 11/27/2013 1009   MCV 88.7 06/02/2009 0530   MCV 82 03/20/2009 1454   MCH 30.3 11/27/2013 1009   MCH 31.0 12/01/2010 1300   MCH 28.1 03/20/2009 1454   MCHC 32.1 11/27/2013 1009   MCHC 33.8 06/02/2009 0530   MCHC 34.1 03/20/2009 1454   RDW 14.7* 11/27/2013 1009   RDW 22.3* 06/02/2009 0530   RDW 14.9* 03/20/2009 1454   LYMPHSABS 1.1 11/27/2013 1009   LYMPHSABS 1.4 06/01/2009 0409   LYMPHSABS 1.3 03/20/2009 1454   MONOABS 0.9 11/27/2013 1009   MONOABS 0.5 06/01/2009 0409   EOSABS 0.1 11/27/2013 1009   EOSABS 0.0 06/01/2009 0409   EOSABS 0.1 03/20/2009 1454   BASOSABS 0.0 11/27/2013 1009   BASOSABS 0.0 06/01/2009 0409   BASOSABS 0.1 03/20/2009 1454     Chemistry      Component Value Date/Time   NA 136 11/27/2013 1009   NA 134* 02/06/2013 1632   NA 138 03/20/2009 1454   K 4.9 11/27/2013 1009   K 3.9 02/06/2013 1632   K 3.8 03/20/2009 1454   CL 107 05/01/2013 1241   CL 101 02/06/2013 1632   CL 100 03/20/2009 1454   CO2 22 11/27/2013 1009   CO2 24 02/06/2013 1632   CO2 27 03/20/2009 1454   BUN 11.5 11/27/2013 1009   BUN 15 02/06/2013 1632   BUN 18 03/20/2009 1454   CREATININE 0.8 11/27/2013 1009   CREATININE 0.48*  02/06/2013 1632   CREATININE 0.51 11/28/2012 1401   CREATININE 0.6 03/20/2009 1454      Component Value Date/Time   CALCIUM 9.5 11/27/2013 1009   CALCIUM 9.7 02/06/2013 1632   CALCIUM 8.6 03/20/2009 1454   ALKPHOS 95 11/27/2013 1009   ALKPHOS 53 02/06/2013 1632   ALKPHOS 65 03/20/2009 1454   AST 17 11/27/2013 1009   AST 17 02/06/2013 1632   AST 14 03/20/2009 1454   ALT 10 11/27/2013 1009   ALT 15 02/06/2013 1632   ALT 21 03/20/2009 1454   BILITOT <0.20 11/27/2013 1009   BILITOT 0.1* 02/06/2013 1632   BILITOT 0.50 03/20/2009 1454         Impression: #1. Relapsed cystic glioblastoma treated as outlined above. Early clinical signs that she is breaking through treatment again with deteriorating vision, unequal pupils, increased clumsiness of her left upper extremity, and  decreasing her concentration. She is due for a reevaluation at [facility] this week to include an MRI. I will wait for this evaluation and any recommendations from the neuro-oncologist at [facility]. All things considered, she is out almost 5 years which is quite remarkable for this type of cancer.   #2. History of pulmonary embolus July 2010. Subsequent left lower extremity DVT. Currently off all anticoagulation.   #3. Bilateral interstitial pneumonia at time of diagnosis of her brain cancer while she was on radiation, steroids, and chemotherapy. Treated as pneumocystis. Complicated by concomitant pulmonary embolus.    CC: Patient Care Team: Abigail Miyamoto, MD as PCP - General (Family Medicine) Aldine Contes as Referring Physician (Neurology) Blair Promise, MD as Consulting Physician (Radiation Oncology) Annia Belt, MD as Consulting Physician (Oncology)   Annia Belt, MD 1/12/20157:04 PM

## 2013-11-27 NOTE — Telephone Encounter (Signed)
Emailed Sharyn Lull regarding chemo, pt's husband will get calendar form Sharyn Lull @ chemo room for  january and February 2015

## 2013-11-27 NOTE — Telephone Encounter (Signed)
Per staff message and POF I have scheduled appts.  JMW  

## 2013-11-27 NOTE — Patient Instructions (Signed)
[facility] Discharge Instructions for Patients Receiving Chemotherapy  Today you received the following chemotherapy agents Avastin.  To help prevent nausea and vomiting after your treatment, we encourage you to take your nausea medication as prescribed.   If you develop nausea and vomiting that is not controlled by your nausea medication, call the clinic.   BELOW ARE SYMPTOMS THAT SHOULD BE REPORTED IMMEDIATELY:  *FEVER GREATER THAN 100.5 F  *CHILLS WITH OR WITHOUT FEVER  NAUSEA AND VOMITING THAT IS NOT CONTROLLED WITH YOUR NAUSEA MEDICATION  *UNUSUAL SHORTNESS OF BREATH  *UNUSUAL BRUISING OR BLEEDING  TENDERNESS IN MOUTH AND THROAT WITH OR WITHOUT PRESENCE OF ULCERS  *URINARY PROBLEMS  *BOWEL PROBLEMS  UNUSUAL RASH Items with * indicate a potential emergency and should be followed up as soon as possible.  Feel free to call the clinic you have any questions or concerns. The clinic phone number is [phone].    

## 2013-11-28 ENCOUNTER — Telehealth: Payer: Self-pay | Admitting: Oncology

## 2013-11-28 NOTE — Telephone Encounter (Signed)
talked to pt and and gave her appt for for January 2015

## 2013-12-01 ENCOUNTER — Encounter: Payer: Self-pay | Admitting: *Deleted

## 2013-12-01 ENCOUNTER — Other Ambulatory Visit: Payer: Self-pay | Admitting: *Deleted

## 2013-12-01 DIAGNOSIS — C712 Malignant neoplasm of temporal lobe: Secondary | ICD-10-CM

## 2013-12-01 MED ORDER — LOMUSTINE 100 MG PO CAPS: 100.0000 mg | ORAL_CAPSULE | Freq: Once | ORAL | Status: DC

## 2013-12-01 MED ORDER — LOMUSTINE 10 MG PO CAPS: 10.0000 mg | ORAL_CAPSULE | Freq: Once | ORAL | Status: DC

## 2013-12-04 ENCOUNTER — Telehealth: Payer: Self-pay | Admitting: *Deleted

## 2013-12-04 NOTE — Telephone Encounter (Signed)
Received call from [facility] stating that pt's copay for lomustine would be $142.50 & wanted to know if OK to search for pt assistance.  Informed OK & they will let us know when script dispensed.

## 2013-12-05 ENCOUNTER — Telehealth: Payer: Self-pay | Admitting: *Deleted

## 2013-12-05 NOTE — Telephone Encounter (Signed)
Received vm call from Brittainy/[facility] stating that lomustine 100 mg dose is on back order & needs OK to dispense all 10 mg tabs.  Returned call & informed that this is OK.  She reports that the 100 mg & 40 mg dose are on back-order & hopefully will be available mid feb & we can then change back to 100 mg tabs plus 10 mg tabs to = 130 mg.  This nurse will notify pt./husband. Discussed with husband & he will give pt antiemetic 30-60 minutes before taking chemo on an empty stomach at hs.

## 2013-12-07 ENCOUNTER — Telehealth: Payer: Self-pay | Admitting: *Deleted

## 2013-12-07 NOTE — Telephone Encounter (Signed)
RECEIVED A FAX FROM [facility] CONCERNING A CONFIRMATION OF PRESCRIPTION SHIPMENT FOR LOMUSTINE ON 12/06/13.

## 2013-12-07 NOTE — Telephone Encounter (Signed)
Sharee Pimple NP @ [facility] is calling to follow up on her phone call from 11-30-13 to [place] [id number] MRI is worse.  Let her know that it looks like temodar was discontinued on 12-01-13 and script was sent to [facility] for lomustine.  Discussed with her the problems documented with the lomustine being back ordered.  Sharee Pimple just wanted to check to see if Dr.Granfortuna had gotten the recommendations as she has been out of the office and had not heard anything.  She was glad to know that patient had these changes in place.

## 2013-12-11 ENCOUNTER — Ambulatory Visit (HOSPITAL_BASED_OUTPATIENT_CLINIC_OR_DEPARTMENT_OTHER): Payer: Medicare Other

## 2013-12-11 ENCOUNTER — Other Ambulatory Visit (HOSPITAL_BASED_OUTPATIENT_CLINIC_OR_DEPARTMENT_OTHER): Payer: Medicare Other

## 2013-12-11 VITALS — BP 146/81 | HR 85 | Temp 97.6°F | Resp 18

## 2013-12-11 DIAGNOSIS — C711 Malignant neoplasm of frontal lobe: Secondary | ICD-10-CM

## 2013-12-11 DIAGNOSIS — C719 Malignant neoplasm of brain, unspecified: Secondary | ICD-10-CM

## 2013-12-11 DIAGNOSIS — C712 Malignant neoplasm of temporal lobe: Secondary | ICD-10-CM

## 2013-12-11 DIAGNOSIS — Z5112 Encounter for antineoplastic immunotherapy: Secondary | ICD-10-CM

## 2013-12-11 DIAGNOSIS — R809 Proteinuria, unspecified: Secondary | ICD-10-CM

## 2013-12-11 DIAGNOSIS — E039 Hypothyroidism, unspecified: Secondary | ICD-10-CM

## 2013-12-11 DIAGNOSIS — C713 Malignant neoplasm of parietal lobe: Secondary | ICD-10-CM

## 2013-12-11 LAB — CBC WITH DIFFERENTIAL/PLATELET
BASO%: 0.4 % (ref 0.0–2.0)
BASOS ABS: 0 10*3/uL (ref 0.0–0.1)
EOS ABS: 0.1 10*3/uL (ref 0.0–0.5)
EOS%: 2.6 % (ref 0.0–7.0)
HCT: 36 % (ref 34.8–46.6)
HEMOGLOBIN: 11.9 g/dL (ref 11.6–15.9)
LYMPH%: 21.6 % (ref 14.0–49.7)
MCH: 30.1 pg (ref 25.1–34.0)
MCHC: 33.1 g/dL (ref 31.5–36.0)
MCV: 91.1 fL (ref 79.5–101.0)
MONO#: 0.6 10*3/uL (ref 0.1–0.9)
MONO%: 11.5 % (ref 0.0–14.0)
NEUT%: 63.9 % (ref 38.4–76.8)
NEUTROS ABS: 3.5 10*3/uL (ref 1.5–6.5)
PLATELETS: 456 10*3/uL — AB (ref 145–400)
RBC: 3.95 10*6/uL (ref 3.70–5.45)
RDW: 14.4 % (ref 11.2–14.5)
WBC: 5.5 10*3/uL (ref 3.9–10.3)
lymph#: 1.2 10*3/uL (ref 0.9–3.3)
nRBC: 0 % (ref 0–0)

## 2013-12-11 LAB — TSH CHCC: TSH: 10.605 m(IU)/L — ABNORMAL HIGH (ref 0.308–3.960)

## 2013-12-11 LAB — UA PROTEIN, DIPSTICK - CHCC: Protein, ur: 100 mg/dL

## 2013-12-11 LAB — T4, FREE: Free T4: 1.25 ng/dL (ref 0.80–1.80)

## 2013-12-11 MED ORDER — SODIUM CHLORIDE 0.9 % IV SOLN
10.0000 mg/kg | Freq: Once | INTRAVENOUS | Status: AC
  Administered 2013-12-11: 600 mg via INTRAVENOUS
  Filled 2013-12-11: qty 24

## 2013-12-11 MED ORDER — SODIUM CHLORIDE 0.9 % IV SOLN
Freq: Once | INTRAVENOUS | Status: AC
  Administered 2013-12-11: 12:00:00 via INTRAVENOUS

## 2013-12-11 NOTE — Progress Notes (Signed)
Urine protein-100. Per pharmacy, okay to treat per Dr. Beryle Beams if urine protein 2+ or less.

## 2013-12-11 NOTE — Patient Instructions (Signed)
[facility] Discharge Instructions for Patients Receiving Chemotherapy  Today you received the following chemotherapy agents: Avastin  To help prevent nausea and vomiting after your treatment, we encourage you to take your nausea medication as prescribed by your physician.   If you develop nausea and vomiting that is not controlled by your nausea medication, call the clinic.   BELOW ARE SYMPTOMS THAT SHOULD BE REPORTED IMMEDIATELY:  *FEVER GREATER THAN 100.5 F  *CHILLS WITH OR WITHOUT FEVER  NAUSEA AND VOMITING THAT IS NOT CONTROLLED WITH YOUR NAUSEA MEDICATION  *UNUSUAL SHORTNESS OF BREATH  *UNUSUAL BRUISING OR BLEEDING  TENDERNESS IN MOUTH AND THROAT WITH OR WITHOUT PRESENCE OF ULCERS  *URINARY PROBLEMS  *BOWEL PROBLEMS  UNUSUAL RASH Items with * indicate a potential emergency and should be followed up as soon as possible.  Feel free to call the clinic you have any questions or concerns. The clinic phone number is [phone].    

## 2013-12-12 ENCOUNTER — Telehealth: Payer: Self-pay | Admitting: *Deleted

## 2013-12-12 NOTE — Telephone Encounter (Signed)
Per MD; spoke with pt's husband Mercedes Barnes and he confirmed pt is taking her synthroid 100 mcg daily; her PCP,  Dr. Maceo Pro, prescribes this med for her .  Informed pt's husband that TSH level is up and he will need to call PCP for dose adjustment and this office will send lab results to PCP.  Pt's husband verbalized understanding of instructions.

## 2013-12-12 NOTE — Telephone Encounter (Signed)
Message copied by Domenic Schwab on Tue Dec 12, 2013  1:26 PM ------      Message from: Annia Belt      Created: Mon Dec 11, 2013  7:38 PM       Please see if pt taking her thyroid medication as prescribed. TSH is up.  If she has been taking her meds properly (100 mcg QD)  She will need a dose adjustment -who prescribes this for her?  They should be the one to adjust.  Primary is Dr Consuelo Pandy ------

## 2013-12-18 ENCOUNTER — Other Ambulatory Visit: Payer: Self-pay | Admitting: *Deleted

## 2013-12-18 ENCOUNTER — Telehealth: Payer: Self-pay | Admitting: *Deleted

## 2013-12-18 DIAGNOSIS — C712 Malignant neoplasm of temporal lobe: Secondary | ICD-10-CM

## 2013-12-18 NOTE — Telephone Encounter (Signed)
Spoke with Wilfrid Lund [facility] concerning the possibility of a referral while the patient remains on avastin(IV) and lomustine(oral).  She conferred with Dr. Lyman Speller and another physician and the consensus was that they will be glad to accept this patient once she does not need a refill of the lomustine and the avastin is no longer given.  Will let Dr. Beryle Beams know when he returns.

## 2013-12-18 NOTE — Progress Notes (Signed)
Spoke with Lucretia @ [facility].  Orders placed in Epic for wheelchair and bedside commonde

## 2013-12-20 ENCOUNTER — Other Ambulatory Visit: Payer: Self-pay | Admitting: *Deleted

## 2013-12-20 DIAGNOSIS — C712 Malignant neoplasm of temporal lobe: Secondary | ICD-10-CM

## 2013-12-20 MED ORDER — DEXAMETHASONE 4 MG PO TABS: 4.0000 mg | ORAL_TABLET | Freq: Four times a day (QID) | ORAL | Status: DC

## 2013-12-25 ENCOUNTER — Ambulatory Visit (HOSPITAL_BASED_OUTPATIENT_CLINIC_OR_DEPARTMENT_OTHER): Payer: Medicare Other

## 2013-12-25 ENCOUNTER — Other Ambulatory Visit (HOSPITAL_BASED_OUTPATIENT_CLINIC_OR_DEPARTMENT_OTHER): Payer: Medicare Other

## 2013-12-25 ENCOUNTER — Encounter: Payer: Self-pay | Admitting: Oncology

## 2013-12-25 VITALS — BP 131/80 | HR 72 | Temp 97.0°F | Resp 18

## 2013-12-25 DIAGNOSIS — C712 Malignant neoplasm of temporal lobe: Secondary | ICD-10-CM

## 2013-12-25 DIAGNOSIS — C713 Malignant neoplasm of parietal lobe: Secondary | ICD-10-CM

## 2013-12-25 DIAGNOSIS — C711 Malignant neoplasm of frontal lobe: Secondary | ICD-10-CM

## 2013-12-25 DIAGNOSIS — Z5112 Encounter for antineoplastic immunotherapy: Secondary | ICD-10-CM

## 2013-12-25 DIAGNOSIS — C719 Malignant neoplasm of brain, unspecified: Secondary | ICD-10-CM

## 2013-12-25 LAB — CBC WITH DIFFERENTIAL/PLATELET
BASO%: 0.1 % (ref 0.0–2.0)
Basophils Absolute: 0 10*3/uL (ref 0.0–0.1)
EOS%: 0 % (ref 0.0–7.0)
Eosinophils Absolute: 0 10*3/uL (ref 0.0–0.5)
HCT: 37 % (ref 34.8–46.6)
HEMOGLOBIN: 12.4 g/dL (ref 11.6–15.9)
LYMPH#: 0.6 10*3/uL — AB (ref 0.9–3.3)
LYMPH%: 6 % — ABNORMAL LOW (ref 14.0–49.7)
MCH: 30 pg (ref 25.1–34.0)
MCHC: 33.5 g/dL (ref 31.5–36.0)
MCV: 89.4 fL (ref 79.5–101.0)
MONO#: 0.6 10*3/uL (ref 0.1–0.9)
MONO%: 5.7 % (ref 0.0–14.0)
NEUT#: 9 10*3/uL — ABNORMAL HIGH (ref 1.5–6.5)
NEUT%: 88.2 % — AB (ref 38.4–76.8)
Platelets: 303 10*3/uL (ref 145–400)
RBC: 4.14 10*6/uL (ref 3.70–5.45)
RDW: 15.3 % — ABNORMAL HIGH (ref 11.2–14.5)
WBC: 10.2 10*3/uL (ref 3.9–10.3)
nRBC: 0 % (ref 0–0)

## 2013-12-25 LAB — UA PROTEIN, DIPSTICK - CHCC: PROTEIN: 100 mg/dL

## 2013-12-25 MED ORDER — SODIUM CHLORIDE 0.9 % IV SOLN
10.0000 mg/kg | Freq: Once | INTRAVENOUS | Status: AC
  Administered 2013-12-25: 600 mg via INTRAVENOUS
  Filled 2013-12-25: qty 24

## 2013-12-25 MED ORDER — SODIUM CHLORIDE 0.9 % IV SOLN
Freq: Once | INTRAVENOUS | Status: AC
  Administered 2013-12-25: 12:00:00 via INTRAVENOUS

## 2013-12-25 NOTE — Patient Instructions (Signed)
Bevacizumab injection  What is this medicine?  BEVACIZUMAB (be va SIZ yoo mab) is a chemotherapy drug. It targets a protein found in many cancer cell types, and halts cancer growth. This drug treats many cancers including non-small cell lung cancer, and colon or rectal cancer. It is usually given with other chemotherapy drugs.  This medicine may be used for other purposes; ask your health care provider or pharmacist if you have questions.  COMMON BRAND NAME(S): Avastin  What should I tell my health care provider before I take this medicine?  They need to know if you have any of these conditions:  -blood clots  -heart disease, including heart failure, heart attack, or chest pain (angina)  -high blood pressure  -infection (especially a virus infection such as chickenpox, cold sores, or herpes)  -kidney disease  -lung disease  -prior chemotherapy with doxorubicin, daunorubicin, epirubicin, or other anthracycline type chemotherapy agents  -recent or ongoing radiation therapy  -recent surgery  -stroke  -an unusual or allergic reaction to bevacizumab, hamster proteins, mouse proteins, other medicines, foods, dyes, or preservatives  -pregnant or trying to get pregnant  -breast-feeding  How should I use this medicine?  This medicine is for infusion into a vein. It is given by a health care professional in a hospital or clinic setting.  Talk to your pediatrician regarding the use of this medicine in children. Special care may be needed.  Overdosage: If you think you have taken too much of this medicine contact a poison control center or emergency room at once.  NOTE: This medicine is only for you. Do not share this medicine with others.  What if I miss a dose?  It is important not to miss your dose. Call your doctor or health care professional if you are unable to keep an appointment.  What may interact with this medicine?  Interactions are not expected.  This list may not describe all possible interactions. Give your health  care provider a list of all the medicines, herbs, non-prescription drugs, or dietary supplements you use. Also tell them if you smoke, drink alcohol, or use illegal drugs. Some items may interact with your medicine.  What should I watch for while using this medicine?  Your condition will be monitored carefully while you are receiving this medicine. You will need important blood work and urine testing done while you are taking this medicine.  During your treatment, let your health care professional know if you have any unusual symptoms, such as difficulty breathing.  This medicine may rarely cause 'gastrointestinal perforation' (holes in the stomach, intestines or colon), a serious side effect requiring surgery to repair.  This medicine should be started at least 28 days following major surgery and the site of the surgery should be totally healed. Check with your doctor before scheduling dental work or surgery while you are receiving this treatment. Talk to your doctor if you have recently had surgery or if you have a wound that has not healed.  Do not become pregnant while taking this medicine. Women should inform their doctor if they wish to become pregnant or think they might be pregnant. There is a potential for serious side effects to an unborn child. Talk to your health care professional or pharmacist for more information. Do not breast-feed an infant while taking this medicine.  This medicine has caused ovarian failure in some women. This medicine may interfere with the ability to have a child. You should talk to your doctor or health   care professional if you are concerned about your fertility.  What side effects may I notice from receiving this medicine?  Side effects that you should report to your doctor or health care professional as soon as possible:  -allergic reactions like skin rash, itching or hives, swelling of the face, lips, or tongue  -signs of infection - fever or chills, cough, sore throat, pain  or trouble passing urine  -signs of decreased platelets or bleeding - bruising, pinpoint red spots on the skin, black, tarry stools, nosebleeds, blood in the urine  -breathing problems  -changes in vision  -chest pain  -confusion  -jaw pain, especially after dental work  -mouth sores  -seizures  -severe abdominal pain  -severe headache  -sudden numbness or weakness of the face, arm or leg  -swelling of legs or ankles  -symptoms of a stroke: change in mental awareness, inability to talk or move one side of the body (especially in patients with lung cancer)  -trouble passing urine or change in the amount of urine  -trouble speaking or understanding  -trouble walking, dizziness, loss of balance or coordination  Side effects that usually do not require medical attention (report to your doctor or health care professional if they continue or are bothersome):  -constipation  -diarrhea  -dry skin  -headache  -loss of appetite  -nausea, vomiting  This list may not describe all possible side effects. Call your doctor for medical advice about side effects. You may report side effects to FDA at 1-800-FDA-1088.  Where should I keep my medicine?  This drug is given in a hospital or clinic and will not be stored at home.  NOTE: This sheet is a summary. It may not cover all possible information. If you have questions about this medicine, talk to your doctor, pharmacist, or health care provider.   2014, [employer]vier/[employer] Standard. (2010-10-03 16:25:37)

## 2013-12-25 NOTE — Progress Notes (Signed)
Put husband's fmla form on nurse's desk. °

## 2013-12-27 ENCOUNTER — Encounter: Payer: Self-pay | Admitting: Oncology

## 2013-12-27 NOTE — Progress Notes (Signed)
Faxed husband's fmla form to [facility] @ [phone]

## 2014-01-08 ENCOUNTER — Telehealth: Payer: Self-pay | Admitting: Oncology

## 2014-01-08 ENCOUNTER — Ambulatory Visit (HOSPITAL_BASED_OUTPATIENT_CLINIC_OR_DEPARTMENT_OTHER): Payer: Medicare Other | Admitting: Oncology

## 2014-01-08 ENCOUNTER — Telehealth: Payer: Self-pay | Admitting: *Deleted

## 2014-01-08 ENCOUNTER — Other Ambulatory Visit: Payer: Self-pay | Admitting: Oncology

## 2014-01-08 ENCOUNTER — Ambulatory Visit (HOSPITAL_BASED_OUTPATIENT_CLINIC_OR_DEPARTMENT_OTHER): Payer: Medicare Other

## 2014-01-08 ENCOUNTER — Other Ambulatory Visit (HOSPITAL_BASED_OUTPATIENT_CLINIC_OR_DEPARTMENT_OTHER): Payer: Medicare Other

## 2014-01-08 VITALS — BP 141/70 | HR 82 | Temp 97.6°F | Resp 20 | Ht 63.0 in | Wt 140.3 lb

## 2014-01-08 DIAGNOSIS — C712 Malignant neoplasm of temporal lobe: Secondary | ICD-10-CM

## 2014-01-08 DIAGNOSIS — D72819 Decreased white blood cell count, unspecified: Secondary | ICD-10-CM

## 2014-01-08 DIAGNOSIS — D696 Thrombocytopenia, unspecified: Secondary | ICD-10-CM

## 2014-01-08 DIAGNOSIS — T451X5A Adverse effect of antineoplastic and immunosuppressive drugs, initial encounter: Secondary | ICD-10-CM

## 2014-01-08 DIAGNOSIS — C719 Malignant neoplasm of brain, unspecified: Secondary | ICD-10-CM

## 2014-01-08 DIAGNOSIS — E039 Hypothyroidism, unspecified: Secondary | ICD-10-CM

## 2014-01-08 DIAGNOSIS — D6959 Other secondary thrombocytopenia: Secondary | ICD-10-CM

## 2014-01-08 DIAGNOSIS — Z5112 Encounter for antineoplastic immunotherapy: Secondary | ICD-10-CM

## 2014-01-08 LAB — COMPREHENSIVE METABOLIC PANEL (CC13)
ALBUMIN: 2 g/dL — AB (ref 3.5–5.0)
ALK PHOS: 65 U/L (ref 40–150)
ALT: 40 U/L (ref 0–55)
AST: 17 U/L (ref 5–34)
Anion Gap: 10 mEq/L (ref 3–11)
BUN: 26.8 mg/dL — ABNORMAL HIGH (ref 7.0–26.0)
CALCIUM: 8 mg/dL — AB (ref 8.4–10.4)
CO2: 21 mEq/L — ABNORMAL LOW (ref 22–29)
Chloride: 105 mEq/L (ref 98–109)
Creatinine: 0.5 mg/dL — ABNORMAL LOW (ref 0.6–1.1)
Glucose: 168 mg/dl — ABNORMAL HIGH (ref 70–140)
POTASSIUM: 4.3 meq/L (ref 3.5–5.1)
Sodium: 136 mEq/L (ref 136–145)
Total Bilirubin: 0.31 mg/dL (ref 0.20–1.20)
Total Protein: 5 g/dL — ABNORMAL LOW (ref 6.4–8.3)

## 2014-01-08 LAB — CBC WITH DIFFERENTIAL/PLATELET
BASO%: 0 % (ref 0.0–2.0)
Basophils Absolute: 0 10*3/uL (ref 0.0–0.1)
EOS%: 0 % (ref 0.0–7.0)
Eosinophils Absolute: 0 10*3/uL (ref 0.0–0.5)
HCT: 36.4 % (ref 34.8–46.6)
HGB: 12.5 g/dL (ref 11.6–15.9)
LYMPH%: 10.3 % — ABNORMAL LOW (ref 14.0–49.7)
MCH: 30.6 pg (ref 25.1–34.0)
MCHC: 34.3 g/dL (ref 31.5–36.0)
MCV: 89.2 fL (ref 79.5–101.0)
MONO#: 0.1 10*3/uL (ref 0.1–0.9)
MONO%: 3.2 % (ref 0.0–14.0)
NEUT%: 86.5 % — ABNORMAL HIGH (ref 38.4–76.8)
NEUTROS ABS: 2.2 10*3/uL (ref 1.5–6.5)
NRBC: 0 % (ref 0–0)
RBC: 4.08 10*6/uL (ref 3.70–5.45)
RDW: 16.2 % — AB (ref 11.2–14.5)
WBC: 2.5 10*3/uL — AB (ref 3.9–10.3)
lymph#: 0.3 10*3/uL — ABNORMAL LOW (ref 0.9–3.3)

## 2014-01-08 LAB — TECHNOLOGIST REVIEW

## 2014-01-08 LAB — UA PROTEIN, DIPSTICK - CHCC: PROTEIN: 300 mg/dL

## 2014-01-08 MED ORDER — BEVACIZUMAB CHEMO INJECTION 400 MG/16ML
5.0000 mg/kg | Freq: Once | INTRAVENOUS | Status: AC
  Administered 2014-01-08: 300 mg via INTRAVENOUS
  Filled 2014-01-08: qty 12

## 2014-01-08 MED ORDER — SODIUM CHLORIDE 0.9 % IV SOLN
Freq: Once | INTRAVENOUS | Status: AC
  Administered 2014-01-08: 16:00:00 via INTRAVENOUS

## 2014-01-08 NOTE — Telephone Encounter (Signed)
Appt calendar for March was printed by nurse @ chemo room today for lab,md and chemo

## 2014-01-08 NOTE — Telephone Encounter (Signed)
GAve pt appt for lab,md and chemo for March 2015

## 2014-01-08 NOTE — Telephone Encounter (Signed)
Gave pt appt for lab and Md , emailed michelle regarding chemo for March

## 2014-01-08 NOTE — Telephone Encounter (Signed)
Per staff message and POF I have scheduled appts.  JMW  

## 2014-01-08 NOTE — Progress Notes (Signed)
Hematology and Oncology Follow Up Visit  Mercedes Barnes [id number] [birth year] 53 y.o. 01/08/2014 8:14 PM   Principle Diagnosis: Encounter Diagnoses  Name Primary?  . Glioblastoma multiforme of temporal lobe Yes  . 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 Initial treatment with Temodar and radiation. First progression December 2010. Reinitiation of Temodar and addition of Avastin. Second progression October 2013. Treated with Temodar, Camptosar, and Avastin and achieved a nice stabilization lasting for a remarkable 12 months. Camptosar discontinued due to progressive fatigue in November 2014. Oral low dose Temodar and every 2 weekly Avastin continued. At time of her visit here on 11/17/13, she was starting to have more problems with concentration, coordination of her left upper extremity, and motor control of her left hand.. Chronic left Visual field cut worsening. MRI of the brain done at [facility] on January 22 confirmed suspicion of progressive disease. Recommendation was oral CCNU 82.5 mg per meter squared every 6 weeks and continue Avastin. A few days after her visit at [facility], husband e-mailed me that she was doing poorly. She developed inability to move her left upper extremity, was having increasing problems with balance and was falling. I told him to start her on Decadron 4 mg by mouth 4 times a day. She had some transient improvement in symptoms of urinary urgency when she went back on steroids but no improvement in other areas. She likely had a episode of seizure activity when she suddenly became rigid, fell down, and had transient garbled speech.  After just one dose of the CCNU at a 20% reduction,  platelet count today is 34,000 and total white count 2500, ANC 2.2.  Hemoglobin stable at 12.5 g.    Medications: reviewed  Allergies:  Allergies  Allergen Reactions  . Co-Trimoxazole Injection [Sulfamethoxazole-Trimethoprim] Hives  . Pentamidine     Hypotension & weakness    Review of Systems: Hematology:  No bleeding or bruising ENT ROS: No sore throat Breast ROS:  Respiratory ROS: No cough or dyspnea Cardiovascular ROS:  No chest pain or palpitations Gastrointestinal ROS:   No abdominal pain Genito-Urinary ROS: See above Musculoskeletal ROS: No bone pain Neurological ROS: See above Dermatological ROS: No rash Remaining ROS negative:   Physical Exam: Blood pressure 141/70, pulse 82, temperature 97.6 F (36.4 C), temperature source Oral, resp. rate 20, height 5\' 3"  (1.6 m), weight 140 lb 4.8 oz (63.64 kg). Wt Readings from Last 3 Encounters:  01/08/14 140 lb 4.8 oz (63.64 kg)  11/27/13 128 lb 4.8 oz (58.196 kg)  10/30/13 129 lb 9.6 oz (58.786 kg)     General appearance: She appears chronically ill with loss of muscle mass of her face. [name]: Pharynx no erythema, exudate, mass, or ulcer. No thyromegaly or thyroid nodules Lymph nodes: No cervical, supraclavicular, or axillary lymphadenopathy Breasts:  Lungs: Clear to auscultation, resonant to percussion throughout Heart: Regular rhythm, no murmur, no gallop, no rub, no click, no edema Abdomen: Soft, nontender, normal bowel sounds, no mass, no organomegaly Extremities: Significant edema dorsum left hand,  Musculoskeletal: no joint deformities GU:  Vascular: Carotid pulses 2+, no bruits, Neurologic: Alert, oriented, PERRLA, mildly disconjugate gaze, optic disc sharp left eye and vessels normal, no hemorrhage or exudate, right fundus not well visualized. cranial nerves with Left facial weakness, left lateral visual field cut,  motor strength with new left upper extremity plegia. trace weakness proximal muscles left lower extremity. Reflexes 2+ symmetric. Unable to test gait. Husband states  she is not ataxic and has left leg weakness. Skin: No rash or ecchymosis  Lab Results: CBC W/Diff    Component Value Date/Time   WBC 2.5* 01/08/2014 1116   WBC 10.3 06/02/2009 0530   WBC 9.5 03/20/2009 1454   RBC 4.08 01/08/2014 1116   RBC 3.57* 06/02/2009 0530   HGB 12.5 01/08/2014 1116   HGB 10.7* 06/02/2009 0530   HGB 11.6 03/20/2009 1454   HCT 36.4 01/08/2014 1116   HCT 31.7* 06/02/2009 0530   HCT 33.9* 03/20/2009 1454   PLT 28 Platelet count confirmed by slide estimate* 01/08/2014 1116   PLT 263 06/02/2009 0530   PLT 355 03/20/2009 1454   MCV 89.2 01/08/2014 1116   MCV 88.7 06/02/2009 0530   MCV 82 03/20/2009 1454   MCH 30.6 01/08/2014 1116   MCH 31.0 12/01/2010 1300   MCH 28.1 03/20/2009 1454   MCHC 34.3 01/08/2014 1116   MCHC 33.8 06/02/2009 0530   MCHC 34.1 03/20/2009 1454   RDW 16.2* 01/08/2014 1116   RDW 22.3* 06/02/2009 0530   RDW 14.9* 03/20/2009 1454   LYMPHSABS 0.3* 01/08/2014 1116   LYMPHSABS 1.4 06/01/2009 0409   LYMPHSABS 1.3 03/20/2009 1454   MONOABS 0.1 01/08/2014 1116   MONOABS 0.5 06/01/2009 0409   EOSABS 0.0 01/08/2014 1116   EOSABS 0.0 06/01/2009 0409   EOSABS 0.1 03/20/2009 1454   BASOSABS 0.0 01/08/2014 1116   BASOSABS 0.0 06/01/2009 0409   BASOSABS 0.1 03/20/2009 1454     Chemistry      Component Value Date/Time   NA 136 01/08/2014 1116   NA 134* 02/06/2013 1632   NA 138 03/20/2009 1454   K 4.3 01/08/2014 1116   K 3.9 02/06/2013 1632   K 3.8 03/20/2009 1454   CL 107 05/01/2013 1241   CL 101 02/06/2013 1632   CL 100 03/20/2009 1454   CO2 21* 01/08/2014 1116   CO2 24 02/06/2013 1632   CO2 27 03/20/2009 1454   BUN 26.8* 01/08/2014 1116   BUN 15 02/06/2013 1632   BUN 18 03/20/2009 1454   CREATININE 0.5* 01/08/2014 1116   CREATININE 0.48* 02/06/2013 1632   CREATININE 0.51 11/28/2012 1401   CREATININE 0.6 03/20/2009 1454      Component Value Date/Time   CALCIUM 8.0* 01/08/2014 1116   CALCIUM 9.7 02/06/2013 1632   CALCIUM 8.6 03/20/2009 1454   ALKPHOS 65 01/08/2014 1116   ALKPHOS 53 02/06/2013 1632    ALKPHOS 65 03/20/2009 1454   AST 17 01/08/2014 1116   AST 17 02/06/2013 1632   AST 14 03/20/2009 1454   ALT 40 01/08/2014 1116   ALT 15 02/06/2013 1632   ALT 21 03/20/2009 1454   BILITOT 0.31 01/08/2014 1116   BILITOT 0.1* 02/06/2013 1632   BILITOT 0.50 03/20/2009 1454       Impression:  #1. Third progression of glioblastoma Now with rapid neurologic deterioration despite initiation of CCNU and steroids. She is now unable to ambulate without maximum assistance. Her left upper extremity is plegic. Left lateral visual field cut this worse. Husband who always accompanies her for her visits is well aware of the significance of her deterioration and the prospects that her condition will not likely stabilize again. We had discussed hospice by phone. I made preliminary inquiries but as long as she was on the chemotherapy/Avastin she could not  officially enroll in hospice. At this point I don't think we are going to see anything but additional toxicity by giving her more chemotherapy. I proposed that we stop the CCNU and taper her off the Avastin. I will drop her dose to 5 mg per kilograms today. She is going to be seen at [facility] later this week.  She is still quite mentally alert. We discussed hospice care today. Her response was this is very final and she is not ready for hospice yet. I told her just to take some time and think about it. The main reason to get hospice involved would be to support her and her family as her nursing needs increase. I also feel that the patient and her children need some emotional support.  Although it was inevitable that this would happen, it is still sad to see this very brave woman deteriorate.  #2. Significant leukopenia and thrombocytopenia following one dose of oral CCNU. Given rapid neurologic deterioration, I do not think that we should give additional chemotherapy.   CC: Patient Care Team: Abigail Miyamoto, MD as PCP - General (Family Medicine) Aldine Contes as  Referring Physician (Neurology) Blair Promise, MD as Consulting Physician (Radiation Oncology) Annia Belt, MD as Consulting Physician (Oncology)   Annia Belt, MD 2/23/20158:14 PM

## 2014-01-08 NOTE — Patient Instructions (Signed)
[facility] Discharge Instructions for Patients Receiving Chemotherapy  Today you received the following chemotherapy agents Avastin.  To help prevent nausea and vomiting after your treatment, we encourage you to take your nausea medication as prescribed.   If you develop nausea and vomiting that is not controlled by your nausea medication, call the clinic.   BELOW ARE SYMPTOMS THAT SHOULD BE REPORTED IMMEDIATELY:  *FEVER GREATER THAN 100.5 F  *CHILLS WITH OR WITHOUT FEVER  NAUSEA AND VOMITING THAT IS NOT CONTROLLED WITH YOUR NAUSEA MEDICATION  *UNUSUAL SHORTNESS OF BREATH  *UNUSUAL BRUISING OR BLEEDING  TENDERNESS IN MOUTH AND THROAT WITH OR WITHOUT PRESENCE OF ULCERS  *URINARY PROBLEMS  *BOWEL PROBLEMS  UNUSUAL RASH Items with * indicate a potential emergency and should be followed up as soon as possible.  Feel free to call the clinic you have any questions or concerns. The clinic phone number is [phone].    

## 2014-01-10 ENCOUNTER — Telehealth: Payer: Self-pay | Admitting: Oncology

## 2014-01-10 ENCOUNTER — Other Ambulatory Visit: Payer: Self-pay | Admitting: Oncology

## 2014-01-10 DIAGNOSIS — D61810 Antineoplastic chemotherapy induced pancytopenia: Secondary | ICD-10-CM

## 2014-01-10 DIAGNOSIS — C712 Malignant neoplasm of temporal lobe: Secondary | ICD-10-CM

## 2014-01-10 NOTE — Telephone Encounter (Signed)
S/w the pt's husband and he is aware of the lab appt on 01/12/2014 @1 :30pm

## 2014-01-12 ENCOUNTER — Ambulatory Visit (HOSPITAL_BASED_OUTPATIENT_CLINIC_OR_DEPARTMENT_OTHER): Payer: Medicare Other

## 2014-01-12 ENCOUNTER — Other Ambulatory Visit (HOSPITAL_BASED_OUTPATIENT_CLINIC_OR_DEPARTMENT_OTHER): Payer: Medicare Other

## 2014-01-12 ENCOUNTER — Ambulatory Visit (HOSPITAL_BASED_OUTPATIENT_CLINIC_OR_DEPARTMENT_OTHER): Payer: Medicare Other | Admitting: Oncology

## 2014-01-12 ENCOUNTER — Ambulatory Visit (HOSPITAL_COMMUNITY)
Admission: RE | Admit: 2014-01-12 | Discharge: 2014-01-12 | Disposition: A | Discharge status: Home / Self Care | Payer: Medicare Other | Source: Ambulatory Visit | Attending: Oncology | Admitting: Oncology

## 2014-01-12 ENCOUNTER — Encounter: Payer: Self-pay | Admitting: Oncology

## 2014-01-12 ENCOUNTER — Encounter (INDEPENDENT_AMBULATORY_CARE_PROVIDER_SITE_OTHER): Payer: Self-pay

## 2014-01-12 VITALS — BP 130/89 | HR 102 | Temp 98.2°F | Resp 17 | Ht 63.0 in

## 2014-01-12 VITALS — BP 139/78 | HR 84 | Temp 96.9°F | Resp 18

## 2014-01-12 DIAGNOSIS — G8190 Hemiplegia, unspecified affecting unspecified side: Secondary | ICD-10-CM

## 2014-01-12 DIAGNOSIS — R040 Epistaxis: Secondary | ICD-10-CM

## 2014-01-12 DIAGNOSIS — C713 Malignant neoplasm of parietal lobe: Secondary | ICD-10-CM

## 2014-01-12 DIAGNOSIS — C712 Malignant neoplasm of temporal lobe: Secondary | ICD-10-CM

## 2014-01-12 DIAGNOSIS — C711 Malignant neoplasm of frontal lobe: Secondary | ICD-10-CM

## 2014-01-12 DIAGNOSIS — D61810 Antineoplastic chemotherapy induced pancytopenia: Secondary | ICD-10-CM

## 2014-01-12 DIAGNOSIS — D61811 Other drug-induced pancytopenia: Secondary | ICD-10-CM

## 2014-01-12 DIAGNOSIS — M7989 Other specified soft tissue disorders: Secondary | ICD-10-CM

## 2014-01-12 DIAGNOSIS — D696 Thrombocytopenia, unspecified: Secondary | ICD-10-CM

## 2014-01-12 DIAGNOSIS — T451X5A Adverse effect of antineoplastic and immunosuppressive drugs, initial encounter: Secondary | ICD-10-CM | POA: Insufficient documentation

## 2014-01-12 DIAGNOSIS — D709 Neutropenia, unspecified: Secondary | ICD-10-CM

## 2014-01-12 DIAGNOSIS — D6959 Other secondary thrombocytopenia: Secondary | ICD-10-CM | POA: Insufficient documentation

## 2014-01-12 DIAGNOSIS — J340 Abscess, furuncle and carbuncle of nose: Secondary | ICD-10-CM

## 2014-01-12 DIAGNOSIS — R609 Edema, unspecified: Secondary | ICD-10-CM

## 2014-01-12 DIAGNOSIS — I10 Essential (primary) hypertension: Secondary | ICD-10-CM

## 2014-01-12 DIAGNOSIS — I998 Other disorder of circulatory system: Secondary | ICD-10-CM

## 2014-01-12 DIAGNOSIS — J3489 Other specified disorders of nose and nasal sinuses: Secondary | ICD-10-CM

## 2014-01-12 HISTORY — DX: Other disorder of circulatory system: I99.8

## 2014-01-12 HISTORY — DX: Abscess, furuncle and carbuncle of nose: J34.0

## 2014-01-12 HISTORY — DX: Other secondary thrombocytopenia: T45.1X5A

## 2014-01-12 HISTORY — DX: Antineoplastic chemotherapy induced pancytopenia: D61.810

## 2014-01-12 HISTORY — DX: Epistaxis: R04.0

## 2014-01-12 HISTORY — DX: Other secondary thrombocytopenia: D69.59

## 2014-01-12 LAB — CBC WITH DIFFERENTIAL/PLATELET
BASO%: 0 % (ref 0.0–2.0)
BASOS ABS: 0 10*3/uL (ref 0.0–0.1)
EOS ABS: 0 10*3/uL (ref 0.0–0.5)
EOS%: 0 % (ref 0.0–7.0)
HCT: 33.2 % — ABNORMAL LOW (ref 34.8–46.6)
HGB: 11.4 g/dL — ABNORMAL LOW (ref 11.6–15.9)
LYMPH%: 30 % (ref 14.0–49.7)
MCH: 30.2 pg (ref 25.1–34.0)
MCHC: 34.3 g/dL (ref 31.5–36.0)
MCV: 88.1 fL (ref 79.5–101.0)
MONO#: 0 10*3/uL — ABNORMAL LOW (ref 0.1–0.9)
MONO%: 8 % (ref 0.0–14.0)
NEUT%: 62 % (ref 38.4–76.8)
NEUTROS ABS: 0.3 10*3/uL — AB (ref 1.5–6.5)
PLATELETS: 11 10*3/uL — AB (ref 145–400)
RBC: 3.77 10*6/uL (ref 3.70–5.45)
RDW: 17.1 % — ABNORMAL HIGH (ref 11.2–14.5)
WBC: 0.5 10*3/uL — CL (ref 3.9–10.3)
lymph#: 0.2 10*3/uL — ABNORMAL LOW (ref 0.9–3.3)
nRBC: 0 % (ref 0–0)

## 2014-01-12 LAB — ABO/RH: ABO/RH(D): A POS

## 2014-01-12 MED ORDER — ACETAMINOPHEN 325 MG PO TABS
ORAL_TABLET | ORAL | Status: AC
  Filled 2014-01-12: qty 2

## 2014-01-12 MED ORDER — CIPROFLOXACIN HCL 500 MG PO TABS: 500.0000 mg | ORAL_TABLET | Freq: Two times a day (BID) | ORAL | Status: DC

## 2014-01-12 MED ORDER — ACETAMINOPHEN 325 MG PO TABS
650.0000 mg | ORAL_TABLET | Freq: Once | ORAL | Status: AC
  Administered 2014-01-12: 650 mg via ORAL

## 2014-01-12 MED ORDER — CEPHALEXIN 500 MG PO CAPS: 500.0000 mg | ORAL_CAPSULE | Freq: Three times a day (TID) | ORAL | Status: DC

## 2014-01-12 MED ORDER — SODIUM CHLORIDE 0.9 % IV SOLN
250.0000 mL | Freq: Once | INTRAVENOUS | Status: AC
  Administered 2014-01-12: 250 mL via INTRAVENOUS

## 2014-01-12 NOTE — Progress Notes (Signed)
Hematology and Oncology Follow Up Visit  Mercedes Barnes [id number] [birth year] 53 y.o. 01/12/2014 5:51 PM   Principle Diagnosis: Encounter Diagnoses  Name Primary?  . Chemotherapy induced thrombocytopenia   . Glioblastoma multiforme of temporal lobe Yes     Interim History:   Unscheduled visit for this 53 year old woman with relapsed glioblastoma brain cancer who I just saw this Monday on February 23. Since recent progression she has declined rapidly. She has developed a left hemiplegia. Progressive visual loss. 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 To complicate matters, she is at the nadir from a dose of oral CCNU. White count on Monday was 2500 with platelet count 28,000. She was scheduled to come back again today to repeat lab. Husband called this morning. She has developed epistaxis and the bleeding will not stop. She has developed pain and discoloration of the fingertips of 3 fingers on her right hand. She has developed erythema and pain on the soft tissues of her nose. No fevers. She has developed abdominal swelling and peripheral edema.   Medications: reviewed  Allergies:  Allergies  Allergen Reactions  . Barnes-Trimoxazole Injection [Sulfamethoxazole-Trimethoprim] Hives  . Pentamidine     Hypotension & weakness    Review of Systems: Hematology:  See above ENT ROS: No sore throat. See above Breast ROS:  Respiratory ROS: No cough or dyspnea Cardiovascular ROS: No chest pain or palpitations  Gastrointestinal ROS:  See above  Genito-Urinary ROS: No urinary tract symptoms Musculoskeletal ROS: No muscle or bone pain Neurological ROS: Now bedridden. Dermatological ROS: See above Remaining ROS negative:   Physical Exam: Blood pressure 130/89, pulse 102, temperature 98.2 F (36.8 C), temperature source Oral, resp. rate 17, height 5\' 3"  (1.6 m), weight 0 lb (0 kg), SpO2 98.00%. Wt Readings from Last 3 Encounters:  01/08/14 140 lb 4.8  oz (63.64 kg)  11/27/13 128 lb 4.8 oz (58.196 kg)  10/30/13 129 lb 9.6 oz (58.786 kg)     General appearance: Frail Caucasian woman [name]: Pharynx no erythema, exudate, mass, or ulcer. Blood oozing from the nose. Soft tissues around the tip of the nose erythematous and tender to palpation No thyromegaly or thyroid nodules Lymph nodes: No cervical, supraclavicular, or axillary lymphadenopathy Breasts Lungs: Clear to auscultation, resonant to percussion throughout Heart: Regular rhythm, no murmur, no gallop, no rub, no click, no edema Abdomen: Moderately distended, Soft, nontender, normal bowel sounds, no mass, no organomegaly Extremities: Now with 2+ leg edema as well as edema of the dorsum of the left hand  no calf tenderness. Ischemic changes at the very distal tips of the fingertips of 3 fingers on her right hand adjacent to the nailbed. Musculoskeletal: no joint deformities GU:  Vascular: Carotid pulses 2+, no bruits, distal pulses: Strong radial and ulnar pulses right wrist Neurologic: She is alert and oriented. Left hemiplegia. Increasing disconjugate gaze. Right pupil 2 mm smaller than left both reactive. Left facial droop. Reflexes 2+ symmetric.  Lab Results: CBC W/Diff    Component Value Date/Time   WBC 0.5* 01/12/2014 0957   WBC 10.3 06/02/2009 0530   WBC 9.5 03/20/2009 1454   RBC 3.77 01/12/2014 0957   RBC 3.57* 06/02/2009 0530   HGB 11.4* 01/12/2014 0957   HGB 10.7* 06/02/2009 0530   HGB 11.6 03/20/2009 1454   HCT 33.2* 01/12/2014 0957   HCT 31.7* 06/02/2009 0530   HCT 33.9* 03/20/2009 1454   PLT 11* 01/12/2014 0957   PLT 263 06/02/2009 0530  PLT 355 03/20/2009 1454   MCV 88.1 01/12/2014 0957   MCV 88.7 06/02/2009 0530   MCV 82 03/20/2009 1454   MCH 30.2 01/12/2014 0957   MCH 31.0 12/01/2010 1300   MCH 28.1 03/20/2009 1454   MCHC 34.3 01/12/2014 0957   MCHC 33.8 06/02/2009 0530   MCHC 34.1 03/20/2009 1454   RDW 17.1* 01/12/2014 0957   RDW 22.3* 06/02/2009 0530   RDW 14.9* 03/20/2009 1454    LYMPHSABS 0.2* 01/12/2014 0957   LYMPHSABS 1.4 06/01/2009 0409   LYMPHSABS 1.3 03/20/2009 1454   MONOABS 0.0* 01/12/2014 0957   MONOABS 0.5 06/01/2009 0409   EOSABS 0.0 01/12/2014 0957   EOSABS 0.0 06/01/2009 0409   EOSABS 0.1 03/20/2009 1454   BASOSABS 0.0 01/12/2014 0957   BASOSABS 0.0 06/01/2009 0409   BASOSABS 0.1 03/20/2009 1454     Chemistry      Component Value Date/Time   NA 136 01/08/2014 1116   NA 134* 02/06/2013 1632   NA 138 03/20/2009 1454   K 4.3 01/08/2014 1116   K 3.9 02/06/2013 1632   K 3.8 03/20/2009 1454   CL 107 05/01/2013 1241   CL 101 02/06/2013 1632   CL 100 03/20/2009 1454   CO2 21* 01/08/2014 1116   CO2 24 02/06/2013 1632   CO2 27 03/20/2009 1454   BUN 26.8* 01/08/2014 1116   BUN 15 02/06/2013 1632   BUN 18 03/20/2009 1454   CREATININE 0.5* 01/08/2014 1116   CREATININE 0.48* 02/06/2013 1632   CREATININE 0.51 11/28/2012 1401   CREATININE 0.6 03/20/2009 1454      Component Value Date/Time   CALCIUM 8.0* 01/08/2014 1116   CALCIUM 9.7 02/06/2013 1632   CALCIUM 8.6 03/20/2009 1454   ALKPHOS 65 01/08/2014 1116   ALKPHOS 53 02/06/2013 1632   ALKPHOS 65 03/20/2009 1454   AST 17 01/08/2014 1116   AST 17 02/06/2013 1632   AST 14 03/20/2009 1454   ALT 40 01/08/2014 1116   ALT 15 02/06/2013 1632   ALT 21 03/20/2009 1454   BILITOT 0.31 01/08/2014 1116   BILITOT 0.1* 02/06/2013 1632   BILITOT 0.50 03/20/2009 1454        Impression:  #1. Rapidly progressive neurologic deterioration secondary to known brain cancer  #2. Profound pancytopenia secondary to recent chemotherapy  #3. Epistaxis secondary to thrombocytopenia  #4. Cellulitis soft tissues of the nose  #5. Atypical localized ischemic changes distal digits right hand with normal radial and ulnar pulses  Plan: She will receive a platelet transfusion in our office today. I am starting her on Cipro to cover her neutropenia and soft tissue infection of her nose. It is not clear what the changes of her right fingertips  represent. I don't think  that they are secondary to digital infarcts. I will treat symptomatically with pain medication and patient encouraged to wear a glove to keep the hand warm. Antibiotics as above to cover vasculitis/infection  We discussed hospice intervention on Monday. Patient is now agreeable to this and I will make a hospice referral. She indicates today on questioning that she would not want her life prolonged by unnecessary or mechanical means. Husband and daughter are present for the discussion. Husband also in agreement. I will establish NO CODE STATUS in her record.   CC: Patient Care Team: Abigail Miyamoto, MD as PCP - General (Family Medicine) Aldine Contes as Referring Physician (Neurology) Blair Promise, MD as Consulting Physician (Radiation Oncology) Annia Belt, MD as Consulting Physician (  Oncology)   Annia Belt, MD 2/27/20155:51 PM

## 2014-01-12 NOTE — Progress Notes (Signed)
While in infusion room, patient voiced concerns about fingertips of R hand. 5 fingertips are reddened and tender. R hand 4th fingertip noted to have blackened area; patient has fingernail polish on. Cannot assess nailbed. Patient's husband has concerns regarding the spread of this black area. He states he believes the area has increased in size while sitting in the infusion room. Patient's husband is very concerned about this. Dr. Beryle Beams has examined the patient today in an office visit and did prescribe ABX to their pharmacy, which their daughter is picking up now. Patient instructed to take ABX as prescribed. Patient and husband informed to call clinic if patient has a fever greater than 100. Pt's husband wants to know "how much spreading is too much," referring to blackened area. This RN voiced these concerns to Dr. Beryle Beams. Per MD, keep hand warm, may keep in glove if comfortable, and take pain meds as needed but not aspirin. If current pain meds at home are insufficient, call clinic. Pt instructed to call clinic if blackened area of 4th finger spreads from fingertip down the finger pad and palm of hand. Patient instructed to notify her husband if she looses feeling in these fingertips which he will then call clinic. The patient and husband verbalize understanding of these instructions. Patient's husband also has questions about hospice, which they agreed upon at today's MD visit. Per Dr. Beryle Beams, Hospice will contact the family once the referral has been processed. Patient's husband verbalizes understanding.

## 2014-01-12 NOTE — Patient Instructions (Signed)
Platelet Transfusion Information This is information about transfusions of platelets. Platelets are tiny cells made by the bone marrow and found in the blood. When a blood vessel is damaged platelets rush to the damaged area to help form a clot. This begins the healing process. When platelets get very low your blood may have trouble clotting. This may be from:  Illness.  Blood disorder.  Chemotherapy to treat cancer. Often lower platelet counts do not usually cause problems.  Platelets usually last for 7 to 10 days. If they are not used not used in an injury, they are broken down by the liver or spleen. Symptoms of low platelet count include:  Nosebleeds.  Bleeding gums.  Heavy periods.  Bruising and tiny blood spots in the skin.  Pin point spots of bleeding are called (petechiae).  Larger bruises (purpura).  Bleeding can be more serious if it happens in the brain or bowel. Platelet transfusions are often used to keep the platelet count at an acceptable level. Serious bleeding due to low platelets is uncommon. RISKS AND COMPLICATIONS Severe side effects from platelet transfusions are uncommon. Minor reactions may include:  Itching.  Rashes.  High temperature and shivering. Medications are available to stop transfusion reactions. Let your caregivers know if you develop any of the above problems.  If you are having platelet transfusions frequently they may get less effective. This is called becoming refractory to platelets. It is uncommon. This can happen from non-immune causes and immune causes. Non-immune causes include:  High temperatures.  Some medications.  An enlarged spleen. Immune causes happen when your body discovers the platelets are not your own and begin making antibodies against them. The antibodies kill the platelets quickly. Even with platelet transfusions you may still notice problems with bleeding or bruising. Let your caregivers know about this. Other things  can be done to help if this happens.  BEFORE THE PROCEDURE   Your doctors will check your platelet count regularly.  If the platelet count is too low it may be necessary to have a platelet transfusion.  This is more important before certain procedures with a risk of bleeding such as a spinal tap.  Platelet transfusion reduces the risk of bleeding during or after the procedure.  Except in emergencies, giving a transfusion requires a written consent. Before blood is taken from a donor, a complete history is taken to make sure the person has no history of previous diseases, nor engages in risky social behavior. Examples of this are intravenous drug use or sexual activity with multiple partners. This could lead to infected blood or blood products being used. This history is done even in spite of the extensive testing to make sure the blood is safe. All blood products transfused are tested to make sure it is a match for the person getting the blood. It is also checked for infections. Blood is the safest it has ever been. The risk of getting an infection is very low. PROCEDURE  The platelets are stored in small plastic bags which are kept at a low temperature.  Each bag is called a unit and sometimes two units are given. They are given through an intravenous line by drip infusion over about one half hour.  Usually blood is collected from multiple people to get enough to transfuse.  Sometimes, the platelets are collected from a single person. This is done using a special machine that separates the platelets from the blood. The machine is called an apheresis machine. Platelets collected   in this way are called apheresed platelets. Apheresed platelets reduce the risk of becoming sensitive to the platelets. This lowers the chances of having a transfusion reaction.  As it only takes a short time to give the platelets, this treatment can be given in an outpatients department. Platelets can also be given  before or after other treatments. SEEK IMMEDIATE MEDICAL CARE IF: Any of the following symptoms over the next 12 hours or several days:  Shaking chills.  Fever with a temperature greater than 102 F (38.9 C) develops.  Back pain or muscle pain.  People around you feel you are not acting correctly, or you are confused.  Blood in the urine or bowel movements or bleeding from any place in your body.  Shortness of breath, or difficulty breathing.  Dizziness.  Fainting.  You break out in a rash or develop hives.  You have a decrease in the amount of urine you are putting out, or the urine turns a dark color or changes to pink, red, or brown.  A severe headache or stiff neck.  Bruising more easily. Document Released: 08/30/2007 Document Revised: 01/25/2012 Document Reviewed: 08/30/2007 [employer] Patient Information 2014 [employer].  

## 2014-01-13 ENCOUNTER — Encounter (HOSPITAL_COMMUNITY): Payer: Self-pay | Admitting: Emergency Medicine

## 2014-01-13 ENCOUNTER — Emergency Department (HOSPITAL_COMMUNITY): Payer: Medicare Other

## 2014-01-13 ENCOUNTER — Inpatient Hospital Stay (HOSPITAL_COMMUNITY)
Admission: EM | Admit: 2014-01-13 | Discharge: 2014-01-14 | DRG: 602 | Disposition: A | Discharge status: Hospice | Payer: Medicare Other | Attending: Internal Medicine | Admitting: Internal Medicine

## 2014-01-13 ENCOUNTER — Encounter: Payer: Self-pay | Admitting: Oncology

## 2014-01-13 DIAGNOSIS — T451X5A Adverse effect of antineoplastic and immunosuppressive drugs, initial encounter: Secondary | ICD-10-CM | POA: Diagnosis present

## 2014-01-13 DIAGNOSIS — H547 Unspecified visual loss: Secondary | ICD-10-CM | POA: Diagnosis present

## 2014-01-13 DIAGNOSIS — G90519 Complex regional pain syndrome I of unspecified upper limb: Secondary | ICD-10-CM | POA: Diagnosis present

## 2014-01-13 DIAGNOSIS — Z86711 Personal history of pulmonary embolism: Secondary | ICD-10-CM

## 2014-01-13 DIAGNOSIS — Z8049 Family history of malignant neoplasm of other genital organs: Secondary | ICD-10-CM

## 2014-01-13 DIAGNOSIS — J340 Abscess, furuncle and carbuncle of nose: Secondary | ICD-10-CM

## 2014-01-13 DIAGNOSIS — D6959 Other secondary thrombocytopenia: Secondary | ICD-10-CM

## 2014-01-13 DIAGNOSIS — I998 Other disorder of circulatory system: Secondary | ICD-10-CM | POA: Diagnosis present

## 2014-01-13 DIAGNOSIS — C719 Malignant neoplasm of brain, unspecified: Secondary | ICD-10-CM

## 2014-01-13 DIAGNOSIS — R030 Elevated blood-pressure reading, without diagnosis of hypertension: Secondary | ICD-10-CM | POA: Diagnosis present

## 2014-01-13 DIAGNOSIS — E039 Hypothyroidism, unspecified: Secondary | ICD-10-CM | POA: Diagnosis present

## 2014-01-13 DIAGNOSIS — Z79899 Other long term (current) drug therapy: Secondary | ICD-10-CM

## 2014-01-13 DIAGNOSIS — Z66 Do not resuscitate: Secondary | ICD-10-CM | POA: Diagnosis present

## 2014-01-13 DIAGNOSIS — L0201 Cutaneous abscess of face: Secondary | ICD-10-CM

## 2014-01-13 DIAGNOSIS — G8190 Hemiplegia, unspecified affecting unspecified side: Secondary | ICD-10-CM | POA: Diagnosis present

## 2014-01-13 DIAGNOSIS — D709 Neutropenia, unspecified: Secondary | ICD-10-CM

## 2014-01-13 DIAGNOSIS — R7309 Other abnormal glucose: Secondary | ICD-10-CM | POA: Diagnosis present

## 2014-01-13 DIAGNOSIS — D701 Agranulocytosis secondary to cancer chemotherapy: Secondary | ICD-10-CM

## 2014-01-13 DIAGNOSIS — C712 Malignant neoplasm of temporal lobe: Secondary | ICD-10-CM | POA: Diagnosis present

## 2014-01-13 DIAGNOSIS — R739 Hyperglycemia, unspecified: Secondary | ICD-10-CM | POA: Diagnosis present

## 2014-01-13 DIAGNOSIS — I999 Unspecified disorder of circulatory system: Secondary | ICD-10-CM

## 2014-01-13 DIAGNOSIS — Z9221 Personal history of antineoplastic chemotherapy: Secondary | ICD-10-CM

## 2014-01-13 DIAGNOSIS — I10 Essential (primary) hypertension: Secondary | ICD-10-CM | POA: Diagnosis present

## 2014-01-13 DIAGNOSIS — L03211 Cellulitis of face: Secondary | ICD-10-CM | POA: Diagnosis present

## 2014-01-13 DIAGNOSIS — D702 Other drug-induced agranulocytosis: Secondary | ICD-10-CM

## 2014-01-13 DIAGNOSIS — L03019 Cellulitis of unspecified finger: Principal | ICD-10-CM | POA: Diagnosis present

## 2014-01-13 DIAGNOSIS — D696 Thrombocytopenia, unspecified: Secondary | ICD-10-CM

## 2014-01-13 DIAGNOSIS — D61810 Antineoplastic chemotherapy induced pancytopenia: Secondary | ICD-10-CM | POA: Diagnosis present

## 2014-01-13 DIAGNOSIS — IMO0002 Reserved for concepts with insufficient information to code with codable children: Secondary | ICD-10-CM | POA: Diagnosis present

## 2014-01-13 DIAGNOSIS — Z823 Family history of stroke: Secondary | ICD-10-CM

## 2014-01-13 DIAGNOSIS — Z87891 Personal history of nicotine dependence: Secondary | ICD-10-CM

## 2014-01-13 DIAGNOSIS — R040 Epistaxis: Secondary | ICD-10-CM

## 2014-01-13 DIAGNOSIS — T380X5A Adverse effect of glucocorticoids and synthetic analogues, initial encounter: Secondary | ICD-10-CM | POA: Diagnosis present

## 2014-01-13 LAB — COMPREHENSIVE METABOLIC PANEL
ALT: 57 U/L — ABNORMAL HIGH (ref 0–35)
AST: 17 U/L (ref 0–37)
Albumin: 1.3 g/dL — ABNORMAL LOW (ref 3.5–5.2)
Alkaline Phosphatase: 61 U/L (ref 39–117)
BUN: 25 mg/dL — ABNORMAL HIGH (ref 6–23)
CO2: 21 mEq/L (ref 19–32)
Calcium: 7.9 mg/dL — ABNORMAL LOW (ref 8.4–10.5)
Chloride: 101 mEq/L (ref 96–112)
Creatinine, Ser: 0.5 mg/dL (ref 0.50–1.10)
GFR calc non Af Amer: >90 mL/min (ref >90)
Glucose, Bld: 280 mg/dL — ABNORMAL HIGH (ref 70–99)
POTASSIUM: 4.4 meq/L (ref 3.7–5.3)
Sodium: 135 mEq/L — ABNORMAL LOW (ref 137–147)
TOTAL PROTEIN: 4.9 g/dL — AB (ref 6.0–8.3)
Total Bilirubin: 0.2 mg/dL — ABNORMAL LOW (ref 0.3–1.2)

## 2014-01-13 LAB — CBC WITH DIFFERENTIAL/PLATELET
BASOS ABS: 0 10*3/uL (ref 0.0–0.1)
Basophils Relative: 0 % (ref 0–1)
EOS ABS: 0 10*3/uL (ref 0.0–0.7)
Eosinophils Relative: 0 % (ref 0–5)
HEMATOCRIT: 29.3 % — AB (ref 36.0–46.0)
Hemoglobin: 10.1 g/dL — ABNORMAL LOW (ref 12.0–15.0)
LYMPHS ABS: 0.1 10*3/uL — AB (ref 0.7–4.0)
Lymphocytes Relative: 21 % (ref 12–46)
MCH: 30.3 pg (ref 26.0–34.0)
MCHC: 34.5 g/dL (ref 30.0–36.0)
MCV: 88 fL (ref 78.0–100.0)
MONOS PCT: 3 % (ref 3–12)
Monocytes Absolute: 0 10*3/uL — ABNORMAL LOW (ref 0.1–1.0)
Neutro Abs: 0.3 10*3/uL — ABNORMAL LOW (ref 1.7–7.7)
Neutrophils Relative %: 76 % (ref 43–77)
Platelets: 21 10*3/uL — CL (ref 150–400)
RBC: 3.33 MIL/uL — ABNORMAL LOW (ref 3.87–5.11)
RDW: 17 % — AB (ref 11.5–15.5)
WBC: 0.4 10*3/uL — CL (ref 4.0–10.5)

## 2014-01-13 LAB — APTT: aPTT: 28 seconds (ref 24–37)

## 2014-01-13 LAB — PROTIME-INR
INR: 1.03 (ref 0.00–1.49)
Prothrombin Time: 13.3 seconds (ref 11.6–15.2)

## 2014-01-13 LAB — GRAM STAIN: GRAM STAIN: NONE SEEN

## 2014-01-13 MED ORDER — POLYETHYLENE GLYCOL 3350 17 G PO PACK
17.0000 g | PACK | Freq: Every day | ORAL | Status: DC | PRN
  Filled 2014-01-13: qty 1

## 2014-01-13 MED ORDER — SODIUM CHLORIDE 0.9 % IJ SOLN
3.0000 mL | Freq: Two times a day (BID) | INTRAMUSCULAR | Status: DC
Start: 2014-01-13 — End: 2014-01-14
  Administered 2014-01-13: 3 mL via INTRAVENOUS

## 2014-01-13 MED ORDER — SODIUM CHLORIDE 0.9 % IV SOLN
250.0000 mL | INTRAVENOUS | Status: DC | PRN
Start: 2014-01-13 — End: 2014-01-14

## 2014-01-13 MED ORDER — LEVOTHYROXINE SODIUM 100 MCG PO TABS
100.0000 ug | ORAL_TABLET | Freq: Every day | ORAL | Status: DC
  Administered 2014-01-14: 100 ug via ORAL
  Filled 2014-01-13 (×2): qty 1

## 2014-01-13 MED ORDER — VANCOMYCIN HCL IN DEXTROSE 750-5 MG/150ML-% IV SOLN
750.0000 mg | Freq: Three times a day (TID) | INTRAVENOUS | Status: DC
  Administered 2014-01-14 (×2): 750 mg via INTRAVENOUS
  Filled 2014-01-13 (×3): qty 150

## 2014-01-13 MED ORDER — PIPERACILLIN-TAZOBACTAM 3.375 G IVPB
3.3750 g | Freq: Three times a day (TID) | INTRAVENOUS | Status: DC
  Administered 2014-01-13 – 2014-01-14 (×2): 3.375 g via INTRAVENOUS
  Filled 2014-01-13 (×4): qty 50

## 2014-01-13 MED ORDER — SALINE SPRAY 0.65 % NA SOLN
1.0000 | NASAL | Status: DC | PRN
  Filled 2014-01-13: qty 44

## 2014-01-13 MED ORDER — ACETAMINOPHEN 325 MG PO TABS: 650.0000 mg | ORAL_TABLET | ORAL | Status: DC | PRN

## 2014-01-13 MED ORDER — PHENYLEPHRINE HCL 0.25 % NA SOLN
1.0000 | Freq: Four times a day (QID) | NASAL | Status: DC | PRN
  Filled 2014-01-13: qty 15

## 2014-01-13 MED ORDER — SODIUM CHLORIDE 0.9 % IJ SOLN
3.0000 mL | INTRAMUSCULAR | Status: DC | PRN
Start: 2014-01-13 — End: 2014-01-14

## 2014-01-13 MED ORDER — AMLODIPINE BESYLATE 10 MG PO TABS
10.0000 mg | ORAL_TABLET | Freq: Every evening | ORAL | Status: DC
  Administered 2014-01-13: 10 mg via ORAL
  Filled 2014-01-13 (×2): qty 1

## 2014-01-13 MED ORDER — CLINDAMYCIN PHOSPHATE 600 MG/50ML IV SOLN
600.0000 mg | Freq: Once | INTRAVENOUS | Status: AC
  Administered 2014-01-13: 600 mg via INTRAVENOUS
  Filled 2014-01-13: qty 50

## 2014-01-13 MED ORDER — SODIUM CHLORIDE 0.9 % IV SOLN
1500.0000 mg | Freq: Once | INTRAVENOUS | Status: AC
  Administered 2014-01-13: 1500 mg via INTRAVENOUS
  Filled 2014-01-13: qty 1500

## 2014-01-13 MED ORDER — OXYCODONE HCL 5 MG PO TABS: 5.0000 mg | ORAL_TABLET | ORAL | Status: DC | PRN

## 2014-01-13 MED ORDER — MORPHINE SULFATE 4 MG/ML IJ SOLN: 4.0000 mg | INTRAMUSCULAR | Status: DC | PRN

## 2014-01-13 MED ORDER — LIDOCAINE HCL 2 % IJ SOLN
20.0000 mL | Freq: Once | INTRAMUSCULAR | Status: DC | PRN
  Filled 2014-01-13 (×2): qty 20

## 2014-01-13 MED ORDER — DEXAMETHASONE 4 MG PO TABS
4.0000 mg | ORAL_TABLET | Freq: Three times a day (TID) | ORAL | Status: DC
  Administered 2014-01-13 – 2014-01-14 (×3): 4 mg via ORAL
  Filled 2014-01-13 (×5): qty 1

## 2014-01-13 NOTE — ED Notes (Signed)
MD Karle Starch at bedside addressing questions and concerns of family members.

## 2014-01-13 NOTE — ED Notes (Signed)
Pt transported to XRAY °

## 2014-01-13 NOTE — ED Notes (Addendum)
Pt from home c/o epistaxis and blackend fingertips. She recently received platelets. Pt having pain in fingertips. Pt is edematous.

## 2014-01-13 NOTE — Consult Note (Signed)
Reason for Consult: finger tip infections Referring Physician: Dr. Eileen Barnes Mercedes Barnes is an 53 y.o. female.  HPI: The patient is a pleasant 53 year old female who presents to the emergency room setting today for evaluation of her right hand in particular her thumb middle and ring finger. She has a history of glioblastoma multiform with progressive worsening over the past few weeks in terms of symptomatology is noted to have significant thrombocytopenia secondary to chemotherapy. Patient currently sees Dr. Beryle Barnes for management of the glioblastoma. She was seen earlier this week for a followup check. She states she had a fingerstick performed and had noted swelling and pain about the middle finger aggressive in nature in addition she states that she has noted pain swelling and tenderness about the ring finger and to lesser extent the thumb. Currently, in the ER setting labs are:  Lab Results  Component Value Date   WBC 0.4* 01/13/2014   HGB 10.1* 01/13/2014   HCT 29.3* 01/13/2014   MCV 88.0 01/13/2014   PLT 21* 01/13/2014   Evaluation by Dr. Karle Barnes was worrisome for possible infectious process about the tips of the fingers thus hand surgery was consulted. Patient is currently receiving IV clindamycin, and was previously placed oral Cipro. She states that the fingers feel "a little better" since yesterday, but still has significant tenderness about the tips primarily the middle and ring finger. The chart was reviewed at length.   Past Medical History  Diagnosis Date  . Brain cancer 02/2009    GBM  . Hypothyroidism   . PE (pulmonary embolism) 05/2009    bilateral PE  . 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 Hypothyroid 09/05/2012  . Chronic anticoagulation 09/05/2012  . Pancytopenia due to chemotherapy 01/12/2014  . Chemotherapy induced thrombocytopenia 01/12/2014  . Epistaxis 01/12/2014    Due to low platelets  . Cellulitis of nose,  external 01/12/2014  . Ischemia of digits of hand 01/12/2014    Past Surgical History  Procedure Laterality Date  . Tubal ligation    . Craniotomy for temporal lobectomy  02/2009    anterior temperol lobectomy -  Dr Mercedes Barnes     Family History  Problem Relation Age of Onset  . Cancer Mother     cervical  . Stroke Mother     cause of death    Social History:  reports that she has quit smoking. She has never used smokeless tobacco. She reports that she does not drink alcohol. Her drug history is not on file.  Allergies:  Allergies  Allergen Reactions  . Co-Trimoxazole Injection [Sulfamethoxazole-Trimethoprim] Hives  . Pentamidine Other (See Comments)    Hypotension & weakness    Medications: I have reviewed the patient's current medications.  Results for orders placed during the hospital encounter of 01/13/14 (from the past 48 hour(s))  CBC WITH DIFFERENTIAL     Status: Abnormal   Collection Time    01/13/14 10:04 AM      Result Value Ref Range   WBC 0.4 (*) 4.0 - 10.5 K/uL   Comment: REPEATED TO VERIFY     CRITICAL RESULT CALLED TO, READ BACK BY AND VERIFIED WITH:     S.COE RN AT 1017 ON 28FEB15 BY C.BONGEL   RBC 3.33 (*) 3.87 - 5.11 MIL/uL   Hemoglobin 10.1 (*) 12.0 - 15.0 g/dL   HCT 29.3 (*) 36.0 - 46.0 %   MCV 88.0  78.0 - 100.0 fL   MCH 30.3  26.0 - 34.0 pg   MCHC 34.5  30.0 - 36.0 g/dL   RDW 17.0 (*) 11.5 - 15.5 %   Platelets 21 (*) 150 - 400 K/uL   Comment: REPEATED TO VERIFY     CRITICAL RESULT CALLED TO, READ BACK BY AND VERIFIED WITH:     S.COE RN AT 1017 ON 28FEB15 BY C.BONGEL     SPECIMEN CHECKED FOR CLOTS   Neutrophils Relative % 76  43 - 77 %   Lymphocytes Relative 21  12 - 46 %   Monocytes Relative 3  3 - 12 %   Eosinophils Relative 0  0 - 5 %   Basophils Relative 0  0 - 1 %   Neutro Abs 0.3 (*) 1.7 - 7.7 K/uL   Lymphs Abs 0.1 (*) 0.7 - 4.0 K/uL   Monocytes Absolute 0.0 (*) 0.1 - 1.0 K/uL   Eosinophils Absolute 0.0  0.0 - 0.7 K/uL   Basophils  Absolute 0.0  0.0 - 0.1 K/uL   WBC Morphology WHITE COUNT CONFIRMED ON SMEAR     Smear Review PLATELET COUNT CONFIRMED BY SMEAR    COMPREHENSIVE METABOLIC PANEL     Status: Abnormal   Collection Time    01/13/14 10:04 AM      Result Value Ref Range   Sodium 135 (*) 137 - 147 mEq/L   Potassium 4.4  3.7 - 5.3 mEq/L   Chloride 101  96 - 112 mEq/L   CO2 21  19 - 32 mEq/L   Glucose, Bld 280 (*) 70 - 99 mg/dL   BUN 25 (*) 6 - 23 mg/dL   Creatinine, Ser 0.50  0.50 - 1.10 mg/dL   Calcium 7.9 (*) 8.4 - 10.5 mg/dL   Total Protein 4.9 (*) 6.0 - 8.3 g/dL   Albumin 1.3 (*) 3.5 - 5.2 g/dL   AST 17  0 - 37 U/L   ALT 57 (*) 0 - 35 U/L   Alkaline Phosphatase 61  39 - 117 U/L   Total Bilirubin 0.2 (*) 0.3 - 1.2 mg/dL   GFR calc non Af Amer >90  >90 mL/min   GFR calc Af Amer >90  >90 mL/min   Comment: (NOTE)     The eGFR has been calculated using the CKD EPI equation.     This calculation has not been validated in all clinical situations.     eGFR's persistently <90 mL/min signify possible Chronic Kidney     Disease.  PROTIME-INR     Status: None   Collection Time    01/13/14 10:04 AM      Result Value Ref Range   Prothrombin Time 13.3  11.6 - 15.2 seconds   INR 1.03  0.00 - 1.49  APTT     Status: None   Collection Time    01/13/14 10:04 AM      Result Value Ref Range   aPTT 28  24 - 37 seconds  PREPARE PLATELET PHERESIS     Status: None   Collection Time    01/13/14 12:00 PM      Result Value Ref Range   Unit Number [id number]     Blood Component Type PLTPHER LR1     Unit division 00     Status of Unit ISSUED     Transfusion Status OK TO TRANSFUSE      Dg Hand Complete Right  01/13/2014   CLINICAL DATA:  Swelling  EXAM: RIGHT HAND - COMPLETE 3+ VIEW  COMPARISON:  None.  FINDINGS: Rings overlie the proximal phalanx ring finger. Mild osteopenia. Negative for fracture, dislocation, or other acute bone abnormality. Small dorsal marginal spurs from the distal phalanges of the index,  long, and little fingers. Regional soft tissues unremarkable.  IMPRESSION: 1. Negative for fracture or other acute abnormality.   Electronically Signed   By: Arne Cleveland M.D.   On: 01/13/2014 12:34    Review of Systems  Constitutional: Positive for malaise/fatigue.  Respiratory: Negative.   Cardiovascular: Negative.   Gastrointestinal: Positive for heartburn.  Genitourinary: Negative.   Musculoskeletal:       See history of present illness  Neurological: Positive for weakness and headaches.       See history of present illness  Endo/Heme/Allergies: Bruises/bleeds easily.   Blood pressure 145/88, pulse 94, temperature 98.3 F (36.8 C), temperature source Oral, resp. rate 16, SpO2 97.00%. Physical Exam The patient is pleasant, alert and oriented x3, she is accompanied by her family members at bedside HEENT: She has mild erythema about the nose with a darkish hue above the right nostril Chest: Equal expansions, respirations are nonlabored Abdomen: Nontender Right upper extremity: The patient has noted discoloration about the middle ring fingers soft tissue swelling erythema about the DIP regions. Does not extend past the DIPs there is no ascending cellulitis present she has exquisite tenderness with palpation about the distal tip of the thumb, middle finger and ring he has a necrotic skin along the eponychial region. The patient is wearing her wedding band on the ring finger as she has moved this from the left upper extremity given the lymph edema, stasis edema secondary to loss of function.  Assessment/Plan: Have discussed with she and her family at concerns for an infectious process, it does appear that the fingers have some degree of ecchymosis probable resultant eponychial process versus felon. I have discussed with the patient attempting inside aspiration and I&D for further diagnosis in agreement. After obtaining verbal consent, the tips of the finger were cleansed with Hibiclens  following this the middle finger and ring finger aspirated with a 22-gauge needle, I should note there was copious amount of purulent drainage expressed. Cultures were obtained. Discussed with the family at concerns and the need for a more formal I and D. we have discussed with the family and the patient had recommendations of hospital admit, platelet administration, and a more formal I&D later today versus tomorrow given her current finding. The family is in agreement with this plan. The hospitalist will be contacted for her admit. She will continue IV clindamycin and we will check on her later today as well as tomorrow more formal bedside procedure. I discussed all issues with the family at length. Marland Kitchen.We are planning surgery for your upper extremity. The risk and benefits of surgery include risk of bleeding infection anesthesia damage to normal structures and failure of the surgery to accomplish its intended goals of relieving symptoms and restoring function with this in mind we'll going to proceed. I have specifically discussed with the patient the pre-and postoperative regime and the does and don'ts and risk and benefits in great detail. Risk and benefits of surgery also include risk of dystrophy chronic nerve pain failure of the healing process to go onto completion and other inherent risks of surgery The relavent the pathophysiology of the disease/injury process, as well as the alternatives for treatment and postoperative course of action has been discussed in great detail with the patient who desires to proceed.  We will do  everything in our power to help you (the patient) restore function to the upper extremity. Is a pleasure to see this patient today.      Mercedes Barnes L 01/13/2014, 2:13 PM

## 2014-01-13 NOTE — ED Provider Notes (Signed)
CSN: [id number]     Arrival date & time 01/13/14  [id number] History   First MD Initiated Contact with Patient 01/13/14 0940     Chief Complaint  Patient presents with  . Epistaxis  . Cancer     (Consider location/radiation/quality/duration/timing/severity/associated sxs/prior Treatment) Patient is a 53 y.o. female presenting with nosebleeds.  Epistaxis  Pt with history of recurrent GBM which has not responded well to treatment over the last 2 months now with L hemiplegia was seen in the Oncology office yesterday for nose bleed and ?ischemic fingertips on R hand. She was found to be pancytopenic, particularly WBC and PLT, attributed to recent chemotherapy. Given platelet infusion and started on Abx (Cipro and Keflex). Husband states nose bleed stopped and fingertips looked better yesterday evening, but symptoms returned this morning and so he brought her back for further evaluation. Denies any fevers.  No significant pain. They are planning to begin hospice care soon.   Past Medical History  Diagnosis Date  . Brain cancer 02/2009    GBM  . Hypothyroidism   . PE (pulmonary embolism) 05/2009    bilateral PE  . 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 Hypothyroid 09/05/2012  . Chronic anticoagulation 09/05/2012  . Pancytopenia due to chemotherapy 01/12/2014  . Chemotherapy induced thrombocytopenia 01/12/2014  . Epistaxis 01/12/2014    Due to low platelets  . Cellulitis of nose, external 01/12/2014  . Ischemia of digits of hand 01/12/2014   Past Surgical History  Procedure Laterality Date  . Tubal ligation    . Craniotomy for temporal lobectomy  02/2009    anterior temperol lobectomy -  Dr Ashok Pall    Family History  Problem Relation Age of Onset  . Cancer Mother     cervical  . Stroke Mother     cause of death   History  Substance Use Topics  . Smoking status: Former Smoker -- 0.25 packs/day for 1 years  . Smokeless tobacco: Never  Used  . Alcohol Use: No   OB History   Grav Para Term Preterm Abortions TAB SAB Ect Mult Living                 Review of Systems  HENT: Positive for nosebleeds.    All other systems reviewed and are negative except as noted in HPI.     Allergies  Co-trimoxazole injection and Pentamidine  Home Medications   Current Outpatient Rx  Name  Route  Sig  Dispense  Refill  . acetaminophen (TYLENOL) 325 MG tablet   Oral   Take 325 mg by mouth. Take 650 mg by mouth every 4 (four) hours as needed.         Marland Kitchen amLODipine (NORVASC) 10 MG tablet      TAKE 1 TABLET BY MOUTH EVERY DAY   30 tablet   6   . calcium-vitamin D (OSCAL WITH D) 250-125 MG-UNIT per tablet   Oral   Take 1 tablet by mouth daily.          . cephALEXin (KEFLEX) 500 MG capsule   Oral   Take 1 capsule (500 mg total) by mouth 3 (three) times daily.   30 capsule   1   . ciprofloxacin (CIPRO) 500 MG tablet   Oral   Take 1 tablet (500 mg total) by mouth 2 (two) times daily.   20 tablet   1   . dexamethasone (DECADRON) 4 MG tablet   Oral  Take 4 mg by mouth 3 (three) times daily.         Marland Kitchen levothyroxine (SYNTHROID) 100 MCG tablet   Oral   Take by mouth. Take 100 mcg by mouth daily. Take on an empty stomach with a glass of water at least 30-60 minutes before breakfast.         . lomustine (CEENU) 10 MG capsule   Oral   Take 1 capsule (10 mg total) by mouth once. Take on an empty stomach 1 hour before or 2 hours after meals. Caution:chemotherapy.  Take with 100 mg tab to equal 130 mg total dose q 6 wks.   3 capsule   4     Script sent to [facility] @ [phone].   Marland Kitchen lomustine (CEENU) 100 MG capsule   Oral   Take 1 capsule (100 mg total) by mouth once. Take on an emtpy stomach 1 hour before or 2 hours after meals. Caution:chemotherapy.  Take every 6 wks with (3) tabs of 10 mg tabs to equal 130 mg total dose.   1 capsule   4     Script faxed to [facility] @ [phone]   . loperamide  (IMODIUM) 2 MG capsule   Oral   Take 2 mg by mouth as needed.          . ondansetron (ZOFRAN) 8 MG tablet   Oral   Take 8 mg by mouth every 8 (eight) hours as needed. Take 1 hours before Temodar.  (Prescribed by [place])         . sodium chloride (V-R NASAL SPRAY SALINE) 0.65 % nasal spray      1 spray by Nasal route as needed.          BP 153/94  Pulse 96  Temp(Src) 98.2 F (36.8 C) (Oral)  Resp 16  SpO2 96% Physical Exam  Nursing note and vitals reviewed. Constitutional: No distress.  HENT:  Head: Normocephalic and atraumatic.  Dried blood at both nares R>L, no active bleeding, erythema and induration of soft tissue nose  Eyes: EOM are normal. Pupils are equal, round, and reactive to light.  Neck: Normal range of motion. Neck supple.  Cardiovascular: Normal rate, normal heart sounds and intact distal pulses.   Pulmonary/Chest: Effort normal and breath sounds normal.  Abdominal: Bowel sounds are normal. She exhibits distension. There is no tenderness. There is no rebound.  Musculoskeletal: She exhibits edema (edema of L hand, BLE) and tenderness.  There is discoloration and TTP to the tips of R 3rd and 4th digit, ischemia vs felon  Neurological: She is alert.  L facial droop and L hemiparesis  Skin: Skin is warm and dry. No rash noted.  Psychiatric: She has a normal mood and affect.    ED Course  Procedures (including critical care time) Labs Review Labs Reviewed  CBC WITH DIFFERENTIAL - Abnormal; Notable for the following:    WBC 0.4 (*)    RBC 3.33 (*)    Hemoglobin 10.1 (*)    HCT 29.3 (*)    RDW 17.0 (*)    Platelets 21 (*)    Neutro Abs 0.3 (*)    Lymphs Abs 0.1 (*)    Monocytes Absolute 0.0 (*)    All other components within normal limits  COMPREHENSIVE METABOLIC PANEL - Abnormal; Notable for the following:    Sodium 135 (*)    Glucose, Bld 280 (*)    BUN 25 (*)    Calcium 7.9 (*)  Total Protein 4.9 (*)    Albumin 1.3 (*)    ALT 57 (*)    Total  Bilirubin 0.2 (*)    All other components within normal limits  PROTIME-INR  APTT  PREPARE PLATELET PHERESIS   Imaging Review Dg Hand Complete Right  01/13/2014   CLINICAL DATA:  Swelling  EXAM: RIGHT HAND - COMPLETE 3+ VIEW  COMPARISON:  None.  FINDINGS: Rings overlie the proximal phalanx ring finger. Mild osteopenia. Negative for fracture, dislocation, or other acute bone abnormality. Small dorsal marginal spurs from the distal phalanges of the index, long, and little fingers. Regional soft tissues unremarkable.  IMPRESSION: 1. Negative for fracture or other acute abnormality.   Electronically Signed   By: Arne Cleveland M.D.   On: 01/13/2014 12:34     EKG Interpretation None      MDM   Final diagnoses:  Paronychia  Thrombocytopenia  Neutropenia    Labs reviewed, not significantly improved from yesterday. She still has low PLT and neutropenia. Discussed with Dr. Benay Spice on call for Hem/Onc who recommends PLT transfusion and admission for Abx. Patient and Family would prefer to be at home. She recognizes that her cancer is not responding well to treatment and her intention is to enter Hospice care soon. Discussed with Dr. Amedeo Plenty on call for Hand to eval her fingers, his PA is at bedside now, may do small incision to see if there is pus in her fingertips. In the meantime, will initiate Clindamycin for improved MRSA coverage of SSTI in anticipation of patient being discharged later today.    2:46 PM Per Hand PA, moderate amount of pus obtained on incision, sent for culture. Pt now amenable to admission for PLT and Abx.    Lavana Huckeba B. Karle Starch, MD 01/13/14 1446

## 2014-01-13 NOTE — ED Notes (Signed)
MD Sheldon at bedside.  

## 2014-01-13 NOTE — ED Notes (Signed)
Hand sugery at bedside.

## 2014-01-13 NOTE — H&P (Signed)
History and Physical  Mercedes Barnes HKV:[id number] DOB: 1961/08/06 DOA: 01/13/2014  Referring physician: Dr. Calvert Cantor PCP: Abigail Miyamoto, MD   Chief Complaint: Nose bleeds.   History of Present Illness: Mercedes Barnes is an 53 y.o. 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 Beryle Beams who presents to the ER with recurrent epistaxis and ongoing ischemic changes to her right hand.  She was seen by Dr.Buchanan who used a needle to relieve pressure on 2 of her fingers yielding pus with recommendations to proceed with surgery once her platelet count is stable.  Dr. Benay Spice was contacted who recommended admission for platelet transfusion and antibiotics.  Review of Systems: Constitutional: No fever, + chills;  Appetite diminished; + weight loss, no weight gain, + fatigue.  HEENT: + Progressive visual loss, no pharyngitis, no dysphagia CV: No chest pain, no palpitations, no PND.  Resp: + SOB, no cough, no pleuritic pain. GI: No nausea, no vomiting, no diarrhea, no melena, no hematochezia, no constipation.  GU: No dysuria, no hematuria, no frequency, no urgency. MSK: no myalgias, no arthralgias.  Neuro:  No headache, left hemiplegia, no history of seizures.  Psych: No depression, no anxiety.  Endo: No heat intolerance, no cold intolerance, no polyuria, no polydipsia  Skin: No rashes, no skin lesions.  Heme: + easy bruising, + petechial rash, +epistaxis.    Past Medical History Past Medical History  Diagnosis Date  . Brain cancer 02/2009    GBM  . Hypothyroidism   . PE (pulmonary embolism) 05/2009   bilateral PE  . 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 Hypothyroid 09/05/2012  . Chronic anticoagulation 09/05/2012  . Pancytopenia due to chemotherapy 01/12/2014  . Chemotherapy induced thrombocytopenia 01/12/2014  . Epistaxis 01/12/2014    Due to low platelets  . Cellulitis of nose, external 01/12/2014  . Ischemia of digits of hand 01/12/2014     Past Surgical History Past Surgical History  Procedure Laterality Date  . Tubal ligation    . Craniotomy for temporal lobectomy  02/2009    anterior temperol lobectomy -  Dr Ashok Pall      Social History: History   Social History  . Marital Status: Married    Spouse Name: N/A    Number of Children: N/A  . Years of Education: N/A   Occupational History  . Not on file.   Social History Main Topics  . Smoking status: Former Smoker -- 0.25 packs/day for 1 years  . Smokeless tobacco: Never Used  . Alcohol Use: No  . Drug Use:   . Sexual Activity:    Other Topics Concern  . Not on file   Social History Narrative  . No narrative on file    Family History:  Family History  Problem Relation Age of Onset  . Cancer Mother     cervical  . Stroke Mother     cause of death    Allergies: Co-trimoxazole injection and Pentamidine  Meds: Prior to Admission medications   Medication Sig Start Date End Date Taking? Authorizing Provider  acetaminophen (TYLENOL) 325 MG tablet Take 650 mg by mouth every 4 (  four) hours as needed for mild pain or moderate pain.    Yes Historical Provider, MD  amLODipine (NORVASC) 10 MG tablet Take 10 mg by mouth every evening.   Yes Historical Provider, MD  ciprofloxacin (CIPRO) 500 MG tablet Take 500 mg by mouth 2 (two) times daily. 01/12/14 01/22/14 Yes Historical Provider, MD  dexamethasone (DECADRON) 4 MG tablet Take 4 mg by mouth 3 (three) times daily. 12/20/13  Yes Annia Belt, MD  levothyroxine (SYNTHROID) 100 MCG tablet Take 100 mcg by  mouth daily before breakfast.    Yes Historical Provider, MD  ondansetron (ZOFRAN) 8 MG tablet Take 8 mg by mouth every 8 (eight) hours as needed for nausea or vomiting.    Yes Historical Provider, MD  sodium chloride (V-R NASAL SPRAY SALINE) 0.65 % nasal spray 1 spray by Nasal route as needed.   Yes Historical Provider, MD    Physical Exam: Filed Vitals:   01/13/14 1349 01/13/14 1405 01/13/14 1406 01/13/14 1415  BP: 152/95 145/88  141/84  Pulse: 111 95 94 92  Temp: 98.3 F (36.8 C) 98.3 F (36.8 C)    TempSrc: Oral Oral    Resp:      SpO2: 98% 97% 97% 96%     Physical Exam: Blood pressure 141/84, pulse 92, temperature 98.3 F (36.8 C), temperature source Oral, resp. rate 16, SpO2 96.00%. Gen: Ill-appearing female in no acute distress. Head: Normocephalic, atraumatic. Eyes: Anisocoria with left pupil more dilated, disconjugate eye movements, sclerae nonicteric. Mouth/nose: Oropharynx the posterior pharyngeal erythema or exudates. Dry blood right naris. Small ischemic area to tip of nose. Neck: Supple, no thyromegaly, no lymphadenopathy, no jugular venous distention. Chest: Lungs diminished but clear throughout. CV: Heart sounds are regular with no murmurs, rubs, or gallops. Abdomen: Soft, nontender, nondistended with normal active bowel sounds. Extremities: Extremities show left sided 2+ and edema to the upper and lower extremity. Right hand digits wrapped status post hand surgeon intervention. Dressings not removed. Skin: Warm and dry. Diffuse petechial rash. Scattered ecchymosis. Neuro: Slightly lethargic; left hemiparesis with clear evidence of cranial nerve impairment. Psych: Mood and affect depressed.  Labs on Admission:  Basic Metabolic Panel:  Recent Labs Lab 01/08/14 1116 01/13/14 1004  NA 136 135*  K 4.3 4.4  CL  --  101  CO2 21* 21  GLUCOSE 168* 280*  BUN 26.8* 25*  CREATININE 0.5* 0.50  CALCIUM 8.0* 7.9*   Liver Function Tests:  Recent Labs Lab  01/08/14 1116 01/13/14 1004  AST 17 17  ALT 40 57*  ALKPHOS 65 61  BILITOT 0.31 0.2*  PROT 5.0* 4.9*  ALBUMIN 2.0* 1.3*   CBC:  Recent Labs Lab 01/08/14 1116 01/12/14 0957 01/13/14 1004  WBC 2.5* 0.5* 0.4*  NEUTROABS 2.2 0.3* 0.3*  HGB 12.5 11.4* 10.1*  HCT 36.4 33.2* 29.3*  MCV 89.2 88.1 88.0  PLT 28 Platelet count confirmed by slide estimate* 11* 21*   Radiological Exams on Admission: Dg Hand Complete Right  01/13/2014   CLINICAL DATA:  Swelling  EXAM: RIGHT HAND - COMPLETE 3+ VIEW  COMPARISON:  None.  FINDINGS: Rings overlie the proximal phalanx ring finger. Mild osteopenia. Negative for fracture, dislocation, or other acute bone abnormality. Small dorsal marginal spurs from the distal phalanges of the index, long, and little fingers. Regional soft tissues unremarkable.  IMPRESSION: 1. Negative for fracture or other acute abnormality.   Electronically Signed   By: Arne Cleveland M.D.   On: 01/13/2014 12:34  Assessment/Plan Principal Problem:   Paronychia Patient will be admitted for medical stabilization so that she can proceed with left hand surgery. We'll place her on empiric antibiotics including vancomycin and Zosyn. Active Problems:   Glioblastoma multiforme of temporal lobe Recently referred to hospice. We'll notify Dr. Beryle Beams of the patient's admission. Continue Decadron.   Hypothyroid Continue home dose of Synthroid.   Pancytopenia due to chemotherapy / Neutropenia Family wants to minimize intervention but does want comfort measures. We'll transfuse platelets for treatment of symptomatic epistaxis. Hemoglobin stable with no current indication for blood transfusion.   Chemotherapy induced thrombocytopenia Platelet infusion ordered.   Epistaxis Platelet infusion ordered, can use ice packs and Afrin nasal spray for active bleeding.   Cellulitis of nose, external On vancomycin and Zosyn.   Ischemia of digits of hand Seen by hand surgeon with plans for  operative intervention and platelet count stable.   Hyperglycemia Likely steroid induced. Given patient's desire for comfort, would avoid CBG checks and treatment.   High blood pressure Continue Norvasc.   Chemotherapy induced neutropenia Neutropenic precautions.   DVT prophylaxis SCDs if patient tolerates.  Code Status: DNR Family Communication: Family updated at bedside. Disposition Plan: Home with hospice.  Time spent: 1 hour.  Trivia Heffelfinger [facility] Pager [phone] Cell: [phone]   If 7PM-7AM, please contact night-coverage [email or web address] Password [email or web address] 01/13/2014, 2:22 PM    **Disclaimer: This note was dictated with voice recognition software. Similar sounding words can inadvertently be transcribed and this note may contain transcription errors which may not have been corrected upon publication of note.**

## 2014-01-13 NOTE — Progress Notes (Signed)
ANTIBIOTIC CONSULT NOTE - INITIAL  Pharmacy Consult for vancomycin/zosyn Indication: cellulitis  Allergies  Allergen Reactions  . Co-Trimoxazole Injection [Sulfamethoxazole-Trimethoprim] Hives  . Pentamidine Other (See Comments)    Hypotension & weakness    Patient Measurements:   Adjusted Body Weight:   Vital Signs: Temp: 98.6 F (37 C) (02/28 1500) Temp src: Oral (02/28 1405) BP: 148/87 mmHg (02/28 1524) Pulse Rate: 103 (02/28 1524) Intake/Output from previous day:   Intake/Output from this shift: Total I/O In: 516 [Blood:516] Out: -   Labs:  Recent Labs  01/12/14 0957 01/13/14 1004  WBC 0.5* 0.4*  HGB 11.4* 10.1*  PLT 11* 21*  CREATININE  --  0.50   The CrCl is unknown because both a height and weight (above a minimum accepted value) are required for this calculation. No results found for this basename: Letta Median, VANCORANDOM, [place], GENTPEAK, [place], [place], TOBRAPEAK, TOBRARND, AMIKACINPEAK, AMIKACINTROU, AMIKACIN,  in the last 72 hours   Microbiology: Recent Results (from the past 720 hour(s))  TECHNOLOGIST REVIEW     Status: None   Collection Time    01/08/14 11:16 AM      Result Value Ref Range Status   Technologist Review rare meta   Final  GRAM STAIN     Status: None   Collection Time    01/13/14  1:54 PM      Result Value Ref Range Status   Specimen Description FINGER TIP   Final   Special Requests Immunocompromised   Final   Gram Stain     Final   Value: NO WBC SEEN     ABUNDANT GRAM POSITIVE COCCI     Gram Stain Report Called to,Read Back By and Verified With: B.DUNCAN RN AT 1610 ON 28FEB15 BY C.BONGEL   Report Status 01/13/2014 FINAL   Final    Medical History: Past Medical History  Diagnosis Date  . Brain cancer 02/2009    GBM  . Hypothyroidism   . PE (pulmonary embolism) 05/2009    bilateral PE  . 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 Hypothyroid  09/05/2012  . Chronic anticoagulation 09/05/2012  . Pancytopenia due to chemotherapy 01/12/2014  . Chemotherapy induced thrombocytopenia 01/12/2014  . Epistaxis 01/12/2014    Due to low platelets  . Cellulitis of nose, external 01/12/2014  . Ischemia of digits of hand 01/12/2014    Assessment: 48 YOF relapsed glioblastoma brain cancer. Given platelet transfusion (platelets = 11) at [facility] 2/27 for persistent epistaxis. Also developing erythema on nose with discoloration of fingertips. She was given Rx for ciprofloxacin 2/27.  Ortho performed bedside I&D of finger expressing purulent material and likely need for additional I&D in the OR.   2/28 >> clindamycin x 1 in ED 2/28 >> vancomycin >> 2/28 >> zosyn >>    Tmax: WBCs: 0.4 (ANC = 300) Renal: Scr = 0.5 for est CrCl > 170ml/min  2/28 Wound (finger): pending  Goal of Therapy:  Vancomycin trough level 15-20 mcg/ml for neutropenia  Plan:   Vancomycin 1500mg  IV x 1 then 750mg  IV q8h  Check vancomycin level at steady state, question of SCr accurately reflects renal fx  Zosyn 3.375gm IV q8h with 4h infusion  Doreene Eland, PharmD, BCPS.   Pager: [phone]  01/13/2014,3:51 PM

## 2014-01-14 LAB — CBC
HCT: 23.7 % — ABNORMAL LOW (ref 36.0–46.0)
Hemoglobin: 8.2 g/dL — ABNORMAL LOW (ref 12.0–15.0)
MCH: 30.3 pg (ref 26.0–34.0)
MCHC: 34.6 g/dL (ref 30.0–36.0)
MCV: 87.5 fL (ref 78.0–100.0)
PLATELETS: 46 10*3/uL — AB (ref 150–400)
RBC: 2.71 MIL/uL — ABNORMAL LOW (ref 3.87–5.11)
RDW: 17.6 % — AB (ref 11.5–15.5)
WBC: 0.4 10*3/uL — AB (ref 4.0–10.5)

## 2014-01-14 MED ORDER — AMLODIPINE BESYLATE 10 MG PO TABS
10.0000 mg | ORAL_TABLET | Freq: Every day | ORAL | Status: DC
Start: 2014-01-14 — End: 2014-01-14
  Filled 2014-01-14: qty 1

## 2014-01-14 MED ORDER — OXYCODONE HCL 5 MG PO TABS: 5.0000 mg | ORAL_TABLET | ORAL | Status: AC | PRN

## 2014-01-14 MED ORDER — LEVOTHYROXINE SODIUM 100 MCG PO TABS
100.0000 ug | ORAL_TABLET | Freq: Every day | ORAL | Status: DC
  Filled 2014-01-14: qty 1

## 2014-01-14 MED ORDER — HEPARIN SOD (PORK) LOCK FLUSH 100 UNIT/ML IV SOLN: 250.0000 [IU] | INTRAVENOUS | Status: DC | PRN

## 2014-01-14 MED ORDER — SODIUM CHLORIDE 0.9 % IV SOLN
250.0000 mL | Freq: Once | INTRAVENOUS | Status: DC
Start: 2014-01-14 — End: 2014-01-14

## 2014-01-14 MED ORDER — SODIUM CHLORIDE 0.9 % IJ SOLN: 3.0000 mL | INTRAMUSCULAR | Status: DC | PRN

## 2014-01-14 MED ORDER — DEXAMETHASONE 4 MG PO TABS
4.0000 mg | ORAL_TABLET | Freq: Three times a day (TID) | ORAL | Status: DC
  Filled 2014-01-14 (×4): qty 1

## 2014-01-14 MED ORDER — HEPARIN SOD (PORK) LOCK FLUSH 100 UNIT/ML IV SOLN: 500.0000 [IU] | Freq: Every day | INTRAVENOUS | Status: DC | PRN

## 2014-01-14 MED ORDER — LIDOCAINE-EPINEPHRINE (PF) 1 %-1:200000 IJ SOLN
30.0000 mL | Freq: Once | INTRAMUSCULAR | Status: DC
  Filled 2014-01-14 (×2): qty 30

## 2014-01-14 MED ORDER — SODIUM CHLORIDE 0.9 % IJ SOLN: 10.0000 mL | INTRAMUSCULAR | Status: DC | PRN

## 2014-01-14 NOTE — Discharge Summary (Signed)
Physician Discharge Summary  Mercedes Barnes FAO:[id number] DOB: 10/16/1961 DOA: 01/13/2014  PCP: Abigail Miyamoto, MD  Admit date: 01/13/2014 Discharge date: 01/14/2014  Recommendations for Outpatient Follow-up:  1. F/U with Dr. Amedeo Plenty 01/15/14. 2. Recommend re-check CBC in 1 week. 3. Recommend F/U culture results with adjustment of antibiotics, if needed.  Discharge Diagnoses:  Principal Problem:    Paronychia in the setting of neutropenia Active Problems:    Glioblastoma multiforme of temporal lobe    Hypothyroid    Pancytopenia due to chemotherapy    Chemotherapy induced thrombocytopenia    Epistaxis    Cellulitis of nose, external    Ischemia of digits of hand    Hyperglycemia    High blood pressure    Chemotherapy induced neutropenia   Discharge Condition: Stable but terminal.  Diet recommendation: Regular.  History of present illness:  Mercedes Barnes is an 53 y.o. 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 Beryle Beams who was admitted 01/13/14 with recurrent epistaxis and ongoing ischemic changes to her right hand.  Hospital Course by problem:  Principal Problem:  Paronychia  Patient was admitted for medical stabilization so that she can proceed with left hand surgery. Treated with empiric antibiotics including vancomycin and Zosyn. Cultures obtained of digits following incision and drainage. We'll discharge home on Cipro per Dr. Vanetta Shawl recommendations. Followup culture results and adjust antibiotic therapy if indicated. Active Problems:  Glioblastoma multiforme of  temporal lobe  Recently referred to hospice.  Continue Decadron.  Hypothyroid  Continue home dose of Synthroid.  Pancytopenia due to chemotherapy / Neutropenia  Family wanted to minimize intervention but did want comfort measures. Given a unit of platelets 01/13/14 for symptomatic epistaxis. Hemoglobin stable with no current indication for blood transfusion.  Chemotherapy induced thrombocytopenia  Platelet infusion given. Platelet count 46 this morning.  Epistaxis  Platelet infusion ordered, can use ice packs and Afrin nasal spray for active bleeding.  Cellulitis of nose, external  On vancomycin and Zosyn. Can resume Cipro at discharge. Ischemia of digits of hand  Seen by hand surgeon, status post I&D 01/14/14.  Hyperglycemia  Likely steroid induced. Given patient's desire for comfort, would avoid CBG checks and treatment. The patient and her husband confirmed their desire to avoid CBG checks, insulin.  High blood pressure  Continue Norvasc.  Chemotherapy induced neutropenia  Neutropenic precautions maintained.   Procedures:  Bedside I&D of deep abscess of the middle finger with nail plate removal and copious irrigation, I&D of ring finger with nail plate removal and copious irrigation as well as I&D of thumb. Devitalized necrotic tissue was noted.  Consultations:  Dr. Roseanne Kaufman, Orthopedics  Discharge Exam: Filed Vitals:   01/14/14 0610  BP: 144/82  Pulse: 84  Temp: 98.2 F (36.8 C)  Resp: 16   Filed Vitals:   01/13/14 1524 01/13/14 1545 01/13/14 2155 01/14/14 0610  BP: 148/87 159/105 152/90 144/82  Pulse: 103 106 86 84  Temp:  98.1 F (36.7 C) 98.4 F (36.9 C) 98.2 F (36.8 C)  TempSrc:  Oral Oral Oral  Resp: 20 18 20 16   Height:  5' 3.6" (1.615 m)    Weight:  62.052  kg (136 lb 12.8 oz)    SpO2: 96% 96% 100% 97%    Gen:  NAD Cardiovascular:  Tachycardic, No M/R/G Respiratory: Lungs CTAB Gastrointestinal: Abdomen soft, NT/ND with normal active bowel  sounds. Extremities: Dressings to right hand intact, left upper and lower extremity edema, 2+   Discharge Instructions  Discharge Orders   Future Appointments Provider Department Dept Phone   01/15/2014 1:30 PM [facility] [phone]   01/22/2014 9:15 AM [facility] [phone]   01/22/2014 9:30 AM Annia Belt, MD [facility] [phone]   01/22/2014 10:30 AM [facility] [phone]   02/05/2014 1:15 PM [facility] [phone]   02/05/2014 1:45 PM Owens Shark, NP [facility] [phone]   02/05/2014 2:15 PM [facility] [phone]   Future Orders Complete By Expires   Call MD for:  severe uncontrolled pain  As directed    Call MD for:  temperature >100.4  As directed    Call MD for:  As directed    Scheduling Instructions:     Worsening appearance of black spot on your nose, or enlarging blister on your left thigh.   Diet general  As directed    Discharge instructions  As directed    Comments:     You were cared for by Dr. Jacquelynn Cree  (a hospitalist) during your hospital stay. If you have any questions about your discharge medications or the care you received while you were in the hospital after you are discharged, you can call the unit and ask to speak with the hospitalist on call if the hospitalist that took care of you is not available. Once you are discharged, your primary care physician will handle any further medical issues. Please note that NO REFILLS for any discharge medications will be authorized once you are discharged, as it is imperative that you return to your primary care physician (or establish a relationship with a primary care physician if you do not have one) for  your aftercare needs so that they can reassess your need for medications and monitor your lab values.  Any outstanding tests can be reviewed by your PCP at your follow up visit.  It is also important to review any medicine changes with your PCP.  Please bring these d/c instructions with you to your next visit so your physician can review these changes with you.  If you do not have a primary care physician, you can call [phone] for a physician referral.  It is highly recommended that you obtain a PCP for hospital follow up.   Increase activity slowly  As directed        Medication List         acetaminophen 325 MG tablet  Commonly known as:  TYLENOL  Take 650 mg by mouth every 4 (four) hours as needed for mild pain or moderate pain.     amLODipine 10 MG tablet  Commonly known as:  NORVASC  Take 10 mg by mouth every evening.     ciprofloxacin 500 MG tablet  Commonly known as:  CIPRO  Take 500 mg by mouth 2 (two) times daily.     dexamethasone 4 MG tablet  Commonly known as:  DECADRON  Take 4 mg by mouth 3 (three)  times daily.     ondansetron 8 MG tablet  Commonly known as:  ZOFRAN  Take 8 mg by mouth every 8 (eight) hours as needed for nausea or vomiting.     oxyCODONE 5 MG immediate release tablet  Commonly known as:  Oxy IR/ROXICODONE  Take 1 tablet (5 mg total) by mouth every 4 (four) hours as needed for moderate pain.     SYNTHROID 100 MCG tablet  Generic drug:  levothyroxine  Take 100 mcg by mouth daily before breakfast.     V-R NASAL SPRAY SALINE 0.65 % nasal spray  Generic drug:  sodium chloride  1 spray by Nasal route as needed.           Follow-up Information   Schedule an appointment as soon as possible for a visit with FRIED, Jaymes Graff, MD. (As needed)    Specialty:  Family Medicine   Contact information:   [place] [address] [phone]       Follow up with Annia Belt, MD. Schedule an appointment as soon as possible for a visit  in 1 week.   Specialty:  Oncology   Contact information:   [place]. [place] [address] [phone]       Follow up with Paulene Floor, MD On 01/15/2014.   Specialty:  Orthopedic Surgery   Contact information:   [address] [place] [address] [phone]        The results of significant diagnostics from this hospitalization (including imaging, microbiology, ancillary and laboratory) are listed below for reference.    Significant Diagnostic Studies: Dg Hand Complete Right  January 20, 2014   CLINICAL DATA:  Swelling  EXAM: RIGHT HAND - COMPLETE 3+ VIEW  COMPARISON:  None.  FINDINGS: Rings overlie the proximal phalanx ring finger. Mild osteopenia. Negative for fracture, dislocation, or other acute bone abnormality. Small dorsal marginal spurs from the distal phalanges of the index, long, and little fingers. Regional soft tissues unremarkable.  IMPRESSION: 1. Negative for fracture or other acute abnormality.   Electronically Signed   By: Arne Cleveland M.D.   On: 2014/01/20 12:34    Labs:  Basic Metabolic Panel:  Recent Labs Lab 01/08/14 1116 2014-01-20 1004  NA 136 135*  K 4.3 4.4  CL  --  101  CO2 21* 21  GLUCOSE 168* 280*  BUN 26.8* 25*  CREATININE 0.5* 0.50  CALCIUM 8.0* 7.9*   GFR Estimated Creatinine Clearance: 69.9 ml/min (by C-G formula based on Cr of 0.5). Liver Function Tests:  Recent Labs Lab 01/08/14 1116 01/20/2014 1004  AST 17 17  ALT 40 57*  ALKPHOS 65 61  BILITOT 0.31 0.2*  PROT 5.0* 4.9*  ALBUMIN 2.0* 1.3*   Coagulation profile  Recent Labs Lab 20-Jan-2014 1004  INR 1.03    CBC:  Recent Labs Lab 01/08/14 1116 01/12/14 0957 01-20-14 1004 01/14/14 0416  WBC 2.5* 0.5* 0.4* 0.4*  NEUTROABS 2.2 0.3* 0.3*  --   HGB 12.5 11.4* 10.1* 8.2*  HCT 36.4 33.2* 29.3* 23.7*  MCV 89.2 88.1 88.0 87.5  PLT 28 Platelet count confirmed by slide estimate* 11* 21* 46*   Microbiology Recent Results (from the past 240  hour(s))  TECHNOLOGIST REVIEW     Status: None   Collection Time    01/08/14 11:16 AM      Result Value Ref Range Status   Technologist Review rare meta   Final  GRAM STAIN  Status: None   Collection Time    01/13/14  1:54 PM      Result Value Ref Range Status   Specimen Description FINGER TIP   Final   Special Requests Immunocompromised   Final   Gram Stain     Final   Value: NO WBC SEEN     ABUNDANT GRAM POSITIVE COCCI     Gram Stain Report Called to,Read Back By and Verified With: B.DUNCAN RN AT [id number] ON [id number] BY C.BONGEL   Report Status 01/13/2014 FINAL   Final  ANAEROBIC CULTURE     Status: None   Collection Time    01/13/14  1:56 PM      Result Value Ref Range Status   Specimen Description FINGER TIP   Final   Special Requests Immunocompromised   Final   Gram Stain PENDING   Incomplete   Culture     Final   Value: NO ANAEROBES ISOLATED; CULTURE IN PROGRESS FOR 5 DAYS     Performed at [facility]   Report Status PENDING   Incomplete  WOUND CULTURE     Status: None   Collection Time    01/13/14  2:38 PM      Result Value Ref Range Status   Specimen Description FINGER TIP   Final   Special Requests Immunocompromised   Final   Gram Stain     Final   Value: RARE WBC PRESENT, PREDOMINANTLY PMN     NO SQUAMOUS EPITHELIAL CELLS SEEN     ABUNDANT GRAM POSITIVE COCCI     Gram Stain Report Called to,Read Back By and Verified With: Gram Stain Report Called to,Read Back By and Verified WithMagda Bernheim RN @ [phone] BY C BONGEL Performed by [facility]     Performed at [facility]   Culture     Final   Value: NO GROWTH 1 DAY     Performed at [facility]   Report Status PENDING   Incomplete    Time coordinating discharge: 35 minutes.  Signed:  RAMA,CHRISTINA  Pager [phone] [facility] 01/14/2014, 12:39 PM

## 2014-01-14 NOTE — Discharge Instructions (Signed)
Abscess An abscess is an infected area that contains a collection of pus and debris.It can occur in almost any part of the body. An abscess is also known as a furuncle or boil. CAUSES  An abscess occurs when tissue gets infected. This can occur from blockage of oil or sweat glands, infection of hair follicles, or a minor injury to the skin. As the body tries to fight the infection, pus collects in the area and creates pressure under the skin. This pressure causes pain. People with weakened immune systems have difficulty fighting infections and get certain abscesses more often.  SYMPTOMS Usually an abscess develops on the skin and becomes a painful mass that is red, warm, and tender. If the abscess forms under the skin, you may feel a moveable soft area under the skin. Some abscesses break open (rupture) on their own, but most will continue to get worse without care. The infection can spread deeper into the body and eventually into the bloodstream, causing you to feel ill.  DIAGNOSIS  Your caregiver will take your medical history and perform a physical exam. A sample of fluid may also be taken from the abscess to determine what is causing your infection. TREATMENT  Your caregiver may prescribe antibiotic medicines to fight the infection. However, taking antibiotics alone usually does not cure an abscess. Your caregiver may need to make a small cut (incision) in the abscess to drain the pus. In some cases, gauze is packed into the abscess to reduce pain and to continue draining the area. HOME CARE INSTRUCTIONS   Only take over-the-counter or prescription medicines for pain, discomfort, or fever as directed by your caregiver.  If you were prescribed antibiotics, take them as directed. Finish them even if you start to feel better.  If gauze is used, follow your caregiver's directions for changing the gauze.  To avoid spreading the infection:  Keep your draining abscess covered with a  bandage.  Wash your hands well.  Do not share personal care items, towels, or whirlpools with others.  Avoid skin contact with others.  Keep your skin and clothes clean around the abscess.  Keep all follow-up appointments as directed by your caregiver. SEEK MEDICAL CARE IF:   You have increased pain, swelling, redness, fluid drainage, or bleeding.  You have muscle aches, chills, or a general ill feeling.  You have a fever. MAKE SURE YOU:   Understand these instructions.  Will watch your condition.  Will get help right away if you are not doing well or get worse. Document Released: 08/12/2005 Document Revised: 05/03/2012 Document Reviewed: 01/15/2012 [facility] Patient Information 2014 [place], [place].  Fingertip Infection When an infection is around the nail, it is called a paronychia. When it appears over the tip of the finger, it is called a felon. These infections are due to minor injuries or cracks in the skin. If they are not treated properly, they can lead to bone infection and permanent damage to the fingernail. Incision and drainage is necessary if a pus pocket (an abscess) has formed. Antibiotics and pain medicine may also be needed. Keep your hand elevated for the next 2-3 days to reduce swelling and pain. If a pack was placed in the abscess, it should be removed in 1-2 days by your caregiver. Soak the finger in warm water for 20 minutes 4 times daily to help promote drainage. Keep the hands as dry as possible. Wear protective gloves with cotton liners. See your caregiver for follow-up care as recommended.  HOME CARE INSTRUCTIONS   Keep wound clean, dry and dressed as suggested by your caregiver.  Soak in warm salt water for fifteen minutes, four times per day for bacterial infections.  Your caregiver will prescribe an antibiotic if a bacterial infection is suspected. Take antibiotics as directed and finish the prescription, even if the problem appears to be improving  before the medicine is gone.  Only take over-the-counter or prescription medicines for pain, discomfort, or fever as directed by your caregiver. SEEK IMMEDIATE MEDICAL CARE IF:  There is redness, swelling, or increasing pain in the wound.  Pus or any other unusual drainage is coming from the wound.  An unexplained oral temperature above 102 F (38.9 C) develops.  You notice a foul smell coming from the wound or dressing. MAKE SURE YOU:   Understand these instructions.  Monitor your condition.  Contact your caregiver if you are getting worse or not improving. Document Released: 12/10/2004 Document Revised: 01/25/2012 Document Reviewed: 12/06/2008 [facility] Patient Information 2014 [place], [place].  Nosebleed Nosebleeds can be caused by many conditions including trauma, infections, polyps, foreign bodies, dry mucous membranes or climate, medications and air conditioning. Most nosebleeds occur in the front of the nose. It is because of this location that most nosebleeds can be controlled by pinching the nostrils gently and continuously. Do this for at least 10 to 20 minutes. The reason for this long continuous pressure is that you must hold it long enough for the blood to clot. If during that 10 to 20 minute time period, pressure is released, the process may have to be started again. The nosebleed may stop by itself, quit with pressure, need concentrated heating (cautery) or stop with pressure from packing. HOME CARE INSTRUCTIONS   If your nose was packed, try to maintain the pack inside until your caregiver removes it. If a gauze pack was used and it starts to fall out, gently replace or cut the end off. Do not cut if a balloon catheter was used to pack the nose. Otherwise, do not remove unless instructed.  Avoid blowing your nose for 12 hours after treatment. This could dislodge the pack or clot and start bleeding again.  If the bleeding starts again, sit up and bending forward, gently  pinch the front half of your nose continuously for 20 minutes.  If bleeding was caused by dry mucous membranes, cover the inside of your nose every morning with a petroleum or antibiotic ointment. Use your little fingertip as an applicator. Do this as needed during dry weather. This will keep the mucous membranes moist and allow them to heal.  Maintain humidity in your home by using less air conditioning or using a humidifier.  Do not use aspirin or medications which make bleeding more likely. Your caregiver can give you recommendations on this.  Resume normal activities as able but try to avoid straining, lifting or bending at the waist for several days.  If the nosebleeds become recurrent and the cause is unknown, your caregiver may suggest laboratory tests. SEEK IMMEDIATE MEDICAL CARE IF:   Bleeding recurs and cannot be controlled.  There is unusual bleeding from or bruising on other parts of the body.  You have a fever.  Nosebleeds continue.  There is any worsening of the condition which originally brought you in.  You become lightheaded, feel faint, become sweaty or vomit blood. MAKE SURE YOU:   Understand these instructions.  Will watch your condition.  Will get help right away if you are  not doing well or get worse. Document Released: 08/12/2005 Document Revised: 01/25/2012 Document Reviewed: 10/04/2009 [facility] Patient Information 2014 [place], [place].  Neutropenia Neutropenia is a condition that occurs when the level of a certain type of white blood cell (neutrophil) in your body becomes lower than normal. Neutrophils are made in the bone marrow and fight infections. These cells protect against bacteria and viruses. The fewer neutrophils you have, and the longer your body remains without them, the greater your risk of getting a severe infection becomes. CAUSES  The cause of neutropenia may be hard to determine. However, it is usually due to 3 main problems:   Decreased  production of neutrophils. This may be due to:  Certain medicines such as chemotherapy.  Genetic problems.  Cancer.  Radiation treatments.  Vitamin deficiency.  Some pesticides.  Increased destruction of neutrophils. This may be due to:  Overwhelming infections.  Hemolytic anemia. This is when the body destroys its own blood cells.  Chemotherapy.  Neutrophils moving to areas of the body where they cannot fight infections. This may be due to:  Dialysis procedures.  Conditions where the spleen becomes enlarged. Neutrophils are held in the spleen and are not available to the rest of the body.  Overwhelming infections. The neutrophils are held in the area of the infection and are not available to the rest of the body. SYMPTOMS  There are no specific symptoms of neutropenia. The lack of neutrophils can result in an infection, and an infection can cause various problems. DIAGNOSIS  Diagnosis is made by a blood test. A complete blood count is performed. The normal level of neutrophils in human blood differs with age and race. Infants have lower counts than older children and adults. African Americans have lower counts than Caucasians or Asians. The average adult level is 1500 cells/mm3 of blood. Neutrophil counts are interpreted as follows:  Greater than 1000 cells/mm3 gives normal protection against infection.  500 to 1000 cells/mm3 gives an increased risk for infection.  200 to 500 cells/mm3 is a greater risk for severe infection.  Lower than 200 cells/mm3 is a marked risk of infection. This may require hospitalization and treatment with antibiotic medicines. TREATMENT  Treatment depends on the underlying cause, severity, and presence of infections or symptoms. It also depends on your health. Your caregiver will discuss the treatment plan with you. Mild cases are often easily treated and have a good outcome. Preventative measures may also be started to limit your risk of  infections. Treatment can include:  Taking antibiotics.  Stopping medicines that are known to cause neutropenia.  Correcting nutritional deficiencies by eating green vegetables to supply folic acid and taking vitamin B supplements.  Stopping exposure to pesticides if your neutropenia is related to pesticide exposure.  Taking a blood growth factor called sargramostim, pegfilgrastim, or filgrastim if you are undergoing chemotherapy for cancer. This stimulates white blood cell production.  Removal of the spleen if you have Felty's syndrome and have repeated infections. HOME CARE INSTRUCTIONS   Follow your caregiver's instructions about when you need to have blood work done.  Wash your hands often. Make sure others who come in contact with you also wash their hands.  Wash raw fruits and vegetables before eating them. They can carry bacteria and fungi.  Avoid people with colds or spreadable (contagious) diseases (chickenpox, herpes zoster, influenza).  Avoid large crowds.  Avoid construction areas. The dust can release fungus into the air.  Be cautious around children in daycare or school  environments.  Take care of your respiratory system by coughing and deep breathing.  Bathe daily.  Protect your skin from cuts and burns.  Do not work in the garden or with flowers and plants.  Care for the mouth before and after meals by brushing with a soft toothbrush. If you have mucositis, do not use mouthwash. Mouthwash contains alcohol and can dry out the mouth even more.  Clean the area between the genitals and the anus (perineal area) after urination and bowel movements. Women need to wipe from front to back.  Use a water soluble lubricant during sexual intercourse and practice good hygiene after. Do not have intercourse if you are severely neutropenic. Check with your caregiver for guidelines.  Exercise daily as tolerated.  Avoid people who were vaccinated with a live vaccine in the  past 30 days. You should not receive live vaccines (polio, typhoid).  Do not provide direct care for pets. Avoid animal droppings. Do not clean litter boxes and bird cages.  Do not share food utensils.  Do not use tampons, enemas, or rectal suppositories unless directed by your caregiver.  Use an electric razor to remove hair.  Wash your hands after handling magazines, letters, and newspapers. SEEK IMMEDIATE MEDICAL CARE IF:   You have a fever.  You have chills or start to shake.  You feel nauseous or vomit.  You develop mouth sores.  You develop aches and pains.  You have redness and swelling around open wounds.  Your skin is warm to the touch.  You have pus coming from your wounds.  You develop swollen lymph nodes.  You feel weak or fatigued.  You develop red streaks on the skin. MAKE SURE YOU:  Understand these instructions.  Will watch your condition.  Will get help right away if you are not doing well or get worse. Document Released: 04/24/2002 Document Revised: 01/25/2012 Document Reviewed: 05/22/2011 [facility] Patient Information 2014 [place], [place].  Platelet Transfusion Information This is information about transfusions of platelets. Platelets are tiny cells made by the bone marrow and found in the blood. When a blood vessel is damaged platelets rush to the damaged area to help form a clot. This begins the healing process. When platelets get very low your blood may have trouble clotting. This may be from:  Illness.  Blood disorder.  Chemotherapy to treat cancer. Often lower platelet counts do not usually cause problems.  Platelets usually last for 7 to 10 days. If they are not used not used in an injury, they are broken down by the liver or spleen. Symptoms of low platelet count include:  Nosebleeds.  Bleeding gums.  Heavy periods.  Bruising and tiny blood spots in the skin.  Pin point spots of bleeding are called (petechiae).  Larger bruises  (purpura).  Bleeding can be more serious if it happens in the brain or bowel. Platelet transfusions are often used to keep the platelet count at an acceptable level. Serious bleeding due to low platelets is uncommon. RISKS AND COMPLICATIONS Severe side effects from platelet transfusions are uncommon. Minor reactions may include:  Itching.  Rashes.  High temperature and shivering. Medications are available to stop transfusion reactions. Let your caregivers know if you develop any of the above problems.  If you are having platelet transfusions frequently they may get less effective. This is called becoming refractory to platelets. It is uncommon. This can happen from non-immune causes and immune causes. Non-immune causes include:  High temperatures.  Some medications.  An enlarged spleen. Immune causes happen when your body discovers the platelets are not your own and begin making antibodies against them. The antibodies kill the platelets quickly. Even with platelet transfusions you may still notice problems with bleeding or bruising. Let your caregivers know about this. Other things can be done to help if this happens.  BEFORE THE PROCEDURE   Your doctors will check your platelet count regularly.  If the platelet count is too low it may be necessary to have a platelet transfusion.  This is more important before certain procedures with a risk of bleeding such as a spinal tap.  Platelet transfusion reduces the risk of bleeding during or after the procedure.  Except in emergencies, giving a transfusion requires a written consent. Before blood is taken from a donor, a complete history is taken to make sure the person has no history of previous diseases, nor engages in risky social behavior. Examples of this are intravenous drug use or sexual activity with multiple partners. This could lead to infected blood or blood products being used. This history is done even in spite of the extensive  testing to make sure the blood is safe. All blood products transfused are tested to make sure it is a match for the person getting the blood. It is also checked for infections. Blood is the safest it has ever been. The risk of getting an infection is very low. PROCEDURE  The platelets are stored in small plastic bags which are kept at a low temperature.  Each bag is called a unit and sometimes two units are given. They are given through an intravenous line by drip infusion over about one half hour.  Usually blood is collected from multiple people to get enough to transfuse.  Sometimes, the platelets are collected from a single person. This is done using a special machine that separates the platelets from the blood. The machine is called an apheresis machine. Platelets collected in this way are called apheresed platelets. Apheresed platelets reduce the risk of becoming sensitive to the platelets. This lowers the chances of having a transfusion reaction.  As it only takes a short time to give the platelets, this treatment can be given in an outpatients department. Platelets can also be given before or after other treatments. SEEK IMMEDIATE MEDICAL CARE IF: Any of the following symptoms over the next 12 hours or several days:  Shaking chills.  Fever with a temperature greater than 102 F (38.9 C) develops.  Back pain or muscle pain.  People around you feel you are not acting correctly, or you are confused.  Blood in the urine or bowel movements or bleeding from any place in your body.  Shortness of breath, or difficulty breathing.  Dizziness.  Fainting.  You break out in a rash or develop hives.  You have a decrease in the amount of urine you are putting out, or the urine turns a dark color or changes to pink, red, or brown.  A severe headache or stiff neck.  Bruising more easily. Document Released: 08/30/2007 Document Revised: 01/25/2012 Document Reviewed: 08/30/2007 [facility]  Patient Information 2014 [place].

## 2014-01-14 NOTE — Progress Notes (Signed)
Patient ID: Mercedes Barnes, female   DOB: 1961-05-08, 53 y.o.   MRN: [id number] Patient seen at bedside  Vital signs stable CBC    Component Value Date/Time   WBC 0.4* 01/14/2014 0416   WBC 0.5* 01/12/2014 0957   WBC 9.5 03/20/2009 1454   RBC 2.71* 01/14/2014 0416   RBC 3.77 01/12/2014 0957   HGB 8.2* 01/14/2014 0416   HGB 11.4* 01/12/2014 0957   HGB 11.6 03/20/2009 1454   HCT 23.7* 01/14/2014 0416   HCT 33.2* 01/12/2014 0957   HCT 33.9* 03/20/2009 1454   PLT 46* 01/14/2014 0416   PLT 11* 01/12/2014 0957   PLT 355 03/20/2009 1454   MCV 87.5 01/14/2014 0416   MCV 88.1 01/12/2014 0957   MCV 82 03/20/2009 1454   MCH 30.3 01/14/2014 0416   MCH 30.2 01/12/2014 0957   MCH 28.1 03/20/2009 1454   MCHC 34.6 01/14/2014 0416   MCHC 34.3 01/12/2014 0957   MCHC 34.1 03/20/2009 1454   RDW 17.6* 01/14/2014 0416   RDW 17.1* 01/12/2014 0957   RDW 14.9* 03/20/2009 1454   LYMPHSABS 0.1* 01/13/2014 1004   LYMPHSABS 0.2* 01/12/2014 0957   LYMPHSABS 1.3 03/20/2009 1454   MONOABS 0.0* 01/13/2014 1004   MONOABS 0.0* 01/12/2014 0957   EOSABS 0.0 01/13/2014 1004   EOSABS 0.0 01/12/2014 0957   EOSABS 0.1 03/20/2009 1454   BASOSABS 0.0 01/13/2014 1004   BASOSABS 0.0 01/12/2014 0957   BASOSABS 0.1 03/20/2009 1454        Patient consented for I&D of her right hand.  Her labs are improved I feel acceptable for the procedure. Her initial cultures show gram-positive cocci.  Procedure:     Patient was seen underwent alcohol and Betadine pre-scrub followed by intermetacarpal block about the middle ring and thumb digits. This was a lidocaine without epinephrine block.  Following this the patient had a Betadine scrub and paint performed.  Following this the patient underwent #1 irrigation and debridement skin and subcutaneous tissue and decompression of a deep abscess about the middle finger. And this was done with curet scissor and knife blade without difficulty. This was an excisional debridement of a deep abscess. This was done about the middle finger.  Showed obvious infection.  We performed nail plate removal as well about the middle finger. Following this patient underwent copious irrigation. #2 irrigation and debridement ring finger skin and subcutaneous tissue. This was a I and D. of a deep abscess about the ring finger. The nail plate was removed as well about the ring finger. Following this I then irrigated copiously.  Once this was complete we then performed irrigation and debridement of deep abscess about the thumb. This was performed without complicating feature and was an excisional debridement as well as deep abscess excision patient tolerated this well there were no complications.  The patient had full irrigation applied followed by placement of Xeroform gauze. The patient had obvious devitalized necrotic tissue about the thumb, middle finger, and ring finger.  I discussed his she and her family all issues.  At conclusion of the case there were  no complications and good refill to the digits with good hemostasis.  We'll monitor her closely. We'll go ahead and continue Cipro 500 twice a day and I would recommend OxyIR for pain.  I discussed this with the family and Dr.Rama.  Overall she looks very well. I've given the family my cell phone number and they will see me tomorrow just before Dr. Beryle Beams  It  was a pleasure to see her today  Coby Shrewsberry M.D.

## 2014-01-14 NOTE — Progress Notes (Signed)
PROGRESS NOTE   [name] MOQ:[id number] DOB: 1961-10-14 DOA: 01/13/2014 PCP: Abigail Miyamoto, MD  Brief narrative: Mercedes Barnes is an 53 y.o. 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 Beryle Beams who was admitted 01/13/14 with recurrent epistaxis and ongoing ischemic changes to her right hand.  Assessment/Plan: Principal Problem:  Paronychia  Patient was admitted for medical stabilization so that she can proceed with left hand surgery. Continue empiric antibiotics including vancomycin and Zosyn. Followup cultures. Active Problems:  Glioblastoma multiforme of temporal lobe  Recently referred to hospice. We'll notify Dr. Beryle Beams of the patient's admission. Continue Decadron.  Hypothyroid  Continue home dose of Synthroid.  Pancytopenia due to chemotherapy / Neutropenia  Family wants to minimize intervention but does want comfort measures. We'll transfuse platelets for treatment of symptomatic epistaxis. Hemoglobin stable with no current indication for blood transfusion.  Chemotherapy induced thrombocytopenia  Platelet infusion ordered. Platelet count 46 this morning. Epistaxis  Platelet infusion ordered, can use ice packs and Afrin nasal spray for active bleeding.  Cellulitis of nose, external  On vancomycin and Zosyn.  Ischemia of digits of hand  Seen by hand surgeon with plans for operative intervention and platelet count stable.  Hyperglycemia  Likely steroid induced. Given patient's desire for comfort, would avoid CBG checks and treatment. The patient and her husband confirms her  desire to avoid CBG checks, insulin. High blood pressure  Continue Norvasc.  Chemotherapy induced neutropenia  Neutropenic precautions.  DVT prophylaxis  SCDs if patient tolerates.   Code Status: DNR  Family Communication: Husband updated at bedside.  Disposition Plan: Home with hospice.    IV access:  Peripheral IV  Medical Consultants:  Dr. Amedeo Plenty, Orthopedics  Other Consultants:  None  Anti-infectives:  Vancomycin 01/13/14--->  Zosyn 01/13/14--->  Clindamycin 01/13/14---> 01/13/14  HPI/Subjective: Mercedes Barnes denies any pain, nausea or vomiting. She has a new skin lesion on her inner left thigh, but reports that it is not painful. Appetite fair.  Objective: Filed Vitals:   01/13/14 1524 01/13/14 1545 01/13/14 2155 01/14/14 0610  BP: 148/87 159/105 152/90 144/82  Pulse: 103 106 86 84  Temp:  98.1 F (36.7 C) 98.4 F (36.9 C) 98.2 F (36.8 C)  TempSrc:  Oral Oral Oral  Resp: 20 18 20 16   Height:  5' 3.6" (1.615 m)    Weight:  62.052 kg (136 lb 12.8 oz)    SpO2: 96% 96% 100% 97%    Intake/Output Summary (Last 24 hours) at 01/14/14 1037 Last data filed at 01/14/14 0900  Gross per 24 hour  Intake    876 ml  Output    200 ml  Net    676 ml    Exam: Gen:  NAD Cardiovascular:  Tachycardic, No M/R/G Respiratory:  Lungs CTAB Gastrointestinal:  Abdomen soft, NT/ND, + BS Extremities:  No C/E/C Skin: New blister on an erythematous base left inner thigh, approximately 1.5 cm, ischemic area to tip of nose unchanged.  Data Reviewed: Basic Metabolic Panel:  Recent Labs Lab 01/08/14 1116 01/13/14 1004  NA 136 135*  K 4.3 4.4  CL  --  101  CO2 21* 21  GLUCOSE 168*  280*  BUN 26.8* 25*  CREATININE 0.5* 0.50  CALCIUM 8.0* 7.9*   GFR Estimated Creatinine Clearance: 69.9 ml/min (by C-G formula based on Cr of 0.5). Liver Function Tests:  Recent Labs Lab 01/08/14 1116 February 07, 2014 1004  AST 17 17  ALT 40 57*  ALKPHOS 65 61  BILITOT 0.31 0.2*  PROT  5.0* 4.9*  ALBUMIN 2.0* 1.3*   Coagulation profile  Recent Labs Lab 02-07-2014 1004  INR 1.03    CBC:  Recent Labs Lab 01/08/14 1116 01/12/14 0957 2014-02-07 1004 01/14/14 0416  WBC 2.5* 0.5* 0.4* 0.4*  NEUTROABS 2.2 0.3* 0.3*  --   HGB 12.5 11.4* 10.1* 8.2*  HCT 36.4 33.2* 29.3* 23.7*  MCV 89.2 88.1 88.0 87.5  PLT 28 Platelet count confirmed by slide estimate* 11* 21* 46*   Microbiology Recent Results (from the past 240 hour(s))  TECHNOLOGIST REVIEW     Status: None   Collection Time    01/08/14 11:16 AM      Result Value Ref Range Status   Technologist Review rare meta   Final  GRAM STAIN     Status: None   Collection Time    02/07/14  1:54 PM      Result Value Ref Range Status   Specimen Description FINGER TIP   Final   Special Requests Immunocompromised   Final   Gram Stain     Final   Value: NO WBC SEEN     ABUNDANT GRAM POSITIVE COCCI     Gram Stain Report Called to,Read Back By and Verified With: B.DUNCAN RN AT 1610 ON [id number] BY C.BONGEL   Report Status 07-Feb-2014 FINAL   Final  WOUND CULTURE     Status: None   Collection Time    02-07-14  2:38 PM      Result Value Ref Range Status   Specimen Description FINGER TIP   Final   Special Requests Immunocompromised   Final   Gram Stain     Final   Value: RARE WBC PRESENT, PREDOMINANTLY PMN     NO SQUAMOUS EPITHELIAL CELLS SEEN     ABUNDANT GRAM POSITIVE COCCI     Gram Stain Report Called to,Read Back By and Verified With: Gram Stain Report Called to,Read Back By and Verified WithMagda Bernheim RN @ [phone] BY C BONGEL Performed by [facility]     Performed at [facility]   Culture     Final   Value: NO GROWTH 1 DAY     Performed at [facility]   Report Status PENDING   Incomplete     Procedures and Diagnostic Studies: Dg Hand Complete Right  02/07/2014   CLINICAL DATA:  Swelling  EXAM: RIGHT HAND - COMPLETE 3+ VIEW  COMPARISON:  None.  FINDINGS: Rings overlie the proximal  phalanx ring finger. Mild osteopenia. Negative for fracture, dislocation, or other acute bone abnormality. Small dorsal marginal spurs from the distal phalanges of the index, long, and little fingers. Regional soft tissues unremarkable.  IMPRESSION: 1. Negative for fracture or other acute abnormality.   Electronically Signed   By: Arne Cleveland M.D.   On: 02/07/14 12:34    Scheduled Meds: . sodium chloride  250 mL Intravenous Once  . amLODipine  10 mg Oral Daily  . dexamethasone  4 mg Oral 3 times per day  . [START ON 01/15/2014] levothyroxine  100 mcg Oral QAC breakfast  . lidocaine-EPINEPHrine  30 mL Infiltration Once  . piperacillin-tazobactam (  ZOSYN)  IV  3.375 g Intravenous 3 times per day  . sodium chloride  3 mL Intravenous Q12H  . vancomycin  750 mg Intravenous Q8H   Continuous Infusions:   Time spent: 35 minutes with > 50% of time discussing current diagnostic test results, clinical impression and plan of care with the patient and her husband.    LOS: 1 day   RAMA,CHRISTINA  [facility] Pager [phone]. If unable to reach me by pager, please call my cell phone at [phone].  *Please note that the hospitalists switch teams on Wednesdays. Please call the flow manager at [phone] if you are having difficulty reaching the hospitalist taking care of this patient as she can update you and provide the most up-to-date pager number of provider caring for the patient. If 8PM-8AM, please contact night-coverage at [email or web address], password [facility]  01/14/2014, 10:37 AM    **Disclaimer: This note was dictated with voice recognition software. Similar sounding words can inadvertently be transcribed and this note may contain transcription errors which may not have been corrected upon publication of note.**

## 2014-01-14 NOTE — Progress Notes (Signed)
   CARE MANAGEMENT NOTE 01/14/2014  Patient:  Mercedes Barnes, Mercedes Barnes   Account Number:  [id number]  Date Initiated:  01/14/2014  Documentation initiated by:  [facility]  Subjective/Objective Assessment:   glioblastoma brain cancer     Action/Plan:   lives with husband   Anticipated DC Date:  01/14/2014   Anticipated DC Plan:  [place]  CM consult      Choice offered to / List presented to:     DME arranged  HOSPITAL BED      DME agency  [place] [facility]   Status of service:  Completed, signed off Medicare Important Message given?   (If response is "NO", the following Medicare IM given date fields will be blank) Date Medicare IM given:   Date Additional Medicare IM given:    Discharge Disposition:  [place]  Per UR Regulation:    If discussed at Long Length of Stay Meetings, dates discussed:    Comments:  01/14/2014 1300 NCM received call from [facility] on call RN they have accepted referral. Spoke to pt and gave permission to speak to husband. Requesting hospital bed for home. Notified [facility] DME rep for hospital bed for home. Faxed orders, dc summary and facesheet to [facility].  Jonnie Finner RN CCM Case Mgmt phone [phone]

## 2014-01-15 ENCOUNTER — Other Ambulatory Visit: Payer: Medicare Other

## 2014-01-15 ENCOUNTER — Telehealth: Payer: Self-pay | Admitting: *Deleted

## 2014-01-15 LAB — PREPARE PLATELET PHERESIS
Unit division: 0
Unit division: 0

## 2014-01-15 NOTE — Telephone Encounter (Signed)
Received call from Sherry/Hospice/RN/[place] asking if pt was to receive avastin next week.  She states that they can't admit pt if she is to continue avastin.  Discussed with Dr Beryle Beams & informed no avastin for now.

## 2014-01-16 ENCOUNTER — Telehealth: Payer: Self-pay | Admitting: *Deleted

## 2014-01-16 LAB — WOUND CULTURE

## 2014-01-16 NOTE — Telephone Encounter (Signed)
Received vm call this am from Donna/RN/Hospice/[place] stating pt having indigestion related to the decadron & taken Tums which helps some but husband has supply of pantoprazole 40 mg & wants to know if this is OK to take,  She also states she is going to call in some [place]-s with standing orders.  She called back again & states pt wants DNR after talking with RN & SW & Butch Penny wants to know if OK to have one of the Hospice doctors to sign.  OK given for pantoprazole & DNR to be signed by hospice doc.

## 2014-01-17 LAB — WOUND CULTURE: GRAM STAIN: NONE SEEN

## 2014-01-18 LAB — ANAEROBIC CULTURE

## 2014-01-19 ENCOUNTER — Telehealth: Payer: Self-pay | Admitting: *Deleted

## 2014-01-19 NOTE — Telephone Encounter (Signed)
Received vm call from Donna/RN/Hospice/[place] asking if pt needs to come in Monday for labs/MD visit.  She states that they can do labs if needed.  Discussed with Dr Beryle Beams & talked with husband & he is OK with not getting labs/treatment & not seeing MD.  He states that it is hard to get her into the office.  He was concerned about the avastin & states that Dr. Beryle Beams said something about not stopping abruptly  & she received 1/2 of dose last time & thought she might need to get a reduced dose Monday.  He reports that she is not having any bleeding now but is definitely declining & is ultimately OK with not doing anything else right now unless Dr Beryle Beams wants something.  He states he is trying to do what is best for her.  Will cancel appts & will notify Butch Penny.

## 2014-01-22 ENCOUNTER — Other Ambulatory Visit: Payer: Self-pay | Admitting: Oncology

## 2014-01-22 ENCOUNTER — Other Ambulatory Visit: Payer: Medicare Other

## 2014-01-22 ENCOUNTER — Ambulatory Visit: Payer: Medicare Other

## 2014-01-22 ENCOUNTER — Telehealth: Payer: Self-pay | Admitting: *Deleted

## 2014-01-22 ENCOUNTER — Ambulatory Visit: Payer: Medicare Other | Admitting: Oncology

## 2014-01-22 NOTE — Telephone Encounter (Signed)
Received note that Hospice called & reported that pt passed away 2014-02-10 @ 9:27 pm.  Dr. Beryle Beams notified.

## 2014-02-05 ENCOUNTER — Other Ambulatory Visit: Payer: Medicare Other

## 2014-02-05 ENCOUNTER — Ambulatory Visit: Payer: Medicare Other

## 2014-02-05 ENCOUNTER — Ambulatory Visit: Payer: Medicare Other | Admitting: Nurse Practitioner

## 2014-02-14 DEATH — deceased

## 2014-07-12 NOTE — Telephone Encounter (Signed)
Encounter was telephone call.
